# Patient Record
Sex: Male | Born: 1965 | Race: White | Hispanic: No | Marital: Married | Smoking: Former smoker
Health system: Southern US, Community
[De-identification: ages and names within clinical notes are randomized; demographics above are authoritative.]

## PROBLEM LIST (undated history)

## (undated) DIAGNOSIS — F419 Anxiety disorder, unspecified: Secondary | ICD-10-CM

## (undated) DIAGNOSIS — D649 Anemia, unspecified: Secondary | ICD-10-CM

## (undated) DIAGNOSIS — M1990 Unspecified osteoarthritis, unspecified site: Secondary | ICD-10-CM

## (undated) DIAGNOSIS — I10 Essential (primary) hypertension: Secondary | ICD-10-CM

## (undated) DIAGNOSIS — M549 Dorsalgia, unspecified: Secondary | ICD-10-CM

## (undated) DIAGNOSIS — I2510 Atherosclerotic heart disease of native coronary artery without angina pectoris: Secondary | ICD-10-CM

## (undated) DIAGNOSIS — Z9889 Other specified postprocedural states: Secondary | ICD-10-CM

## (undated) DIAGNOSIS — R112 Nausea with vomiting, unspecified: Secondary | ICD-10-CM

## (undated) DIAGNOSIS — M069 Rheumatoid arthritis, unspecified: Secondary | ICD-10-CM

## (undated) DIAGNOSIS — Z9289 Personal history of other medical treatment: Secondary | ICD-10-CM

## (undated) DIAGNOSIS — E2740 Unspecified adrenocortical insufficiency: Secondary | ICD-10-CM

## (undated) DIAGNOSIS — G6181 Chronic inflammatory demyelinating polyneuritis: Secondary | ICD-10-CM

## (undated) HISTORY — PX: COLONOSCOPY: SHX174

## (undated) HISTORY — DX: Chronic inflammatory demyelinating polyneuritis: G61.81

## (undated) HISTORY — DX: Dorsalgia, unspecified: M54.9

## (undated) HISTORY — PX: CARPAL TUNNEL RELEASE: SHX101

## (undated) HISTORY — PX: BACK SURGERY: SHX140

---

## 1998-10-05 HISTORY — PX: CARPAL TUNNEL RELEASE: SHX101

## 1999-10-06 HISTORY — PX: HERNIA REPAIR: SHX51

## 2001-08-09 ENCOUNTER — Encounter: Payer: Self-pay | Admitting: Neurological Surgery

## 2001-08-09 ENCOUNTER — Ambulatory Visit (HOSPITAL_COMMUNITY): Admission: RE | Admit: 2001-08-09 | Discharge: 2001-08-09 | Payer: Self-pay | Admitting: Neurological Surgery

## 2001-08-16 ENCOUNTER — Inpatient Hospital Stay (HOSPITAL_COMMUNITY): Admission: RE | Admit: 2001-08-16 | Discharge: 2001-08-20 | Payer: Self-pay | Admitting: Neurological Surgery

## 2001-08-16 ENCOUNTER — Encounter: Payer: Self-pay | Admitting: Neurological Surgery

## 2004-09-16 ENCOUNTER — Ambulatory Visit: Payer: Self-pay | Admitting: Family Medicine

## 2004-10-27 ENCOUNTER — Ambulatory Visit: Payer: Self-pay | Admitting: Family Medicine

## 2004-11-27 ENCOUNTER — Ambulatory Visit: Payer: Self-pay | Admitting: Family Medicine

## 2008-07-16 HISTORY — PX: COLONOSCOPY: SHX174

## 2008-07-16 HISTORY — PX: ESOPHAGOGASTRODUODENOSCOPY: SHX1529

## 2012-10-05 DIAGNOSIS — Z9289 Personal history of other medical treatment: Secondary | ICD-10-CM

## 2012-10-05 HISTORY — DX: Personal history of other medical treatment: Z92.89

## 2014-04-11 ENCOUNTER — Other Ambulatory Visit (HOSPITAL_COMMUNITY): Payer: Self-pay | Admitting: Rheumatology

## 2014-04-11 ENCOUNTER — Ambulatory Visit (HOSPITAL_COMMUNITY)
Admission: RE | Admit: 2014-04-11 | Discharge: 2014-04-11 | Disposition: A | Discharge status: Home / Self Care | Payer: BC Managed Care – PPO | Source: Ambulatory Visit | Attending: Rheumatology | Admitting: Rheumatology

## 2014-04-11 DIAGNOSIS — M7989 Other specified soft tissue disorders: Secondary | ICD-10-CM | POA: Insufficient documentation

## 2014-04-11 DIAGNOSIS — R52 Pain, unspecified: Secondary | ICD-10-CM

## 2014-04-11 IMAGING — CR DG CHEST 2V
2 series · 2 of 2 positions shown · non-contrast
Comparison: Single view of the chest [DATE] and [DATE].

CLINICAL DATA: Left hand swelling.  Screening prior drug therapy.

EXAM:
CHEST  2 VIEW

[w chest pa]
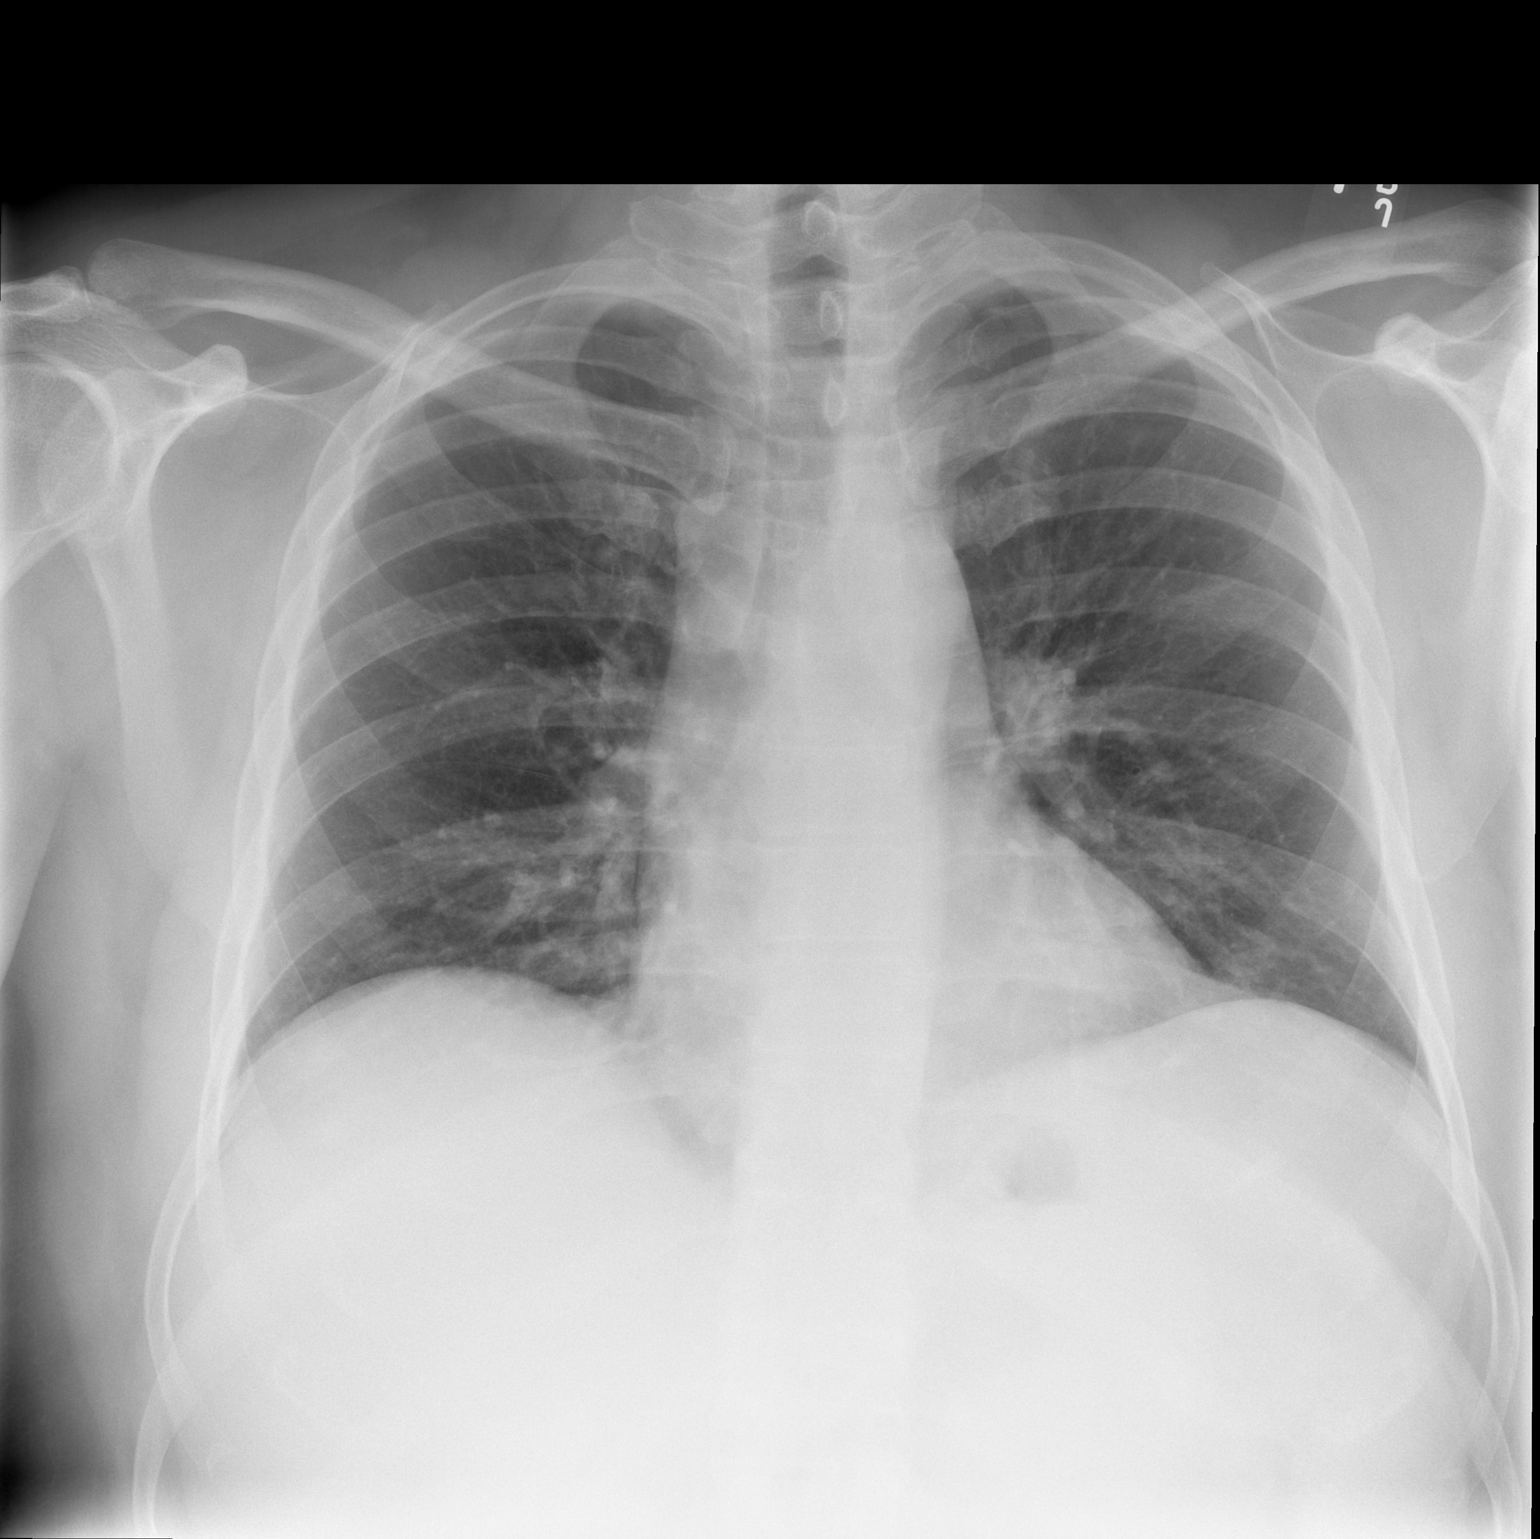

[w chest lat]
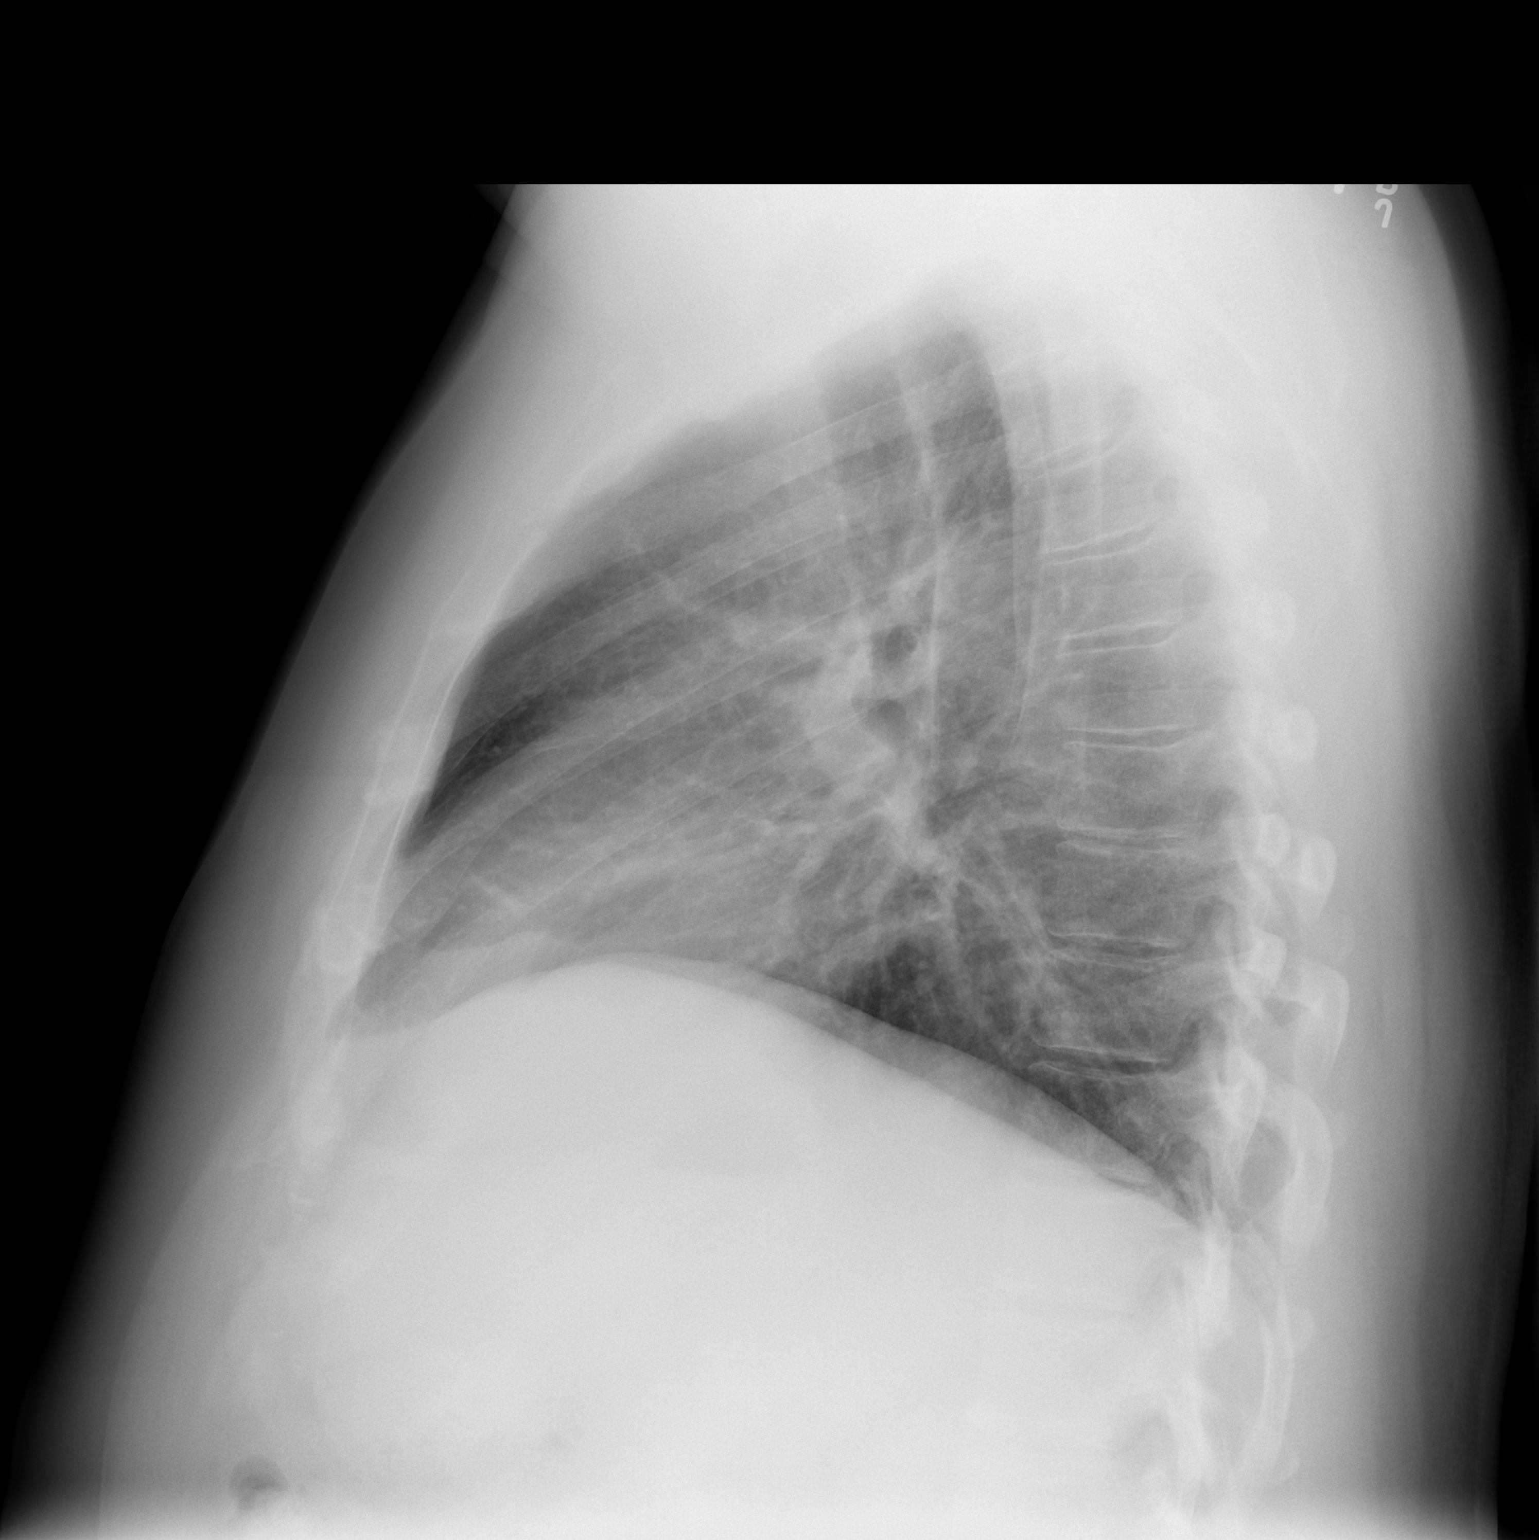

[2 of 2 positions shown; findings below may reference images not displayed]

FINDINGS: Heart size and mediastinal contours are within normal limits. Both
lungs are clear. Visualized skeletal structures are unremarkable.
IMPRESSION: Negative exam.

## 2015-10-28 ENCOUNTER — Other Ambulatory Visit: Payer: Self-pay | Admitting: Neurological Surgery

## 2015-12-09 ENCOUNTER — Other Ambulatory Visit (HOSPITAL_COMMUNITY): Payer: Self-pay | Admitting: *Deleted

## 2015-12-09 ENCOUNTER — Encounter (HOSPITAL_COMMUNITY): Payer: Self-pay

## 2015-12-09 ENCOUNTER — Encounter (HOSPITAL_COMMUNITY)
Admission: RE | Admit: 2015-12-09 | Discharge: 2015-12-09 | Disposition: A | Discharge status: Home / Self Care | Payer: BLUE CROSS/BLUE SHIELD | Source: Ambulatory Visit | Attending: Neurological Surgery | Admitting: Neurological Surgery

## 2015-12-09 DIAGNOSIS — Z0183 Encounter for blood typing: Secondary | ICD-10-CM | POA: Diagnosis not present

## 2015-12-09 DIAGNOSIS — Z01812 Encounter for preprocedural laboratory examination: Secondary | ICD-10-CM | POA: Diagnosis not present

## 2015-12-09 DIAGNOSIS — M4806 Spinal stenosis, lumbar region: Secondary | ICD-10-CM | POA: Diagnosis not present

## 2015-12-09 HISTORY — DX: Anxiety disorder, unspecified: F41.9

## 2015-12-09 HISTORY — DX: Personal history of other medical treatment: Z92.89

## 2015-12-09 HISTORY — DX: Unspecified osteoarthritis, unspecified site: M19.90

## 2015-12-09 LAB — CBC
HCT: 46.7 % (ref 39.0–52.0)
HEMOGLOBIN: 15.2 g/dL (ref 13.0–17.0)
MCH: 28 pg (ref 26.0–34.0)
MCHC: 32.5 g/dL (ref 30.0–36.0)
MCV: 86.2 fL (ref 78.0–100.0)
PLATELETS: 221 10*3/uL (ref 150–400)
RBC: 5.42 MIL/uL (ref 4.22–5.81)
RDW: 13.1 % (ref 11.5–15.5)
WBC: 7 10*3/uL (ref 4.0–10.5)

## 2015-12-09 LAB — BASIC METABOLIC PANEL
Anion gap: 11 (ref 5–15)
BUN: 16 mg/dL (ref 6–20)
CHLORIDE: 109 mmol/L (ref 101–111)
CO2: 23 mmol/L (ref 22–32)
CREATININE: 0.93 mg/dL (ref 0.61–1.24)
Calcium: 9.2 mg/dL (ref 8.9–10.3)
GFR calc Af Amer: >60 mL/min (ref >60)
GFR calc non Af Amer: >60 mL/min (ref >60)
Glucose, Bld: 99 mg/dL (ref 65–99)
Potassium: 4.1 mmol/L (ref 3.5–5.1)
Sodium: 143 mmol/L (ref 135–145)

## 2015-12-09 LAB — TYPE AND SCREEN
ABO/RH(D): A POS
Antibody Screen: NEGATIVE

## 2015-12-09 LAB — SURGICAL PCR SCREEN
MRSA, PCR: NEGATIVE
Staphylococcus aureus: NEGATIVE

## 2015-12-09 LAB — ABO/RH: ABO/RH(D): A POS

## 2015-12-09 NOTE — Progress Notes (Signed)
PCP- /w [facility], Dr. Salvadore Dom, Rheumatology -Dr. Fatima Sanger, pt. Presented to ED with chest pain in 2014, kept one night & had stress /echo- told it was normal, no further problems or f/u with cardiac.

## 2015-12-09 NOTE — Pre-Procedure Instructions (Signed)
Gregory Hinton  12/09/2015      [facility] - [place], [place] - [address]  [place] [address] Phone: [phone] Fax: [phone]    Your procedure is scheduled on : 12/17/2015.  Report to [facility] Admitting at 5:30 A.M.  Call this number if you have problems the morning of surgery:  [phone]   Remember:  Do not eat food or drink liquids after midnight.  On Monday   Take these medicines the morning of surgery with A SIP OF WATER: Wellbutrin    Do not wear jewelry   Do not wear lotions, powders, or perfumes.  You may wear deodorant.             Men may shave face and neck.   Do not bring valuables to the hospital.   [facility] is not responsible for any belongings or valuables.  Contacts, dentures or bridgework may not be worn into surgery.  Leave your suitcase in the car.  After surgery it may be brought to your room.  For patients admitted to the hospital, discharge time will be determined by your treatment team.  Patients discharged the day of surgery will not be allowed to drive home.   Name and phone number of your driver:   With wife  Special instructions:  Special Instructions: [place] - Preparing for Surgery  Before surgery, you can play an important role.  Because skin is not sterile, your skin needs to be as free of germs as possible.  You can reduce the number of germs on you skin by washing with CHG (chlorahexidine gluconate) soap before surgery.  CHG is an antiseptic cleaner which kills germs and bonds with the skin to continue killing germs even after washing.  Please DO NOT use if you have an allergy to CHG or antibacterial soaps.  If your skin becomes reddened/irritated stop using the CHG and inform your nurse when you arrive at Short Stay.  Do not shave (including legs and underarms) for at least 48 hours prior to the first CHG shower.  You may shave your face.  Please follow  these instructions carefully:   1.  Shower with CHG Soap the night before surgery and the  morning of Surgery.  2.  If you choose to wash your hair, wash your hair first as usual with your  normal shampoo.  3.  After you shampoo, rinse your hair and body thoroughly to remove the  Shampoo.  4.  Use CHG as you would any other liquid soap.  You can apply chg directly to the skin and wash gently with scrungie or a clean washcloth.  5.  Apply the CHG Soap to your body ONLY FROM THE NECK DOWN.    Do not use on open wounds or open sores.  Avoid contact with your eyes, ears, mouth and genitals (private parts).  Wash genitals (private parts)   with your normal soap.  6.  Wash thoroughly, paying special attention to the area where your surgery will be performed.  7.  Thoroughly rinse your body with warm water from the neck down.  8.  DO NOT shower/wash with your normal soap after using and rinsing off   the CHG Soap.  9.  Pat yourself dry with a clean towel.            10.  Wear clean pajamas.  11.  Place clean sheets on your bed the night of your first shower and do not sleep with pets.  Day of Surgery  Do not apply any lotions/deodorants the morning of surgery.  Please wear clean clothes to the hospital/surgery center.  Please read over the following fact sheets that you were given. Pain Booklet, Coughing and Deep Breathing, Blood Transfusion Information, MRSA Information and Surgical Site Infection Prevention

## 2015-12-16 MED ORDER — CEFAZOLIN SODIUM-DEXTROSE 2-3 GM-% IV SOLR
2.0000 g | INTRAVENOUS | Status: AC
  Administered 2015-12-17: 2 g via INTRAVENOUS
  Filled 2015-12-16: qty 50

## 2015-12-16 NOTE — Anesthesia Preprocedure Evaluation (Addendum)
Anesthesia Evaluation  Patient identified by MRN, date of birth, ID band Patient awake    Reviewed: Allergy & Precautions, NPO status , Patient's Chart, lab work & pertinent test results  Airway Mallampati: II   Neck ROM: Full    Dental  (+) Teeth Intact, Dental Advisory Given   Pulmonary former smoker (quit 2013),    breath sounds clear to auscultation       Cardiovascular  Rhythm:Regular  Stress neg 2014   Neuro/Psych Anxiety negative neurological ROS     GI/Hepatic negative GI ROS, Neg liver ROS,   Endo/Other  negative endocrine ROS  Renal/GU negative Renal ROS  negative genitourinary   Musculoskeletal  (+) Arthritis , Rheumatoid disorders,    Abdominal   Peds negative pediatric ROS (+)  Hematology negative hematology ROS (+) 15/46   Anesthesia Other Findings   Reproductive/Obstetrics negative OB ROS                           Anesthesia Physical Anesthesia Plan  ASA: II  Anesthesia Plan: General   Post-op Pain Management:    Induction: Intravenous  Airway Management Planned: Oral ETT  Additional Equipment:   Intra-op Plan:   Post-operative Plan: Extubation in OR  Informed Consent: I have reviewed the patients History and Physical, chart, labs and discussed the procedure including the risks, benefits and alternatives for the proposed anesthesia with the patient or authorized representative who has indicated his/her understanding and acceptance.     Plan Discussed with:   Anesthesia Plan Comments: (Allergy to Eggs causes GI issues, )        Anesthesia Quick Evaluation

## 2015-12-17 ENCOUNTER — Inpatient Hospital Stay (HOSPITAL_COMMUNITY): Payer: BLUE CROSS/BLUE SHIELD | Admitting: Anesthesiology

## 2015-12-17 ENCOUNTER — Encounter (HOSPITAL_COMMUNITY)
Admission: RE | Disposition: A | Discharge status: Home / Self Care | Payer: Self-pay | Source: Ambulatory Visit | Attending: Neurological Surgery

## 2015-12-17 ENCOUNTER — Inpatient Hospital Stay (HOSPITAL_COMMUNITY): Payer: BLUE CROSS/BLUE SHIELD

## 2015-12-17 ENCOUNTER — Inpatient Hospital Stay (HOSPITAL_COMMUNITY)
Admission: RE | Admit: 2015-12-17 | Discharge: 2015-12-19 | DRG: 460 | Disposition: A | Discharge status: Home / Self Care | Payer: BLUE CROSS/BLUE SHIELD | Source: Ambulatory Visit | Attending: Neurological Surgery | Admitting: Neurological Surgery

## 2015-12-17 ENCOUNTER — Encounter (HOSPITAL_COMMUNITY): Payer: Self-pay | Admitting: *Deleted

## 2015-12-17 DIAGNOSIS — Z91018 Allergy to other foods: Secondary | ICD-10-CM

## 2015-12-17 DIAGNOSIS — F419 Anxiety disorder, unspecified: Secondary | ICD-10-CM | POA: Diagnosis present

## 2015-12-17 DIAGNOSIS — Z79899 Other long term (current) drug therapy: Secondary | ICD-10-CM

## 2015-12-17 DIAGNOSIS — Z7952 Long term (current) use of systemic steroids: Secondary | ICD-10-CM

## 2015-12-17 DIAGNOSIS — M069 Rheumatoid arthritis, unspecified: Secondary | ICD-10-CM | POA: Diagnosis present

## 2015-12-17 DIAGNOSIS — Z87891 Personal history of nicotine dependence: Secondary | ICD-10-CM

## 2015-12-17 DIAGNOSIS — Z419 Encounter for procedure for purposes other than remedying health state, unspecified: Secondary | ICD-10-CM

## 2015-12-17 DIAGNOSIS — M4806 Spinal stenosis, lumbar region: Principal | ICD-10-CM | POA: Diagnosis present

## 2015-12-17 DIAGNOSIS — M48062 Spinal stenosis, lumbar region with neurogenic claudication: Secondary | ICD-10-CM | POA: Diagnosis present

## 2015-12-17 HISTORY — DX: Rheumatoid arthritis, unspecified: M06.9

## 2015-12-17 IMAGING — RF DG LUMBAR SPINE 2-3V
1 series · 2 of 2 positions shown · non-contrast
Comparison: None.

CLINICAL DATA: L3-4 posterior interbody fusion lumbar spine.

EXAM:
LUMBAR SPINE - 2-3 VIEW; DG C-ARM 61-120 MIN fluoroscopic time 27
seconds.

[Series 1: run · 2 of 2 slices shown]
[im 1/2]
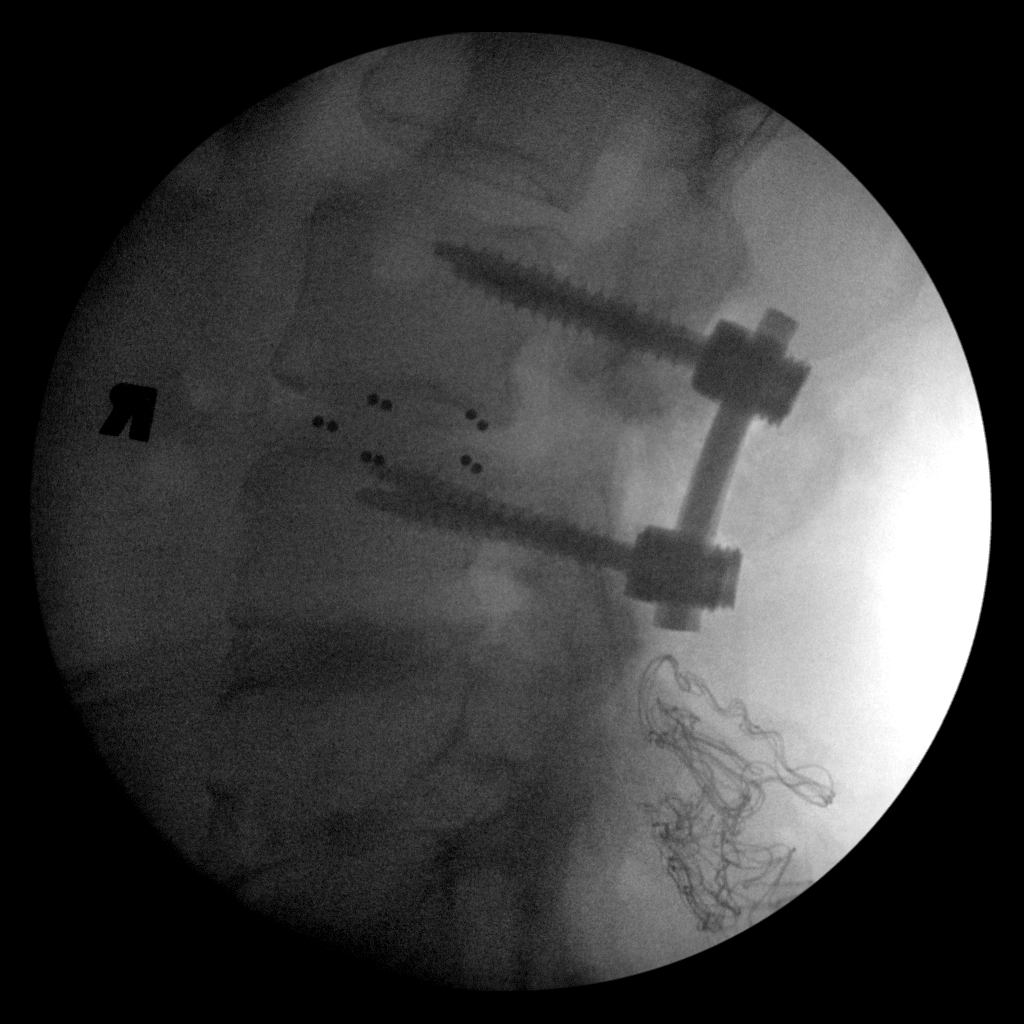
[im 2/2]
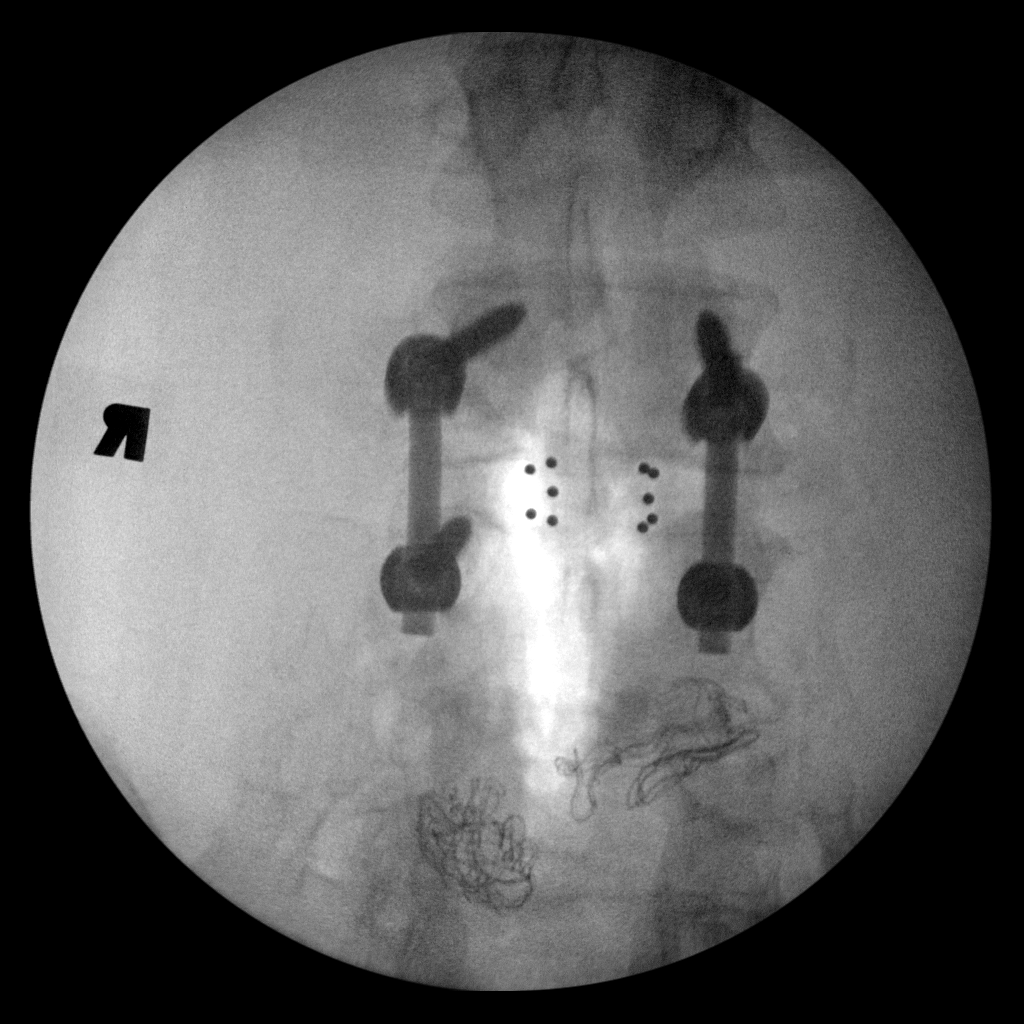

[2 of 2 positions shown; findings below may reference images not displayed]

FINDINGS: Patient status post posterior fusion of the lumbar spine presumably
at 3-4 per history without malalignment.
IMPRESSION: Fluoroscopic images demonstrating posterior fusion of lumbar spine
without malalignment.

## 2015-12-17 SURGERY — POSTERIOR LUMBAR FUSION 1 LEVEL
Anesthesia: General | Site: Spine Lumbar

## 2015-12-17 MED ORDER — DEXAMETHASONE SODIUM PHOSPHATE 10 MG/ML IJ SOLN
INTRAMUSCULAR | Status: DC | PRN
  Administered 2015-12-17: 10 mg via INTRAVENOUS

## 2015-12-17 MED ORDER — ONDANSETRON HCL 4 MG/2ML IJ SOLN: 4.0000 mg | INTRAMUSCULAR | Status: DC | PRN

## 2015-12-17 MED ORDER — PROPOFOL 10 MG/ML IV BOLUS
INTRAVENOUS | Status: DC | PRN
  Administered 2015-12-17: 200 mg via INTRAVENOUS

## 2015-12-17 MED ORDER — ALBUMIN HUMAN 5 % IV SOLN
INTRAVENOUS | Status: DC | PRN
  Administered 2015-12-17: 10:00:00 via INTRAVENOUS

## 2015-12-17 MED ORDER — SODIUM CHLORIDE 0.9 % IR SOLN
Status: DC | PRN
  Administered 2015-12-17: 500 mL

## 2015-12-17 MED ORDER — ROCURONIUM BROMIDE 50 MG/5ML IV SOLN
INTRAVENOUS | Status: AC
  Filled 2015-12-17: qty 1

## 2015-12-17 MED ORDER — PROMETHAZINE HCL 25 MG/ML IJ SOLN
6.2500 mg | INTRAMUSCULAR | Status: DC | PRN
  Administered 2015-12-17: 6.25 mg via INTRAVENOUS

## 2015-12-17 MED ORDER — FENTANYL CITRATE (PF) 100 MCG/2ML IJ SOLN
INTRAMUSCULAR | Status: AC
  Filled 2015-12-17: qty 2

## 2015-12-17 MED ORDER — LIDOCAINE HCL (CARDIAC) 20 MG/ML IV SOLN
INTRAVENOUS | Status: DC | PRN
  Administered 2015-12-17: 100 mg via INTRAVENOUS

## 2015-12-17 MED ORDER — PROPOFOL 10 MG/ML IV BOLUS
INTRAVENOUS | Status: AC
  Filled 2015-12-17: qty 20

## 2015-12-17 MED ORDER — PROMETHAZINE HCL 25 MG/ML IJ SOLN
INTRAMUSCULAR | Status: AC
  Filled 2015-12-17: qty 1

## 2015-12-17 MED ORDER — PHENOL 1.4 % MT LIQD: 1.0000 | OROMUCOSAL | Status: DC | PRN

## 2015-12-17 MED ORDER — LIDOCAINE-EPINEPHRINE 1 %-1:100000 IJ SOLN
INTRAMUSCULAR | Status: DC | PRN
  Administered 2015-12-17: 5 mL

## 2015-12-17 MED ORDER — POLYETHYLENE GLYCOL 3350 17 G PO PACK: 17.0000 g | PACK | Freq: Every day | ORAL | Status: DC | PRN

## 2015-12-17 MED ORDER — SUCCINYLCHOLINE CHLORIDE 20 MG/ML IJ SOLN
INTRAMUSCULAR | Status: DC | PRN
  Administered 2015-12-17: 140 mg via INTRAVENOUS

## 2015-12-17 MED ORDER — HYDROCODONE-ACETAMINOPHEN 5-325 MG PO TABS: 1.0000 | ORAL_TABLET | ORAL | Status: DC | PRN

## 2015-12-17 MED ORDER — BISACODYL 10 MG RE SUPP: 10.0000 mg | Freq: Every day | RECTAL | Status: DC | PRN

## 2015-12-17 MED ORDER — KETOROLAC TROMETHAMINE 30 MG/ML IJ SOLN
INTRAMUSCULAR | Status: AC
  Administered 2015-12-17: 15 mg
  Filled 2015-12-17: qty 1

## 2015-12-17 MED ORDER — METHOCARBAMOL 500 MG PO TABS
500.0000 mg | ORAL_TABLET | Freq: Four times a day (QID) | ORAL | Status: DC | PRN
  Administered 2015-12-18 – 2015-12-19 (×5): 500 mg via ORAL
  Filled 2015-12-17 (×5): qty 1

## 2015-12-17 MED ORDER — GLYCOPYRROLATE 0.2 MG/ML IJ SOLN
INTRAMUSCULAR | Status: AC
  Filled 2015-12-17: qty 2

## 2015-12-17 MED ORDER — SUCCINYLCHOLINE CHLORIDE 20 MG/ML IJ SOLN
INTRAMUSCULAR | Status: AC
  Filled 2015-12-17: qty 1

## 2015-12-17 MED ORDER — ALUM & MAG HYDROXIDE-SIMETH 200-200-20 MG/5ML PO SUSP: 30.0000 mL | Freq: Four times a day (QID) | ORAL | Status: DC | PRN

## 2015-12-17 MED ORDER — SODIUM CHLORIDE 0.9% FLUSH
3.0000 mL | Freq: Two times a day (BID) | INTRAVENOUS | Status: DC
  Administered 2015-12-17 – 2015-12-18 (×2): 3 mL via INTRAVENOUS

## 2015-12-17 MED ORDER — SENNA 8.6 MG PO TABS
1.0000 | ORAL_TABLET | Freq: Two times a day (BID) | ORAL | Status: DC
  Administered 2015-12-17 – 2015-12-19 (×4): 8.6 mg via ORAL
  Filled 2015-12-17 (×4): qty 1

## 2015-12-17 MED ORDER — BUPROPION HCL ER (XL) 300 MG PO TB24
300.0000 mg | ORAL_TABLET | Freq: Every day | ORAL | Status: DC
  Administered 2015-12-18 – 2015-12-19 (×2): 300 mg via ORAL
  Filled 2015-12-17 (×3): qty 1

## 2015-12-17 MED ORDER — FENTANYL CITRATE (PF) 250 MCG/5ML IJ SOLN
INTRAMUSCULAR | Status: AC
  Filled 2015-12-17: qty 5

## 2015-12-17 MED ORDER — VECURONIUM BROMIDE 10 MG IV SOLR
INTRAVENOUS | Status: AC
  Filled 2015-12-17: qty 10

## 2015-12-17 MED ORDER — LACTATED RINGERS IV SOLN
INTRAVENOUS | Status: DC | PRN
  Administered 2015-12-17 (×3): via INTRAVENOUS

## 2015-12-17 MED ORDER — STERILE WATER FOR INJECTION IJ SOLN
INTRAMUSCULAR | Status: AC
  Filled 2015-12-17: qty 10

## 2015-12-17 MED ORDER — METHOCARBAMOL 1000 MG/10ML IJ SOLN
500.0000 mg | Freq: Four times a day (QID) | INTRAVENOUS | Status: DC | PRN
  Filled 2015-12-17: qty 5

## 2015-12-17 MED ORDER — ONDANSETRON HCL 4 MG/2ML IJ SOLN
INTRAMUSCULAR | Status: AC
  Filled 2015-12-17: qty 2

## 2015-12-17 MED ORDER — ACETAMINOPHEN 325 MG PO TABS: 650.0000 mg | ORAL_TABLET | ORAL | Status: DC | PRN

## 2015-12-17 MED ORDER — PHENYLEPHRINE 40 MCG/ML (10ML) SYRINGE FOR IV PUSH (FOR BLOOD PRESSURE SUPPORT)
PREFILLED_SYRINGE | INTRAVENOUS | Status: AC
  Filled 2015-12-17: qty 10

## 2015-12-17 MED ORDER — NEOSTIGMINE METHYLSULFATE 10 MG/10ML IV SOLN
INTRAVENOUS | Status: AC
  Filled 2015-12-17: qty 1

## 2015-12-17 MED ORDER — SODIUM CHLORIDE 0.9% FLUSH: 3.0000 mL | INTRAVENOUS | Status: DC | PRN

## 2015-12-17 MED ORDER — MIDAZOLAM HCL 5 MG/5ML IJ SOLN
INTRAMUSCULAR | Status: DC | PRN
  Administered 2015-12-17: 2 mg via INTRAVENOUS

## 2015-12-17 MED ORDER — MEPERIDINE HCL 25 MG/ML IJ SOLN: 6.2500 mg | INTRAMUSCULAR | Status: DC | PRN

## 2015-12-17 MED ORDER — MIDAZOLAM HCL 2 MG/2ML IJ SOLN
INTRAMUSCULAR | Status: AC
  Filled 2015-12-17: qty 2

## 2015-12-17 MED ORDER — SODIUM CHLORIDE 0.9 % IJ SOLN
INTRAMUSCULAR | Status: AC
  Filled 2015-12-17: qty 10

## 2015-12-17 MED ORDER — ONDANSETRON HCL 4 MG/2ML IJ SOLN
INTRAMUSCULAR | Status: DC | PRN
  Administered 2015-12-17: 4 mg via INTRAVENOUS

## 2015-12-17 MED ORDER — HYDROXYCHLOROQUINE SULFATE 200 MG PO TABS
200.0000 mg | ORAL_TABLET | Freq: Two times a day (BID) | ORAL | Status: DC
  Administered 2015-12-17 – 2015-12-19 (×4): 200 mg via ORAL
  Filled 2015-12-17 (×7): qty 1

## 2015-12-17 MED ORDER — GLYCOPYRROLATE 0.2 MG/ML IJ SOLN
INTRAMUSCULAR | Status: DC | PRN
  Administered 2015-12-17: .3 mg via INTRAVENOUS

## 2015-12-17 MED ORDER — SODIUM CHLORIDE 0.9 % IV SOLN: INTRAVENOUS | Status: DC

## 2015-12-17 MED ORDER — ONDANSETRON HCL 4 MG/2ML IJ SOLN
INTRAMUSCULAR | Status: AC
Start: 2015-12-17 — End: 2015-12-17
  Filled 2015-12-17: qty 2

## 2015-12-17 MED ORDER — GLYCOPYRROLATE 0.2 MG/ML IJ SOLN
INTRAMUSCULAR | Status: AC
  Filled 2015-12-17: qty 1

## 2015-12-17 MED ORDER — THROMBIN 5000 UNITS EX SOLR
CUTANEOUS | Status: DC | PRN
  Administered 2015-12-17 (×2): 5 mL via TOPICAL

## 2015-12-17 MED ORDER — FENTANYL CITRATE (PF) 100 MCG/2ML IJ SOLN
25.0000 ug | INTRAMUSCULAR | Status: DC | PRN
  Administered 2015-12-17: 50 ug via INTRAVENOUS

## 2015-12-17 MED ORDER — KETOROLAC TROMETHAMINE 15 MG/ML IJ SOLN
15.0000 mg | Freq: Four times a day (QID) | INTRAMUSCULAR | Status: AC
  Administered 2015-12-17 – 2015-12-18 (×4): 15 mg via INTRAVENOUS
  Filled 2015-12-17 (×4): qty 1

## 2015-12-17 MED ORDER — ACETAMINOPHEN 10 MG/ML IV SOLN
INTRAVENOUS | Status: AC
  Administered 2015-12-17: 1000 mg via INTRAVENOUS
  Filled 2015-12-17: qty 100

## 2015-12-17 MED ORDER — MORPHINE SULFATE (PF) 2 MG/ML IV SOLN
1.0000 mg | INTRAVENOUS | Status: DC | PRN
  Administered 2015-12-17: 4 mg via INTRAVENOUS

## 2015-12-17 MED ORDER — THROMBIN 20000 UNITS EX SOLR
CUTANEOUS | Status: DC | PRN
  Administered 2015-12-17: 20 mL via TOPICAL

## 2015-12-17 MED ORDER — ACETAMINOPHEN 650 MG RE SUPP: 650.0000 mg | RECTAL | Status: DC | PRN

## 2015-12-17 MED ORDER — FENTANYL CITRATE (PF) 100 MCG/2ML IJ SOLN
INTRAMUSCULAR | Status: DC | PRN
Start: 2015-12-17 — End: 2015-12-17
  Administered 2015-12-17: 50 ug via INTRAVENOUS
  Administered 2015-12-17: 100 ug via INTRAVENOUS
  Administered 2015-12-17 (×4): 50 ug via INTRAVENOUS
  Administered 2015-12-17: 100 ug via INTRAVENOUS

## 2015-12-17 MED ORDER — DEXAMETHASONE SODIUM PHOSPHATE 10 MG/ML IJ SOLN
INTRAMUSCULAR | Status: AC
  Filled 2015-12-17: qty 1

## 2015-12-17 MED ORDER — BUPIVACAINE HCL (PF) 0.5 % IJ SOLN
INTRAMUSCULAR | Status: DC | PRN
  Administered 2015-12-17: 20 mL
  Administered 2015-12-17: 5 mL

## 2015-12-17 MED ORDER — LIDOCAINE HCL (CARDIAC) 20 MG/ML IV SOLN
INTRAVENOUS | Status: AC
  Filled 2015-12-17: qty 5

## 2015-12-17 MED ORDER — DOCUSATE SODIUM 100 MG PO CAPS
100.0000 mg | ORAL_CAPSULE | Freq: Two times a day (BID) | ORAL | Status: DC
  Administered 2015-12-17 – 2015-12-19 (×4): 100 mg via ORAL
  Filled 2015-12-17 (×4): qty 1

## 2015-12-17 MED ORDER — MENTHOL 3 MG MT LOZG: 1.0000 | LOZENGE | OROMUCOSAL | Status: DC | PRN

## 2015-12-17 MED ORDER — NEOSTIGMINE METHYLSULFATE 10 MG/10ML IV SOLN
INTRAVENOUS | Status: DC | PRN
  Administered 2015-12-17: 3 mg via INTRAVENOUS

## 2015-12-17 MED ORDER — ROCURONIUM BROMIDE 100 MG/10ML IV SOLN
INTRAVENOUS | Status: DC | PRN
  Administered 2015-12-17: 5 mg via INTRAVENOUS
  Administered 2015-12-17: 45 mg via INTRAVENOUS

## 2015-12-17 MED ORDER — 0.9 % SODIUM CHLORIDE (POUR BTL) OPTIME
TOPICAL | Status: DC | PRN
  Administered 2015-12-17: 1000 mL

## 2015-12-17 MED ORDER — EPHEDRINE SULFATE 50 MG/ML IJ SOLN
INTRAMUSCULAR | Status: AC
  Filled 2015-12-17: qty 1

## 2015-12-17 MED ORDER — SODIUM CHLORIDE 0.9 % IV SOLN: 250.0000 mL | INTRAVENOUS | Status: DC

## 2015-12-17 MED ORDER — VECURONIUM BROMIDE 10 MG IV SOLR
INTRAVENOUS | Status: DC | PRN
  Administered 2015-12-17: 3 mg via INTRAVENOUS

## 2015-12-17 MED ORDER — CEFAZOLIN SODIUM 1-5 GM-% IV SOLN
1.0000 g | Freq: Three times a day (TID) | INTRAVENOUS | Status: AC
  Administered 2015-12-17 (×2): 1 g via INTRAVENOUS
  Filled 2015-12-17 (×2): qty 50

## 2015-12-17 MED ORDER — OXYCODONE-ACETAMINOPHEN 5-325 MG PO TABS
1.0000 | ORAL_TABLET | ORAL | Status: DC | PRN
  Administered 2015-12-17: 1 via ORAL
  Administered 2015-12-18 – 2015-12-19 (×10): 2 via ORAL
  Filled 2015-12-17 (×11): qty 2

## 2015-12-17 SURGICAL SUPPLY — 70 items
ADH SKN CLS APL DERMABOND .7 (GAUZE/BANDAGES/DRESSINGS) ×1
BAG DECANTER FOR FLEXI CONT (MISCELLANEOUS) ×2 IMPLANT
BLADE CLIPPER SURG (BLADE) ×1 IMPLANT
BONE CANC CHIPS 20CC PCAN1/4 (Bone Implant) ×2 IMPLANT
BUR MATCHSTICK NEURO 3.0 LAGG (BURR) ×2 IMPLANT
CAGE COROENT LRG 9X9X28-8 (Cage) ×2 IMPLANT
CANISTER SUCT 3000ML PPV (MISCELLANEOUS) ×2 IMPLANT
CHIPS CANC BONE 20CC PCAN1/4 (Bone Implant) ×1 IMPLANT
CONT SPEC 4OZ CLIKSEAL STRL BL (MISCELLANEOUS) ×2 IMPLANT
COVER BACK TABLE 60X90IN (DRAPES) ×2 IMPLANT
DECANTER SPIKE VIAL GLASS SM (MISCELLANEOUS) ×2 IMPLANT
DERMABOND ADVANCED (GAUZE/BANDAGES/DRESSINGS) ×1
DERMABOND ADVANCED .7 DNX12 (GAUZE/BANDAGES/DRESSINGS) ×1 IMPLANT
DEVICE DISSECT PLASMABLAD 3.0S (MISCELLANEOUS) IMPLANT
DRAPE C-ARM 42X72 X-RAY (DRAPES) ×4 IMPLANT
DRAPE LAPAROTOMY 100X72X124 (DRAPES) ×2 IMPLANT
DRAPE POUCH INSTRU U-SHP 10X18 (DRAPES) ×2 IMPLANT
DRAPE PROXIMA HALF (DRAPES) ×2 IMPLANT
DRSG OPSITE POSTOP 4X8 (GAUZE/BANDAGES/DRESSINGS) ×1 IMPLANT
DURAPREP 26ML APPLICATOR (WOUND CARE) ×2 IMPLANT
ELECT REM PT RETURN 9FT ADLT (ELECTROSURGICAL) ×2
ELECTRODE REM PT RTRN 9FT ADLT (ELECTROSURGICAL) ×1 IMPLANT
GAUZE SPONGE 4X4 12PLY STRL (GAUZE/BANDAGES/DRESSINGS) ×1 IMPLANT
GAUZE SPONGE 4X4 16PLY XRAY LF (GAUZE/BANDAGES/DRESSINGS) ×1 IMPLANT
GLOVE BIOGEL PI IND STRL 7.0 (GLOVE) IMPLANT
GLOVE BIOGEL PI IND STRL 7.5 (GLOVE) IMPLANT
GLOVE BIOGEL PI IND STRL 8.5 (GLOVE) ×2 IMPLANT
GLOVE BIOGEL PI INDICATOR 7.0 (GLOVE) ×4
GLOVE BIOGEL PI INDICATOR 7.5 (GLOVE) ×2
GLOVE BIOGEL PI INDICATOR 8.5 (GLOVE) ×3
GLOVE ECLIPSE 7.0 STRL STRAW (GLOVE) ×1 IMPLANT
GLOVE ECLIPSE 8.5 STRL (GLOVE) ×5 IMPLANT
GLOVE EXAM NITRILE LRG STRL (GLOVE) IMPLANT
GLOVE EXAM NITRILE MD LF STRL (GLOVE) IMPLANT
GLOVE EXAM NITRILE XL STR (GLOVE) IMPLANT
GLOVE EXAM NITRILE XS STR PU (GLOVE) IMPLANT
GOWN STRL REUS W/ TWL LRG LVL3 (GOWN DISPOSABLE) IMPLANT
GOWN STRL REUS W/ TWL XL LVL3 (GOWN DISPOSABLE) IMPLANT
GOWN STRL REUS W/TWL 2XL LVL3 (GOWN DISPOSABLE) ×4 IMPLANT
GOWN STRL REUS W/TWL LRG LVL3 (GOWN DISPOSABLE) ×4
GOWN STRL REUS W/TWL XL LVL3 (GOWN DISPOSABLE)
GRAFT BNE CANC CHIPS 1-8 20CC (Bone Implant) IMPLANT
HEMOSTAT POWDER KIT SURGIFOAM (HEMOSTASIS) ×2 IMPLANT
KIT BASIN OR (CUSTOM PROCEDURE TRAY) ×2 IMPLANT
KIT ROOM TURNOVER OR (KITS) ×2 IMPLANT
MILL MEDIUM DISP (BLADE) ×2 IMPLANT
MODULE POWER NUVASIVE (MISCELLANEOUS) IMPLANT
NEEDLE HYPO 22GX1.5 SAFETY (NEEDLE) ×3 IMPLANT
NS IRRIG 1000ML POUR BTL (IV SOLUTION) ×2 IMPLANT
PACK LAMINECTOMY NEURO (CUSTOM PROCEDURE TRAY) ×2 IMPLANT
PAD ARMBOARD 7.5X6 YLW CONV (MISCELLANEOUS) ×8 IMPLANT
PATTIES SURGICAL .5 X1 (DISPOSABLE) ×1 IMPLANT
PLASMABLADE 3.0S (MISCELLANEOUS) ×2
POWER MODULE NUVASIVE (MISCELLANEOUS) ×2
ROD RELINE-O LORD 5.5X50MM (Rod) ×2 IMPLANT
SCREW LOCK RELINE 5.5 TULIP (Screw) ×4 IMPLANT
SCREW RELINE-O POLY 6.5X45 (Screw) ×8 IMPLANT
SPONGE LAP 4X18 X RAY DECT (DISPOSABLE) IMPLANT
SPONGE SURGIFOAM ABS GEL 100 (HEMOSTASIS) ×2 IMPLANT
SUT VIC AB 1 CT1 18XBRD ANBCTR (SUTURE) ×1 IMPLANT
SUT VIC AB 1 CT1 8-18 (SUTURE) ×4
SUT VIC AB 2-0 CP2 18 (SUTURE) ×3 IMPLANT
SUT VIC AB 3-0 SH 8-18 (SUTURE) ×2 IMPLANT
SYR 3ML LL SCALE MARK (SYRINGE) ×8 IMPLANT
TOWEL OR 17X24 6PK STRL BLUE (TOWEL DISPOSABLE) ×2 IMPLANT
TOWEL OR 17X26 10 PK STRL BLUE (TOWEL DISPOSABLE) ×2 IMPLANT
TRAP SPECIMEN MUCOUS 40CC (MISCELLANEOUS) ×2 IMPLANT
TRAY FOLEY CATH 16FRSI W/METER (SET/KITS/TRAYS/PACK) ×2 IMPLANT
TRAY FOLEY W/METER SILVER 14FR (SET/KITS/TRAYS/PACK) IMPLANT
WATER STERILE IRR 1000ML POUR (IV SOLUTION) ×2 IMPLANT

## 2015-12-17 NOTE — Transfer of Care (Signed)
Immediate Anesthesia Transfer of Care Note  Patient: Gregory Hinton  Procedure(s) Performed: Procedure(s): LUMBAR THREE-FOUR POSTERIOR LUMBAR INTERBODY FUSION (N/A)  Patient Location: PACU  Anesthesia Type:General  Level of Consciousness: awake, oriented and patient cooperative  Airway & Oxygen Therapy: Patient Spontanous Breathing and Patient connected to nasal cannula oxygen  Post-op Assessment: Report given to RN and Post -op Vital signs reviewed and stable  Post vital signs: Reviewed  Last Vitals:  Filed Vitals:   12/17/15 0608  BP: 138/88  Pulse: 93  Temp: 36.7 C  Resp: 20    Complications: No apparent anesthesia complications

## 2015-12-17 NOTE — Anesthesia Procedure Notes (Signed)
Procedure Name: Intubation Date/Time: 12/17/2015 7:33 AM Performed by: Lovie Chol Pre-anesthesia Checklist: Patient identified, Emergency Drugs available, Suction available, Patient being monitored and Timeout performed Patient Re-evaluated:Patient Re-evaluated prior to inductionOxygen Delivery Method: Circle system utilized Preoxygenation: Pre-oxygenation with 100% oxygen Intubation Type: IV induction Ventilation: Mask ventilation without difficulty Laryngoscope Size: Miller and 3 Grade View: Grade II Tube type: Oral Tube size: 7.5 mm Number of attempts: 1 Airway Equipment and Method: Stylet Placement Confirmation: ETT inserted through vocal cords under direct vision,  positive ETCO2,  CO2 detector and breath sounds checked- equal and bilateral Secured at: 22 cm Tube secured with: Tape Dental Injury: Teeth and Oropharynx as per pre-operative assessment

## 2015-12-17 NOTE — H&P (Signed)
Gregory Hinton is an 50 y.o. male.   Chief Complaint: Spondylolisthesis and stenosis L3-L4, status post decompression and arthrodesis L4-L5 HPI: Patient is a 50 year old individual who's had significant degenerative changes in the lower lumbar spine with a spondylolisthesis at L4-L5 that was decompressed and fused approximately 14 years ago. He did well after that surgery but lately has had increasing back pain. A few years back use diagnosed with rheumatoid arthritis. He felt his pain was related to his rheumatoid arthritis but when the situation continually got worse he ultimately had workup which disclosed the presence of a degenerative change at L3-L4 above his previous fusion. Further workup demonstrated that he had a severe stenosis at L3-L4. A ring failed efforts at conservative management, he's been advised regarding the need for surgery.  Past Medical History  Diagnosis Date  . H/O cardiovascular stress test 2014    admitted at hosp. overnight for chest pain, stress/echo test wnl  . Anxiety   . Arthritis   . Rheumatoid arthritis (HCC)     manages by Dr. Corliss Skains, rec'd Orencia weekly for 2 months, switched from Humira     Past Surgical History  Procedure Laterality Date  . Back surgery  5284,[id number]    lumbar fusion   . Carpal tunnel release Right 2000  . Hernia repair Right 2001    inguinal     History reviewed. No pertinent family history. Social History:  reports that he has quit smoking. He quit smokeless tobacco use about 4 years ago. He reports that he drinks alcohol. He reports that he does not use illicit drugs.  Allergies:  Allergies  Allergen Reactions  . Eggs Or Egg-Derived Products Diarrhea    Medications Prior to Admission  Medication Sig Dispense Refill  . Abatacept (ORENCIA) 125 MG/ML SOSY Inject into the skin once a week. Monday    . buPROPion (WELLBUTRIN XL) 300 MG 24 hr tablet Take 300 mg by mouth daily.    . cholecalciferol (VITAMIN D) 1000 units  tablet Take 1,000 Units by mouth 2 (two) times daily.    . folic acid (FOLVITE) 1 MG tablet Take 1 mg by mouth 2 (two) times daily.    . hydroxychloroquine (PLAQUENIL) 200 MG tablet Take 200 mg by mouth 2 (two) times daily.    Marland Kitchen ibuprofen (ADVIL,MOTRIN) 200 MG tablet Take 400 mg by mouth every 8 (eight) hours as needed for mild pain or moderate pain.    Marland Kitchen leflunomide (ARAVA) 20 MG tablet Take 20 mg by mouth every morning.    . predniSONE (DELTASONE) 5 MG tablet Take 5 mg by mouth daily with breakfast.      No results found for this or any previous visit (from the past 48 hour(s)). No results found.  Review of Systems  HENT: Negative.   Eyes: Negative.   Respiratory: Negative.   Cardiovascular: Negative.   Gastrointestinal: Negative.   Genitourinary: Negative.   Musculoskeletal: Positive for back pain.       History of rheumatoid arthritis  Skin: Negative.   Neurological: Positive for tingling and weakness.  Psychiatric/Behavioral: Negative.     Blood pressure 138/88, pulse 93, temperature 98 F (36.7 C), temperature source Oral, resp. rate 20, weight 117.935 kg (260 lb), SpO2 98 %. Physical Exam  Constitutional: He is oriented to person, place, and time. He appears well-developed and well-nourished.  HENT:  Head: Normocephalic and atraumatic.  Eyes: Conjunctivae and EOM are normal. Pupils are equal, round, and reactive to light.  Neck:  Decreased range of motion of his neck  Cardiovascular: Normal rate and regular rhythm.   Respiratory: Effort normal and breath sounds normal.  GI: Soft. Bowel sounds are normal.  Musculoskeletal:  Centralized back pain positive straight leg raising at 30 in either lower extremity Patrick's maneuver is negative  Neurological: He is alert and oriented to person, place, and time.  Absent deep tendon reflexes in upper and lower extremities  Skin: Skin is warm and dry.  Psychiatric: He has a normal mood and affect. His behavior is normal.  Judgment and thought content normal.     Assessment/Plan Spondylolisthesis with stenosis L3-L4, status post posterior decompression arthrodesis L4-L5. Neurogenic claudication, lumbar radiculopathy  Decompression and fusion L3-L4  Stefani Dama, MD 12/17/2015, 7:21 AM

## 2015-12-17 NOTE — Op Note (Signed)
Date of surgery: 12/17/2015 Preoperative diagnosis: L3-4 spondylolisthesis status post decompression arthrodesis L4 to the sacrum, lumbar radiculopathy, lumbar stenosis, neurogenic claudication Postoperative diagnosis: The same Procedure: Removal of hardware from L4 to the sacrum, decompression of L3-L4 with more work than required for simple interbody arthrodesis decompression of L3 and L4 nerve roots individually and bilaterally posterior lumbar interbody arthrodesis using peek spacers local autograft and allograft L3-4 posterior lateral arthrodesis with local autograft and allograft L3-4 pedicle screw fixation L3-4.  Surgeon: Barnett Abu M.D. First assistant: Dr. Freda Jackson M.D. Anesthesia: Gen. 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 Patient did well for while in the past few years he developed rheumatoid arthritis. He's been treated for this medically but has had increasing back pain and leg pain. A recent MRI and plain x-rays demonstrate studies developed adjacent level spondylolisthesis with severe stenosis at L3-L4. Is a advised regarding the need for surgery to decompress and stabilize L3-4.  Procedure: The patient was brought to the operating room supine on a stretcher. After the smooth induction of general endotracheal anesthesia, he was turned prone. The back was prepped without wall DuraPrep and draped in a sterile fashion. The old incision was opened towards the superior aspect of it. The dissection was carried down to the lumbar dorsal fascia which was opened on either side of midline. The old hardware was identified. There is noted to be little room between the screws at L4 and L5. At L5-S1 the distance with small also. Is decided then to remove the old  hardware. This was done with a minimum of difficulty. Then the dissection was carried cephalad to expose the L3-L4 interspace. The soft tissues and lateral gutters were were dissected and the inferior margin laminar arch of L3 was then removed 6 floor the common dural tube and the thick and redundant ligament that was in this region. This was excised and allowed for exploration of the common dural tube and then decompression of the L4 nerve root inferiorly. This was done with a series of 2 and 3 Mill meter Kerrison punches and high-speed drill. Then the path of the L3 nerve root superiorly was decompressed the same combination of tools. Once the nerve root decompression was completed, the disc space was explored. Total discectomy was then performed using accommodation of curettes and rongeurs and 789 mm shavers to remove a substantial quantity of severely degenerated and desiccated disc material from within the disc space at L3-4. Once this was accomplished interspace was sized for appropriate size spacer was felt that a 10 mm tall 28 mm long 8 lordotic spacer would fit best into this interval. This was packed with accommodation of autograft and allograft any interspace was packed with a total of 9 mL of autograft and allograft. Once the spacers were placed radiographic confirmation of their position was obtained. Adequate entry sites were then chosen at L3. 6.5 x 45 mm screws were then passed through the lateral aspects of the pedicle into the vertebral bodies under fluoroscopic guidance. Care was taken to make sure that there was no cut out of the screws. 6.5 x 45 mm screws were placed in L4. Short 40 mm precontoured rods were then used to connect the construct together meanwhile some extension was dialed into the back so as to create greater lordosis. The screws were then  tightened in a neutral construct. Lateral gutters were packed with remainder of bone in autograft allograft combination at L3-L4. Once this  was completed the wound was inspected carefully the pads of the L3 nerve root superiorly and the L4 nerve root inferiorly were checked to make sure there were adequately decompressed and no bony chips had lodged themselves around the pads of the nerves the disc spaces were checked for no bony egress from the interspace and hemostasis and the soft tissues is also checked. Once this was verified the lumbar dorsal fascia was reapproximated with #1 Vicryl in a interrupted fashion. 2-0 Vicryl was used subcutaneously. 20 mL of half percent Marcaine was injected into the paraspinous fascia. 3-0 Vicryl was used to close the subcutaneous take her skin. Dermabond was placed on the skin.

## 2015-12-17 NOTE — Anesthesia Postprocedure Evaluation (Signed)
Anesthesia Post Note  Patient: Gregory Hinton  Procedure(s) Performed: Procedure(s) (LRB): LUMBAR THREE-FOUR POSTERIOR LUMBAR INTERBODY FUSION (N/A)  Patient location during evaluation: PACU Anesthesia Type: General Level of consciousness: awake and alert and oriented Pain management: pain level controlled Vital Signs Assessment: post-procedure vital signs reviewed and stable Respiratory status: spontaneous breathing, nonlabored ventilation, respiratory function stable and patient connected to nasal cannula oxygen Cardiovascular status: blood pressure returned to baseline and stable Postop Assessment: no signs of nausea or vomiting Anesthetic complications: no Comments: Red areas on chin and brow are resolving.    Last Vitals:  Filed Vitals:   12/17/15 0608 12/17/15 1135  BP: 138/88   Pulse: 93   Temp: 36.7 C 36.5 C  Resp: 20 16    Last Pain:  Filed Vitals:   12/17/15 1153  PainSc: 4                  Sebastian Ache

## 2015-12-18 LAB — BASIC METABOLIC PANEL
Anion gap: 9 (ref 5–15)
BUN: 10 mg/dL (ref 6–20)
CALCIUM: 8.5 mg/dL — AB (ref 8.9–10.3)
CHLORIDE: 106 mmol/L (ref 101–111)
CO2: 24 mmol/L (ref 22–32)
CREATININE: 0.86 mg/dL (ref 0.61–1.24)
GFR calc Af Amer: >60 mL/min (ref >60)
GFR calc non Af Amer: >60 mL/min (ref >60)
GLUCOSE: 123 mg/dL — AB (ref 65–99)
Potassium: 3.2 mmol/L — ABNORMAL LOW (ref 3.5–5.1)
Sodium: 139 mmol/L (ref 135–145)

## 2015-12-18 LAB — CBC
HEMATOCRIT: 33.9 % — AB (ref 39.0–52.0)
HEMOGLOBIN: 11.4 g/dL — AB (ref 13.0–17.0)
MCH: 28.6 pg (ref 26.0–34.0)
MCHC: 33.6 g/dL (ref 30.0–36.0)
MCV: 85 fL (ref 78.0–100.0)
Platelets: 171 10*3/uL (ref 150–400)
RBC: 3.99 MIL/uL — ABNORMAL LOW (ref 4.22–5.81)
RDW: 13 % (ref 11.5–15.5)
WBC: 11.9 10*3/uL — ABNORMAL HIGH (ref 4.0–10.5)

## 2015-12-18 MED ORDER — DEXAMETHASONE 4 MG PO TABS
2.0000 mg | ORAL_TABLET | Freq: Two times a day (BID) | ORAL | Status: DC
  Administered 2015-12-18 – 2015-12-19 (×3): 2 mg via ORAL
  Filled 2015-12-18 (×3): qty 1

## 2015-12-18 NOTE — Evaluation (Signed)
Occupational Therapy Evaluation Patient Details Name: Gregory Hinton MRN: [id number] DOB: October 09, 1965 Today's Date: 12/18/2015    History of Present Illness Removal of hardware from L4 to the sacrum, decompression of L3-L4, fusion L3-4. 3 prior back sxs   Clinical Impression   This 50 yo male admitted with above presents to acute OT with deficits below affecting his ability to be Mod I/independent with his basic ADLs as he was pta. Currently he reports he is also off of his meds for RA which does not help his pain and makes all of his other joints more sore as well. He will benefit from acute OT without need for follow up.    Follow Up Recommendations  No OT follow up    Equipment Recommendations  3 in 1 bedside comode       Precautions / Restrictions Precautions Precautions: Back Precaution Booklet Issued: Yes (comment) Required Braces or Orthoses: Spinal Brace Spinal Brace: Lumbar corset;Applied in sitting position Restrictions Weight Bearing Restrictions: No      Mobility Bed Mobility Overal bed mobility: Modified Independent             General bed mobility comments: Increased time. Good demonstration of log roll technique.   Transfers Overall transfer level: Needs assistance Equipment used: Rolling walker (2 wheeled) Transfers: Sit to/from Stand Sit to Stand: Supervision         General transfer comment: Supervision for safety. Pt stood to RW but did not require UE support.          ADL Overall ADL's : Needs assistance/impaired Eating/Feeding: Independent;Sitting   Grooming: Supervision/safety;Set up;Standing   Upper Body Bathing: Set up;Supervision/ safety;Sitting   Lower Body Bathing: Moderate assistance (min guard A sit<>stand)   Upper Body Dressing : Set up;Supervision/safety;Sitting   Lower Body Dressing: Maximal assistance (min guard A sit<>stand)   Toilet Transfer: Min guard;Ambulation;RW;BSC (over toilet)   Toileting- Clothing  Manipulation and Hygiene: Supervision/safety (min guard A sit<>stand)         General ADL Comments: Educated pt on using 2 cups when brushing teeth to avoid bending over the sink (one to spit in and one to rinse with). Educated pt on use of wet wipes for back peri-care and how to get a little more reach around by bringing opppsite arm to side of trunk. Also educated pt on sit to stand with legs wider apart and external rotation of hips to be able to sit lower and still maintian back  precautions               Pertinent Vitals/Pain Pain Assessment: 0-10 Pain Score: 5  Pain Location: back Pain Descriptors / Indicators: Guarding;Operative site guarding Pain Intervention(s): Limited activity within patient's tolerance;Monitored during session;Repositioned     Hand Dominance Right   Extremity/Trunk Assessment Upper Extremity Assessment Upper Extremity Assessment: Overall WFL for tasks assessed   Lower Extremity Assessment Lower Extremity Assessment: Overall WFL for tasks assessed   Cervical / Trunk Assessment Cervical / Trunk Assessment: Other exceptions Cervical / Trunk Exceptions: Surgical incision lumbar spine   Communication Communication Communication: No difficulties   Cognition Arousal/Alertness: Awake/alert Behavior During Therapy: WFL for tasks assessed/performed Overall Cognitive Status: Within Functional Limits for tasks assessed                                Home Living Family/patient expects to be discharged to:: Private residence Living Arrangements: Spouse/significant other Available Help at Discharge:  Family;Available 24 hours/day Type of Home: House Home Access: Stairs to enter;Ramped entrance     Home Layout: Multi-level;Able to live on main level with bedroom/bathroom     Bathroom Shower/Tub: Producer, television/film/video: Standard Bathroom Accessibility: Yes   Home Equipment: Environmental consultant - 2 wheels;Shower seat - built in           Prior Functioning/Environment Level of Independence: Independent             OT Diagnosis: Generalized weakness;Acute pain   OT Problem List: Decreased strength;Impaired balance (sitting and/or standing);Decreased knowledge of use of DME or AE;Pain   OT Treatment/Interventions: Self-care/ADL training;Patient/family education;DME and/or AE instruction    OT Goals(Current goals can be found in the care plan section) Acute Rehab OT Goals Patient Stated Goal: home tomorrow OT Goal Formulation: With patient Time For Goal Achievement: 12/25/15 Potential to Achieve Goals: Good  OT Frequency: Min 2X/week           Co-evaluation PT/OT/SLP Co-Evaluation/Treatment: Yes     OT goals addressed during session: ADL's and self-care;Strengthening/ROM      End of Session Equipment Utilized During Treatment: Back brace Nurse Communication:  (nurse already aware of how pt moving)  Activity Tolerance: Patient tolerated treatment well Patient left: in chair;with call bell/phone within reach   Time: 0750-0814 OT Time Calculation (min): 24 min Charges:  OT General Charges $OT Visit: 1 Procedure OT Evaluation $OT Eval Moderate Complexity: 1 Procedure  Evette Georges [phone] 12/18/2015, 9:09 AM

## 2015-12-18 NOTE — Evaluation (Signed)
Physical Therapy Evaluation and Discharge Patient Details Name: Gregory Hinton MRN: [id number] DOB: [birth year] Today's Date: 12/18/2015   History of Present Illness  Removal of hardware from L4 to the sacrum, decompression of L3-L4, fusion L3-4. 3 prior back sxs  Clinical Impression  Patient evaluated by Physical Therapy with no further acute PT needs identified. All education has been completed and the patient has no further questions. At the time of PT eval pt was able to perform transfers and ambulation with supervision to modified independence. See below for any follow-up Physical Therapy or equipment needs. PT is signing off. Thank you for this referral.     Follow Up Recommendations Outpatient PT    Equipment Recommendations  3in1 (PT)    Recommendations for Other Services       Precautions / Restrictions Precautions Precautions: Back Precaution Booklet Issued: Yes (comment) Precaution Comments: Reviewed handout and pt was cued for precautions during functional mobility.  Required Braces or Orthoses: Spinal Brace Spinal Brace: Lumbar corset;Applied in sitting position Restrictions Weight Bearing Restrictions: No      Mobility  Bed Mobility Overal bed mobility: Modified Independent             General bed mobility comments: Increased time. Good demonstration of log roll technique.   Transfers Overall transfer level: Needs assistance Equipment used: Rolling walker (2 wheeled) Transfers: Sit to/from Stand Sit to Stand: Supervision         General transfer comment: Supervision for safety. Pt stood to RW but did not require UE support.   Ambulation/Gait Ambulation/Gait assistance: Modified independent (Device/Increase time) Ambulation Distance (Feet): 400 Feet Assistive device: None Gait Pattern/deviations: Step-through pattern;Decreased stride length Gait velocity: Decreased Gait velocity interpretation: Below normal speed for age/gender General Gait  Details: Guarded with slightly slower gait speed. Pt was able to ambulate well without the RW for UE support.   Stairs            Wheelchair Mobility    Modified Rankin (Stroke Patients Only)       Balance Overall balance assessment: No apparent balance deficits (not formally assessed)                                           Pertinent Vitals/Pain Pain Assessment: 0-10 Pain Score: 5  Pain Location: Back Pain Descriptors / Indicators: Operative site guarding Pain Intervention(s): Limited activity within patient's tolerance;Monitored during session;Repositioned    Home Living Family/patient expects to be discharged to:: Private residence Living Arrangements: Spouse/significant other Available Help at Discharge: Family;Available 24 hours/day Type of Home: House Home Access: Stairs to enter;Ramped entrance     Home Layout: Multi-level;Able to live on main level with bedroom/bathroom Home Equipment: Dan Humphreys - 2 wheels;Shower seat - built in      Prior Function Level of Independence: Independent               Hand Dominance   Dominant Hand: Right    Extremity/Trunk Assessment   Upper Extremity Assessment: Overall WFL for tasks assessed           Lower Extremity Assessment: Overall WFL for tasks assessed      Cervical / Trunk Assessment: Other exceptions  Communication   Communication: No difficulties  Cognition Arousal/Alertness: Awake/alert Behavior During Therapy: WFL for tasks assessed/performed Overall Cognitive Status: Within Functional Limits for tasks assessed  General Comments      Exercises        Assessment/Plan    PT Assessment Patent does not need any further PT services  PT Diagnosis Difficulty walking;Acute pain   PT Problem List    PT Treatment Interventions     PT Goals (Current goals can be found in the Care Plan section) Acute Rehab PT Goals Patient Stated Goal: home  tomorrow PT Goal Formulation: All assessment and education complete, DC therapy    Frequency     Barriers to discharge        Co-evaluation PT/OT/SLP Co-Evaluation/Treatment: Yes Reason for Co-Treatment: For patient/therapist safety PT goals addressed during session: Mobility/safety with mobility;Balance;Proper use of DME OT goals addressed during session: ADL's and self-care;Strengthening/ROM       End of Session Equipment Utilized During Treatment: Back brace Activity Tolerance: Patient tolerated treatment well Patient left: in chair;with call bell/phone within reach Nurse Communication: Mobility status         Time: 5498-2641 PT Time Calculation (min) (ACUTE ONLY): 24 min   Charges:   PT Evaluation $PT Eval Moderate Complexity: 1 Procedure     PT G Codes:        Conni Slipper 08-Jan-2016, 11:22 AM   Conni Slipper, PT, DPT Acute Rehabilitation Services Pager: [phone]

## 2015-12-18 NOTE — Progress Notes (Signed)
Patient ID: Gregory Hinton, male   DOB: Sep 25, 1966, 50 y.o.   MRN: [id number] Vital signs are stable Pain is under good control Motor function is stable Dressing is dry Patient lives over an hour away Would like to be discharged tomorrow morning Continue to monitor progress

## 2015-12-19 MED ORDER — DEXAMETHASONE 1 MG PO TABS: ORAL_TABLET | ORAL | Status: DC

## 2015-12-19 MED ORDER — DIAZEPAM 5 MG PO TABS: 5.0000 mg | ORAL_TABLET | Freq: Four times a day (QID) | ORAL | Status: DC | PRN

## 2015-12-19 MED ORDER — OXYCODONE-ACETAMINOPHEN 5-325 MG PO TABS: 1.0000 | ORAL_TABLET | ORAL | Status: DC | PRN

## 2015-12-19 NOTE — Discharge Summary (Signed)
Physician Discharge Summary  Patient ID: Gregory Hinton MRN: [id number] DOB/AGE: 1966/07/13 50 y.o.  Admit date: 12/17/2015 Discharge date: 12/19/2015  Admission Diagnoses:Listhesis and stenosis L3-4 status post decompression arthrodesis L4 to sacrum in 2003. 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 Rheumatoid arthritis Active Problems:   Lumbar stenosis with neurogenic claudication   Discharged Condition: good  Hospital Course: Patient was admitted to undergo surgical decompression and stabilization at L3-4 having had a previous fusion at L4-5 and 50. He tolerated surgery well. He has rheumatoid arthritis. His pain from the rheumatoid arthritis has been substantial.  Consults: None  Significant Diagnostic Studies: None  Treatments: surgery: Decompression and fusion L3-4 removal of hardware L4 to sacrum  Discharge Exam: Blood pressure 121/67, pulse 91, temperature 98.2 F (36.8 C), temperature source Oral, resp. rate 20, weight 117.935 kg (260 lb), SpO2 96 %. Incision is clean and dry, motor function is intact in lower extremities.  Disposition:  discharge home  Discharge Instructions    Call MD for:  redness, tenderness, or signs of infection (pain, swelling, redness, odor or green/yellow discharge around incision site)    Complete by:  As directed      Call MD for:  severe uncontrolled pain    Complete by:  As directed      Call MD for:  temperature >100.4    Complete by:  As directed      Diet - low sodium heart healthy    Complete by:  As directed      Discharge instructions    Complete by:  As directed   Okay to shower. Do not apply salves or appointments to incision. No heavy lifting with the upper extremities greater than 15 pounds. May resume driving when not requiring pain medication and patient feels comfortable with doing so.     Increase activity slowly     Complete by:  As directed             Medication List    TAKE these medications        buPROPion 300 MG 24 hr tablet  Commonly known as:  WELLBUTRIN XL  Take 300 mg by mouth daily.     cholecalciferol 1000 units tablet  Commonly known as:  VITAMIN D  Take 1,000 Units by mouth 2 (two) times daily.     dexamethasone 1 MG tablet  Commonly known as:  DECADRON  2 tablets twice daily for 2 days, one tablet twice daily for 2 days, one tablet daily for 2 days.     diazepam 5 MG tablet  Commonly known as:  VALIUM  Take 1 tablet (5 mg total) by mouth every 6 (six) hours as needed for muscle spasms.     folic acid 1 MG tablet  Commonly known as:  FOLVITE  Take 1 mg by mouth 2 (two) times daily.     hydroxychloroquine 200 MG tablet  Commonly known as:  PLAQUENIL  Take 200 mg by mouth 2 (two) times daily.     ibuprofen 200 MG tablet  Commonly known as:  ADVIL,MOTRIN  Take 400 mg by mouth every 8 (eight) hours as needed for mild pain or moderate pain.     leflunomide 20 MG tablet  Commonly known as:  ARAVA  Take 20 mg by mouth every morning.     ORENCIA 125 MG/ML Sosy  Generic drug:  Abatacept  Inject into the skin once  a week. Monday     oxyCODONE-acetaminophen 5-325 MG tablet  Commonly known as:  PERCOCET/ROXICET  Take 1-2 tablets by mouth every 4 (four) hours as needed for moderate pain.     predniSONE 5 MG tablet  Commonly known as:  DELTASONE  Take 5 mg by mouth daily with breakfast.         Signed: Stefani Dama 12/19/2015, 8:46 AM

## 2015-12-19 NOTE — Progress Notes (Signed)
Occupational Therapy Treatment Patient Details Name: Gregory Hinton MRN: [id number] DOB: Jun 30, 1966 Today's Date: 12/19/2015    History of present illness Removal of hardware from L4 to the sacrum, decompression of L3-L4, fusion L3-4. 3 prior back sxs   OT comments  Pt able to meet all goals this session. Educate pt on use of AE for increased independence with LB ADLs pt able to return demo use with min guard for safety. Reviewed all back, safety, and ADL education with pt. He was able to verbally recall 3/3 back precautions at the start of the session. Pt ready to d/c from OT standpoint with no further acute OT needs identified; signing off at this time. Please re-consult if needs change.    Follow Up Recommendations  No OT follow up    Equipment Recommendations  3 in 1 bedside comode    Recommendations for Other Services      Precautions / Restrictions Precautions Precautions: Back Precaution Comments: Pt able to recall 3/3 back precautions at start of session Required Braces or Orthoses: Spinal Brace Spinal Brace: Lumbar corset;Applied in sitting position Restrictions Weight Bearing Restrictions: No       Mobility Bed Mobility               General bed mobility comments: Pt OOB in chair upon arrival.  Transfers                 General transfer comment: Not assessed at this time.    Balance                                   ADL Overall ADL's : Needs assistance/impaired       Grooming Details (indicate cue type and reason): Reviewed use of 2 cups for oral care.       Lower Body Bathing Details (indicate cue type and reason): Educated on use of long handled sponge maintaining precautions with LB bathing.     Lower Body Dressing: Min guard;Sit to/from stand Lower Body Dressing Details (indicate cue type and reason): Educated pt on use of reacher, sock aide, and long handled shoe horn; pt able to return demo use of sock aide and  reacher. Discussed purchasing AE kit prior to return home and will switch out reacher for longer reacher secondary to height.       Toileting - Clothing Manipulation Details (indicate cue type and reason): Educated on use of toilet tongs; pt verbalized understanding but thinks he will be able to manage without them. Understands how toilet tongs work if needed upon return home.       General ADL Comments: No family present for OT session. Reviewed home safety, maintaining back precautions during functional activities. Pt with no concerns regarding donning/doffing back brace.      Vision                     Perception     Praxis      Cognition   Behavior During Therapy: [facility] for tasks assessed/performed Overall Cognitive Status: Within Functional Limits for tasks assessed                       Extremity/Trunk Assessment               Exercises     Shoulder Instructions       General Comments  Pertinent Vitals/ Pain       Pain Assessment: 0-10 Pain Score: 5  Pain Location: back Pain Descriptors / Indicators: Aching Pain Intervention(s): Limited activity within patient's tolerance;Monitored during session  Home Living                                          Prior Functioning/Environment              Frequency Min 2X/week     Progress Toward Goals  OT Goals(current goals can now be found in the care plan section)  Progress towards OT goals: Goals met/education completed, patient discharged from OT  Acute Rehab OT Goals Patient Stated Goal: home today OT Goal Formulation: All assessment and education complete, DC therapy  Plan Discharge plan remains appropriate;All goals met and education completed, patient discharged from OT services    Co-evaluation                 End of Session Equipment Utilized During Treatment: Back brace   Activity Tolerance Patient tolerated treatment well   Patient Left in  chair;with call bell/phone within reach   Nurse Communication          Time: 8110-3159 OT Time Calculation (min): 15 min  Charges: OT General Charges $OT Visit: 1 Procedure OT Treatments $Self Care/Home Management : 8-22 mins  Binnie Kand M.S., OTR/L Pager: [phone]  12/19/2015, 9:08 AM

## 2015-12-19 NOTE — Progress Notes (Signed)
Patient alert and oriented, mae's well, voiding adequate amount of urine, swallowing without difficulty, c/o moderate pain and meds given prior to discharged. Patient discharged home with family. Script and discharged instructions given to patient. Patient and family stated understanding of instructions given.  

## 2016-09-17 ENCOUNTER — Other Ambulatory Visit: Payer: Self-pay | Admitting: Rheumatology

## 2016-09-17 ENCOUNTER — Other Ambulatory Visit: Payer: Self-pay | Admitting: *Deleted

## 2016-09-17 MED ORDER — ABATACEPT 125 MG/ML ~~LOC~~ SOAJ: 125.0000 mg | SUBCUTANEOUS | 0 refills | Status: DC

## 2016-09-17 NOTE — Telephone Encounter (Signed)
Refill request received via fax for Orencia  Last Visit: 05/12/16 Next visit due in January 2018 Labs: 05/14/16 WNL TB Gold 05/14/16 Neg Left message for patient to advise he is due for labs.  Okay to refill Orencia?

## 2016-09-17 NOTE — Telephone Encounter (Signed)
Last Visit: 05/12/16 Next visit due in January 2018 Labs: 05/14/16 WNL TB Gold 05/14/16 Neg Left message for patient to advise he is due for labs  Okay to refill Arava?

## 2016-09-17 NOTE — Telephone Encounter (Signed)
30 day only

## 2016-09-17 NOTE — Telephone Encounter (Signed)
Gregory Hinton w/[facility] is requesting a refill of Arava generic 20mg  be faxed to [phone]

## 2016-09-17 NOTE — Telephone Encounter (Signed)
30th supply only

## 2016-09-18 MED ORDER — LEFLUNOMIDE 20 MG PO TABS: 20.0000 mg | ORAL_TABLET | ORAL | 0 refills | Status: DC

## 2016-10-14 ENCOUNTER — Encounter: Payer: Self-pay | Admitting: Rheumatology

## 2016-10-14 ENCOUNTER — Ambulatory Visit (INDEPENDENT_AMBULATORY_CARE_PROVIDER_SITE_OTHER): Payer: BLUE CROSS/BLUE SHIELD | Admitting: Rheumatology

## 2016-10-14 VITALS — BP 136/86 | Resp 14 | Ht 76.0 in | Wt 264.0 lb

## 2016-10-14 DIAGNOSIS — M47816 Spondylosis without myelopathy or radiculopathy, lumbar region: Secondary | ICD-10-CM | POA: Diagnosis not present

## 2016-10-14 DIAGNOSIS — Z79899 Other long term (current) drug therapy: Secondary | ICD-10-CM

## 2016-10-14 DIAGNOSIS — E669 Obesity, unspecified: Secondary | ICD-10-CM | POA: Diagnosis not present

## 2016-10-14 DIAGNOSIS — M5030 Other cervical disc degeneration, unspecified cervical region: Secondary | ICD-10-CM | POA: Diagnosis not present

## 2016-10-14 DIAGNOSIS — M47812 Spondylosis without myelopathy or radiculopathy, cervical region: Secondary | ICD-10-CM | POA: Insufficient documentation

## 2016-10-14 DIAGNOSIS — M0609 Rheumatoid arthritis without rheumatoid factor, multiple sites: Secondary | ICD-10-CM | POA: Diagnosis not present

## 2016-10-14 DIAGNOSIS — Z6832 Body mass index (BMI) 32.0-32.9, adult: Secondary | ICD-10-CM

## 2016-10-14 LAB — CBC WITH DIFFERENTIAL/PLATELET
BASOS ABS: 0 {cells}/uL (ref 0–200)
BASOS PCT: 0 %
EOS ABS: 258 {cells}/uL (ref 15–500)
Eosinophils Relative: 3 %
HEMATOCRIT: 44.5 % (ref 38.5–50.0)
Hemoglobin: 14.6 g/dL (ref 13.2–17.1)
Lymphocytes Relative: 20 %
Lymphs Abs: 1720 cells/uL (ref 850–3900)
MCH: 27.7 pg (ref 27.0–33.0)
MCHC: 32.8 g/dL (ref 32.0–36.0)
MCV: 84.4 fL (ref 80.0–100.0)
MONO ABS: 688 {cells}/uL (ref 200–950)
MPV: 10.9 fL (ref 7.5–12.5)
Monocytes Relative: 8 %
NEUTROS ABS: 5934 {cells}/uL (ref 1500–7800)
Neutrophils Relative %: 69 %
Platelets: 245 10*3/uL (ref 140–400)
RBC: 5.27 MIL/uL (ref 4.20–5.80)
RDW: 14.5 % (ref 11.0–15.0)
WBC: 8.6 10*3/uL (ref 3.8–10.8)

## 2016-10-14 MED ORDER — ABATACEPT 125 MG/ML ~~LOC~~ SOAJ: 125.0000 mg | SUBCUTANEOUS | 2 refills | Status: DC

## 2016-10-14 MED ORDER — PREDNISONE 5 MG PO TABS: ORAL_TABLET | ORAL | 0 refills | Status: DC

## 2016-10-14 MED ORDER — LEFLUNOMIDE 20 MG PO TABS: 20.0000 mg | ORAL_TABLET | Freq: Every day | ORAL | 2 refills | Status: DC

## 2016-10-14 NOTE — Progress Notes (Signed)
Office Visit Note  Patient: Gregory Hinton             Date of Birth: January 07, 1966           MRN: [id number]             PCP: Irena Reichmann, DO Referring: Pollyann Savoy, MD Visit Date: 10/14/2016 Occupation: @GUAROCC @    Subjective:  Pain of the Right Knee; Pain of the Left Knee; and Follow-up (recent u nerve release and CTR )   History of Present Illness: Gregory Hinton is a 51 y.o. male  Last seen 05/12/2016. Not Doing well with rheumatoid arthritis. Some stiffness ongoing but  No synovitis.  Patient is taking Orencia subcutaneous every week, Arava 20 mg every day. Note that he has stopped his Plaquenil. He continues to use of prednisone taper on a chronic basis. He has finished his last taper and is patient is requesting a standing taper. I advised the patient that this may not be possible. But since Dr. 07/12/2016 and IR on call, if he does have a flare, he is welcome to give Gregory Hinton a call and we will be happy to appropriately prescribing prednisone when needed     Activities of Daily Living:  Patient reports morning stiffness for 15 minutes.   Patient Denies nocturnal pain.  Difficulty dressing/grooming: Denies Difficulty climbing stairs: Denies Difficulty getting out of chair: Denies Difficulty using hands for taps, buttons, cutlery, and/or writing: Denies   Review of Systems  Constitutional: Negative for fatigue.  HENT: Negative for mouth sores and mouth dryness.   Eyes: Negative for dryness.  Respiratory: Negative for shortness of breath.   Gastrointestinal: Negative for constipation and diarrhea.  Musculoskeletal: Negative for myalgias and myalgias.  Skin: Negative for sensitivity to sunlight.  Neurological: Negative for memory loss.  Psychiatric/Behavioral: Negative for sleep disturbance.    PMFS History:  Patient Active Problem List   Diagnosis Date Noted  . Class 1 obesity without serious comorbidity with body mass index (BMI) of 32.0 to 32.9 in  adult 10/14/2016  . Spondylosis of lumbar region without myelopathy or radiculopathy 10/14/2016  . DJD (degenerative joint disease), cervical 10/14/2016  . High risk medications (not anticoagulants) long-term use 10/14/2016  . Rheumatoid arthritis, seronegative, multiple sites (HCC) 10/14/2016  . Lumbar stenosis with neurogenic claudication 12/17/2015    Past Medical History:  Diagnosis Date  . Anxiety   . Arthritis   . H/O cardiovascular stress test 2014   admitted at hosp. overnight for chest pain, stress/echo test wnl  . Rheumatoid arthritis (HCC)    manages by Dr. 2015, rec'd Orencia weekly for 2 months, switched from Humira     No family history on file. Past Surgical History:  Procedure Laterality Date  . BACK SURGERY  [phone]   lumbar fusion   . CARPAL TUNNEL RELEASE Right 2000  . HERNIA REPAIR Right 2001   inguinal    Social History   Social History Narrative  . No narrative on file     Objective: Vital Signs: BP 136/86   Resp 14   Ht 6\' 4"  (1.93 m)   Wt 264 lb (119.7 kg)   BMI 32.14 kg/m    Physical Exam  Constitutional: He is oriented to person, place, and time. He appears well-developed and well-nourished.  HENT:  Head: Normocephalic and atraumatic.  Eyes: Conjunctivae and EOM are normal. Pupils are equal, round, and reactive to light.  Neck: Normal range of motion. Neck supple.  Cardiovascular: Normal  rate, regular rhythm and normal heart sounds.  Exam reveals no gallop and no friction rub.   No murmur heard. Pulmonary/Chest: Effort normal and breath sounds normal. No respiratory distress. He has no wheezes. He has no rales. He exhibits no tenderness.  Abdominal: Soft. He exhibits no distension and no mass. There is no tenderness. There is no guarding.  Musculoskeletal: Normal range of motion.  Lymphadenopathy:    He has no cervical adenopathy.  Neurological: He is alert and oriented to person, place, and time. He exhibits normal muscle tone.  Coordination normal.  Skin: Skin is warm and dry. Capillary refill takes less than 2 seconds. No rash noted.  Psychiatric: He has a normal mood and affect. His behavior is normal. Judgment and thought content normal.  Nursing note and vitals reviewed.    Musculoskeletal Exam:  Full range of motion of all joints Grip strength is equal and strong bilaterally Fibromyalgia points are all absent  CDAI Exam: CDAI Homunculus Exam:   Joint Counts:  CDAI Tender Joint count: 0 CDAI Swollen Joint count: 0  Global Assessments:  Patient Global Assessment: 2 Provider Global Assessment: 2  CDAI Calculated Score: 4  No synovitis on examination  No synovitis on examination  History of DDD of the L-spine and C-spine  Patient complaining of stiffness to hands and wrists, knees, feet, ankles. We gave him a prednisone taper few months back and did well. He is requesting a taper once again  Investigation: No additional findings.   Imaging: No results found.  Speciality Comments: No specialty comments available.    Procedures:  No procedures performed Allergies: Eggs or egg-derived products   Assessment / Plan:     Visit Diagnoses: Rheumatoid arthritis, seronegative, multiple sites (HCC)  High risk medications (not anticoagulants) long-term use - Orencia subcutaneous weekly, leflunomide 20 mg daily; stopped Plaquenil - Plan: CBC with Differential/Platelet, Comprehensive metabolic panel, CBC with Differential/Platelet, Comprehensive metabolic panel  DJD (degenerative joint disease), cervical  Spondylosis of lumbar region without myelopathy or radiculopathy  Class 1 obesity without serious comorbidity with body mass index (BMI) of 32.0 to 32.9 in adult, unspecified obesity type   Plan: #1: Rheumatoid arthritis, seronegative. No synovitis on examination.  #2: Orencia subcutaneous weekly, Arava 20 mg daily. Stopped Plaquenil  #3: Patient lost job. He is stressed out. He might be  able to join his wife's insurance.  #4: OA of bilateral knee joint. Pain off and on. Currently he's having left knee pain. I suspect that his knee pain is more from ostial then from rheumatoid and patient would benefit from Visco supplementation. 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 Take until all gone. I discussed this with the patient at length and patient understands and is agreeable. Note that he is requesting a standing prescription for prednisone taper to use when he feels like he has a flare. We had a long discussion regarding this on advised the patient against having a standing prescription. I welcomed him to call me when I'm on call or Dr. Corliss Hinton are so that we can evaluate his symptoms and only treat when appropriate. We would be happy  to call in prednisone under the circumstances at that time. Patient understands and is agreeable note that he has a history of long-term prednisone use and we want to minimize exposure to prednisone.  #8: Saw Dr. Danielle Dess and had a left ulnar release as well as median nerve release. Doing well. (Having similar problems on the right arm and has plans to discuss this with Dr. Danielle Dess soon).  #9: Since patient is stopped taking Plaquenil, he no longer needs to do Plaquenil eye exam.  Orders: Orders Placed This Encounter  Procedures  . CBC with Differential/Platelet  . Comprehensive metabolic panel   Meds ordered this encounter  Medications  . predniSONE (DELTASONE) 5 MG tablet    Sig: Prednisone 5mg : 4po qAM x 2 days, 3po qAM x 2 days, 2po qAM x 2 days, 1po qAM x 2 days, then stop. disp 20 pills w/ no refills.    Dispense:  20 tablet    Refill:  0    Order  Specific Question:   Supervising Provider    Answer:   Pollyann Savoy [[id number]]  . leflunomide (ARAVA) 20 MG tablet    Sig: Take 1 tablet (20 mg total) by mouth daily.    Dispense:  30 tablet    Refill:  2    Order Specific Question:   Supervising Provider    Answer:   Pollyann Savoy [[id number]]  . Abatacept (ORENCIA CLICKJECT) 125 MG/ML SOAJ    Sig: Inject 125 mg into the skin once a week.    Dispense:  4 mL    Refill:  2    Order Specific Question:   Supervising Provider    Answer:   Pollyann Savoy [phone]    Face-to-face time spent with patient was 30 minutes. 50% of time was spent in counseling and coordination of care.  Follow-Up Instructions: Return in about 5 months (around 03/14/2017) for RA,Orencia sub Q wkly, Arava 20mg  qd; oa kj; bil knee pain.   Tawni Pummel, PA-C  Note - This record has been created using [employer].  Chart creation errors have been sought, but may not always  have been located. Such creation errors do not reflect on  the standard of medical care.

## 2016-10-15 ENCOUNTER — Other Ambulatory Visit: Payer: Self-pay | Admitting: *Deleted

## 2016-10-15 LAB — COMPREHENSIVE METABOLIC PANEL
ALBUMIN: 4 g/dL (ref 3.6–5.1)
ALK PHOS: 60 U/L (ref 40–115)
ALT: 34 U/L (ref 9–46)
AST: 26 U/L (ref 10–35)
BUN: 12 mg/dL (ref 7–25)
CO2: 26 mmol/L (ref 20–31)
Calcium: 9.3 mg/dL (ref 8.6–10.3)
Chloride: 107 mmol/L (ref 98–110)
Creat: 0.92 mg/dL (ref 0.70–1.33)
Glucose, Bld: 90 mg/dL (ref 65–99)
POTASSIUM: 4.4 mmol/L (ref 3.5–5.3)
Sodium: 142 mmol/L (ref 135–146)
TOTAL PROTEIN: 6.3 g/dL (ref 6.1–8.1)
Total Bilirubin: 0.4 mg/dL (ref 0.2–1.2)

## 2016-10-15 MED ORDER — LEFLUNOMIDE 20 MG PO TABS: 20.0000 mg | ORAL_TABLET | Freq: Every day | ORAL | 2 refills | Status: DC

## 2016-10-15 NOTE — Progress Notes (Signed)
Prescription sent to the wrong pharmacy. Reorder to correct pharmacy.

## 2016-11-23 ENCOUNTER — Telehealth: Payer: Self-pay | Admitting: Rheumatology

## 2016-11-23 NOTE — Telephone Encounter (Signed)
Patient called and wanted to know if Mr. Leane Call will send him in a prescription for Prednisone for his pain.  QV#[phone].  Thank you.

## 2016-11-23 NOTE — Telephone Encounter (Signed)
Patient states he is having swelling in left knee. Patient states the swelling start on Friday and has gotten worse.  Patient states he leaving for [facility] for Thursday and is requesting a prescription for prednisone before leaving town.

## 2016-11-23 NOTE — Telephone Encounter (Signed)
Okay to give him the following prescription for his left knee pain/swelling. 4po qAM x 5 days,  3po qAM x 5 days, 2po qAM x 5 days, 1po qAM x 5 days, 1/2po qAM x 5 days, then stop.  disp: 54 tablets w/ no refills

## 2016-11-24 MED ORDER — PREDNISONE 5 MG PO TABS: ORAL_TABLET | ORAL | 0 refills | Status: DC

## 2016-11-24 NOTE — Telephone Encounter (Signed)
Prescription sent to the pharmacy and patient advised.  

## 2016-12-25 ENCOUNTER — Other Ambulatory Visit: Payer: Self-pay | Admitting: *Deleted

## 2016-12-25 MED ORDER — ABATACEPT 125 MG/ML ~~LOC~~ SOAJ: 125.0000 mg | SUBCUTANEOUS | 2 refills | Status: DC

## 2016-12-25 NOTE — Telephone Encounter (Signed)
Refill request received via fax  Last Visit: 10/14/16 Next Visit: 03/02/17 Labs: 10/14/16 WNL Tb Gold: 05/14/16 Neg  Okay to refill Orencia?

## 2016-12-29 ENCOUNTER — Telehealth: Payer: Self-pay | Admitting: Rheumatology

## 2016-12-29 NOTE — Telephone Encounter (Signed)
Patient calling regarding injection for something other than cortisone.  He states you had mentioned gel injection and is asking if you can proceed with ordering.  Patient is also requesting Prednisone for the L knee.  Please call in @ [facility] in [place]

## 2016-12-29 NOTE — Telephone Encounter (Signed)
Patient is also requesting Prednisone for the L knee.  Please call in @ [facility] in [place]:   Per last office visit on 10/14/16. #4: OA of bilateral knee joint. Pain off and on. Currently he's having left knee pain. I suspect that his knee pain is more from ostial then from rheumatoid and patient would benefit from Visco supplementation. We will apply for Euflexxa bilateral knees 3 on a urgent basis since patient is losing his insurance in a few months.  Please apply for Euflexxa.

## 2016-12-30 MED ORDER — PREDNISONE 5 MG PO TABS: ORAL_TABLET | ORAL | 0 refills | Status: DC

## 2016-12-30 NOTE — Telephone Encounter (Signed)
Ok to give prednisone taper:  ======== 4po qAM x 4 days;3po qAM x 4 days;2po qAM x 4 days;1po qAM x 4 days;1/2po qAM x 4 days; then stop.;disp 42 pills w/ no refills.;

## 2016-12-30 NOTE — Telephone Encounter (Signed)
Patient advised prescription sent to the pharmacy. Patient advised message sent to apply for Euflexxa and the process may take a while.

## 2017-01-29 ENCOUNTER — Telehealth: Payer: Self-pay | Admitting: Rheumatology

## 2017-01-29 MED ORDER — PREDNISONE 5 MG PO TABS: ORAL_TABLET | ORAL | 0 refills | Status: DC

## 2017-01-29 NOTE — Telephone Encounter (Signed)
Patient having a flare up, and requesting a RX for a small dose of Prednisone. Please call. Patient uses [facility] in [place].

## 2017-01-29 NOTE — Telephone Encounter (Signed)
He has had taper Feb  Another Taper March 28th He has had hard time getting off the prednisone Please advise.

## 2017-01-29 NOTE — Telephone Encounter (Signed)
Prednisone 5mg : 4po qAM x 3 days, 3po qAM x 3 days,2po qAM x 3 days,1po qAM x 3 days,1/2po qAM x 3 days,then stop.disp 32 pills w/ no refills.  Hpi ==> Patient is complaining of flare to his right hand, left elbow.Unable to form a fist.Patient feels that his Orencia subcutaneous is not working well for her anymore.He is also on .  Okay to call in a small prednisone taper as follows (I reminded the patient at his recent office visit and on the phone today that the frequent prednisone tapers are worrisome and we would like to find a better treatment option for him if he continues to flare like this.)   NOTE : ===> Patient has an appointment 03/02/2017 with Dr. 03/04/2017.We will need to discuss his frequent flares, inadequate response from Orencia subcutaneous every week, perhaps inadequate response from Arava, frequent use of prednisone tapersPatient has been laid off from his original job and is getting insurance through [facility] for now until June 2018

## 2017-02-10 NOTE — Telephone Encounter (Signed)
Patient losing insurance. Please apply ASAP. Thank you.

## 2017-02-15 NOTE — Progress Notes (Signed)
Office Visit Note  Patient: Gregory Hinton             Date of Birth: January 11, 1966           MRN: [id number]             PCP: Irena Reichmann, DO Referring: Irena Reichmann, DO Visit Date: 02/16/2017 Occupation: @GUAROCC @    Subjective:  Bilateral hand pain, knee pain, and left elbow   History of Present Illness: Gregory Hinton is a 51 y.o. male  with history of rheumatoid arthritis. He states his symptoms are gradually getting worse. In the last 3-4 weeks he's been having increased symptoms with bilateral hand pain and swelling and decreased grip strength. He is also having discomfort and swelling in his left knee joint. He states due to favoring his left knee is right knee joint has been hurting. He has some discomfort in his bilateral feet as well. He has discomfort in his bilateral shoulders. He reports that his recent PSA test was elevated and his repeat test scheduled for today. He had to her skin lesions removed one from the right side of his face and one from his right forearm the results are pending at this time. He started a prednisone taper a week ago and is on tapering dose currently.  Activities of Daily Living:  Patient reports morning stiffness for 2 hours.   Patient Reports nocturnal pain.  Difficulty dressing/grooming: Denies Difficulty climbing stairs: Reports Difficulty getting out of chair: Reports Difficulty using hands for taps, buttons, cutlery, and/or writing: Denies   Review of Systems  Constitutional: Positive for fatigue, weight loss and weakness. Negative for night sweats and weight gain.  HENT: Negative for mouth sores, mouth dryness and nose dryness.   Eyes: Positive for dryness. Negative for pain, redness and visual disturbance.  Respiratory: Negative for cough, shortness of breath and difficulty breathing.   Cardiovascular: Positive for swelling in legs/feet. Negative for palpitations, hypertension and irregular heartbeat.  Gastrointestinal:  Positive for diarrhea. Negative for blood in stool and constipation.  Endocrine: Negative for increased urination.  Genitourinary: Negative for painful urination.  Musculoskeletal: Positive for arthralgias, joint pain, joint swelling, myalgias, morning stiffness, muscle tenderness and myalgias.  Skin: Negative for color change, rash, nodules/bumps, redness, skin tightness, ulcers and sensitivity to sunlight.  Allergic/Immunologic: Negative for susceptible to infections.  Neurological: Negative for dizziness, headaches, memory loss and night sweats.  Hematological: Negative for swollen glands.  Psychiatric/Behavioral: Positive for sleep disturbance. Negative for depressed mood. The patient is not nervous/anxious.     PMFS History:  Patient Active Problem List   Diagnosis Date Noted  . Class 1 obesity without serious comorbidity with body mass index (BMI) of 32.0 to 32.9 in adult 10/14/2016  . Spondylosis of lumbar region without myelopathy or radiculopathy 10/14/2016  . DJD (degenerative joint disease), cervical 10/14/2016  . High risk medications (not anticoagulants) long-term use 10/14/2016  . Rheumatoid arthritis, seronegative, multiple sites (HCC) 10/14/2016  . Lumbar stenosis with neurogenic claudication 12/17/2015    Past Medical History:  Diagnosis Date  . Anxiety   . Arthritis   . H/O cardiovascular stress test 2014   admitted at hosp. overnight for chest pain, stress/echo test wnl  . Rheumatoid arthritis (HCC)    manages by Dr. 2015, rec'd Orencia weekly for 2 months, switched from Humira     History reviewed. No pertinent family history. Past Surgical History:  Procedure Laterality Date  . BACK SURGERY  [phone]   lumbar  fusion   . CARPAL TUNNEL RELEASE Right 2000  . HERNIA REPAIR Right 2001   inguinal    Social History   Social History Narrative  . No narrative on file     Objective: Vital Signs: BP 130/76   Pulse 78   Resp 16   Ht 6\' 4"  (1.93 m)    Wt 266 lb (120.7 kg)   BMI 32.38 kg/m    Physical Exam  Constitutional: He is oriented to person, place, and time. He appears well-developed and well-nourished.  HENT:  Head: Normocephalic and atraumatic.  Eyes: Conjunctivae and EOM are normal. Pupils are equal, round, and reactive to light.  Neck: Normal range of motion. Neck supple.  Cardiovascular: Normal rate, regular rhythm and normal heart sounds.   Pulmonary/Chest: Effort normal and breath sounds normal.  Abdominal: Soft. Bowel sounds are normal.  Neurological: He is alert and oriented to person, place, and time.  Skin: Skin is warm and dry. Capillary refill takes less than 2 seconds.  Psychiatric: He has a normal mood and affect. His behavior is normal.  Nursing note and vitals reviewed.    Musculoskeletal Exam: C-spine and thoracic spine good range of motion. He shoulder joint abduction is limited to 90 bilaterally. Elbow joints are good range of motion with some discomfort. He has tenderness over bilateral wrist joints with limited range of motion. He had synovitis in his MCPs and PIPs as described below. Hip joints and knee joints are good range of motion. He discomfort range of motion of bilateral knee joints. He is tenderness across his MTPs with no synovitis.  CDAI Exam: CDAI Homunculus Exam:   Tenderness:  RUE: glenohumeral and ulnohumeral and radiohumeral LUE: glenohumeral and ulnohumeral and radiohumeral Right hand: 2nd MCP, 3rd MCP, 2nd PIP, 3rd PIP, 4th PIP and 5th PIP Left hand: 1st MCP, 2nd MCP, 3rd MCP, 2nd PIP, 3rd PIP, 4th PIP and 5th PIP RLE: tibiofemoral LLE: tibiofemoral  Swelling:  Right hand: 2nd MCP, 3rd MCP, 2nd PIP, 3rd PIP, 4th PIP and 5th PIP Left hand: 1st MCP, 2nd PIP, 3rd PIP, 4th PIP and 5th PIP  Joint Counts:  CDAI Tender Joint count: 19 CDAI Swollen Joint count: 11  Global Assessments:  Patient Global Assessment: 6 Provider Global Assessment: 6  CDAI Calculated Score:  42    Investigation: No additional findings. CBC    Component Value Date/Time   WBC 8.6 10/14/2016 0834   RBC 5.27 10/14/2016 0834   HGB 14.6 10/14/2016 0834   HCT 44.5 10/14/2016 0834   PLT 245 10/14/2016 0834   MCV 84.4 10/14/2016 0834   MCH 27.7 10/14/2016 0834   MCHC 32.8 10/14/2016 0834   RDW 14.5 10/14/2016 0834   LYMPHSABS 1,720 10/14/2016 0834   MONOABS 688 10/14/2016 0834   EOSABS 258 10/14/2016 0834   BASOSABS 0 10/14/2016 0834   CMP     Component Value Date/Time   NA 142 10/14/2016 0834   K 4.4 10/14/2016 0834   CL 107 10/14/2016 0834   CO2 26 10/14/2016 0834   GLUCOSE 90 10/14/2016 0834   BUN 12 10/14/2016 0834   CREATININE 0.92 10/14/2016 0834   CALCIUM 9.3 10/14/2016 0834   PROT 6.3 10/14/2016 0834   ALBUMIN 4.0 10/14/2016 0834   AST 26 10/14/2016 0834   ALT 34 10/14/2016 0834   ALKPHOS 60 10/14/2016 0834   BILITOT 0.4 10/14/2016 0834   GFRNONAA >60 12/18/2015 0715   GFRAA >60 12/18/2015 0715    Imaging: No results  found.  Speciality Comments: No specialty comments available.    Procedures:  No procedures performed Allergies: Eggs or egg-derived products   Assessment / Plan:     Visit Diagnoses: Rheumatoid arthritis, seronegative, multiple sites [facility]): Patient's having a flare and was on prednisone taper currently. He has some multiple arthralgias and synovitis as described above.  High risk medications (not anticoagulants) long-term use - Orencia subcutaneous, leflunomide 20 mg by mouth daily( Failed Humira, Enbrel,MTX sq- nausea and fatigue) he tried Plaquenil in the past and discontinued the medication as the combination of Arava and Plaquenil causes some GI discomfort. We discussed the option of adding sulfasalazine or switching him from Orencia  to Simponi subcutaneous. His skin biopsy results and PSA results are pending at this time. I will hold of the switch until the results are back.At this time patient decided to go forward with  sulfasalazine. We will add sulfasalazine to his current regimen after we get G6PD results. He is going to have G6PD with his PCP. Informed consent was obtained today.  DJD (degenerative joint disease), cervical: Minimal discomfort  Spondylosis of lumbar region without myelopathy or radiculopathy: He has chronic pain  Class 1 obesity without serious comorbidity with body mass index (BMI) of 32.0 to 32.9 in adult, unspecified obesity type    Orders: Orders Placed This Encounter  Procedures  . Glucose 6 phosphate dehydrogenase   No orders of the defined types were placed in this encounter.   Face-to-face time spent with patient was 30 minutes. 50% of time was spent in counseling and coordination of care.  Follow-Up Instructions: Return in about 2 months (around 04/18/2017) for Rheumatoid arthritis.   Pollyann Savoy, MD  Note - This record has been created using Animal nutritionist.  Chart creation errors have been sought, but may not always  have been located. Such creation errors do not reflect on  the standard of medical care.

## 2017-02-16 ENCOUNTER — Ambulatory Visit (INDEPENDENT_AMBULATORY_CARE_PROVIDER_SITE_OTHER): Payer: BLUE CROSS/BLUE SHIELD | Admitting: Rheumatology

## 2017-02-16 ENCOUNTER — Encounter: Payer: Self-pay | Admitting: Rheumatology

## 2017-02-16 VITALS — BP 130/76 | HR 78 | Resp 16 | Ht 76.0 in | Wt 266.0 lb

## 2017-02-16 DIAGNOSIS — R079 Chest pain, unspecified: Secondary | ICD-10-CM | POA: Diagnosis not present

## 2017-02-16 DIAGNOSIS — Z79899 Other long term (current) drug therapy: Secondary | ICD-10-CM

## 2017-02-16 DIAGNOSIS — M0609 Rheumatoid arthritis without rheumatoid factor, multiple sites: Secondary | ICD-10-CM | POA: Diagnosis not present

## 2017-02-16 DIAGNOSIS — E669 Obesity, unspecified: Secondary | ICD-10-CM

## 2017-02-16 DIAGNOSIS — M5030 Other cervical disc degeneration, unspecified cervical region: Secondary | ICD-10-CM

## 2017-02-16 DIAGNOSIS — Z6832 Body mass index (BMI) 32.0-32.9, adult: Secondary | ICD-10-CM | POA: Diagnosis not present

## 2017-02-16 DIAGNOSIS — M47816 Spondylosis without myelopathy or radiculopathy, lumbar region: Secondary | ICD-10-CM

## 2017-02-16 DIAGNOSIS — M069 Rheumatoid arthritis, unspecified: Secondary | ICD-10-CM | POA: Diagnosis not present

## 2017-02-16 DIAGNOSIS — M47812 Spondylosis without myelopathy or radiculopathy, cervical region: Secondary | ICD-10-CM

## 2017-02-16 NOTE — Progress Notes (Signed)
Pharmacy Note  Subjective:  Patient presents today to the [facility] to see Dr. Corliss Skains.  Patient is currently taking Orencia subcutaneous 125 mg weekly and leflunomide 20 mg daily.  He was previously on hydroxychloroquine (Plaquenil) with Orencia and lefunomide, but as of January 2018 visit, hydroxychloroquine had been stopped.  Patient reports he was having increased nausea with leflunomide and hydroxychloroquine.    Today, patient reports he is having increased joint pain.  He has tried and failed Humira and Enbrel in the past.  He is also intolerant to methotrexate (nausea and fatigue).  Had a discussion with patient and Dr. Corliss Skains regarding several treatment options.  Discussed Actemra or Harriette Ohara; however, patient has history of diverticulitis and would be at high risk of GI perforation.  Discussed trial of another TNF inhibitor such as Simponi or addition of sulfasalazine to patient's current regimen.  Patient recently had skin biopsy and is scheduled to get results tomorrow.  He also had an elevated PSA and is scheduled for follow up with his primary care provider today.  Advised patient that we will need results of skin biopsy and PSA recheck before we would want to start Simponi.  Patient decided to try sulfasalazine.  I was asked to counsel the patient on sulfasalazine.    Objective: CMP     Component Value Date/Time   NA 142 10/14/2016 0834   K 4.4 10/14/2016 0834   CL 107 10/14/2016 0834   CO2 26 10/14/2016 0834   GLUCOSE 90 10/14/2016 0834   BUN 12 10/14/2016 0834   CREATININE 0.92 10/14/2016 0834   CALCIUM 9.3 10/14/2016 0834   PROT 6.3 10/14/2016 0834   ALBUMIN 4.0 10/14/2016 0834   AST 26 10/14/2016 0834   ALT 34 10/14/2016 0834   ALKPHOS 60 10/14/2016 0834   BILITOT 0.4 10/14/2016 0834   GFRNONAA >60 12/18/2015 0715   GFRAA >60 12/18/2015 0715   CBC    Component Value Date/Time   WBC 8.6 10/14/2016 0834   RBC 5.27 10/14/2016 0834   HGB 14.6  10/14/2016 0834   HCT 44.5 10/14/2016 0834   PLT 245 10/14/2016 0834   MCV 84.4 10/14/2016 0834   MCH 27.7 10/14/2016 0834   MCHC 32.8 10/14/2016 0834   RDW 14.5 10/14/2016 0834   LYMPHSABS 1,720 10/14/2016 0834   MONOABS 688 10/14/2016 0834   EOSABS 258 10/14/2016 0834   BASOSABS 0 10/14/2016 0834   01/05/17 - patient had labs at PCP office - CMP normal, CBC normal except for Hct 41.9  G6PD: ordered today  Assessment/Plan:   Patient was counseled on the purpose, proper use, and adverse effects of sulfasalazine including risk of infection and chance of nausea, headache, and sun sensitivity.  Also discussed risk of skin rash and advised patient to stop the medication and let us know if he develops a rash. Also discussed for the potential of discoloration of the urine, sweat, or tears.  Reviewed the importance of frequent labs to monitor liver, kidneys, and blood counts; and patient voiced understanding.  Provided patient with educational materials on sulfasalazine and answered all questions.  Patient consented to sulfasalazine.  Will upload consent into patient's chart.    Advised patient we will need G6PD before initiation of sulfasalazine.  Patient was unable to have G6PD drawn while in clinic today as he had to leave for an appointment with his primary care provider.  Patient took G6PD lab order with him and advised he will have level drawn at his  primary care office.  I asked for him to have results faxed to our office and provided him with the fax number [phone]).     Lilla Shook, Pharm.D., BCPS Clinical Pharmacist Pager: [phone] Phone: [phone] 02/16/2017 10:32 AM

## 2017-02-16 NOTE — Patient Instructions (Addendum)
Sulfasalazine delayed release tablets What is this medicine? SULFASALAZINE (sul fa SAL a zeen) is for ulcerative colitis and certain types of rheumatoid arthritis. This medicine may be used for other purposes; ask your health care provider or pharmacist if you have questions. COMMON BRAND NAME(S): Azulfidine En-Tabs, Sulfazine EC What should I tell my health care provider before I take this medicine? They need to know if you have any of these conditions: -asthma -blood disorders or anemia -glucose-6-phosphate dehydrogenase (G6PD) deficiency -intestinal obstruction -kidney disease -liver disease -porphyria -urinary tract obstruction -an unusual reaction to sulfasalazine, sulfa drugs, salicylates, other medicines, foods, dyes, or preservatives -pregnant or trying to get pregnant -breast-feeding How should I use this medicine? Take this medicine by mouth with a full glass of water. Follow the directions on the prescription label. If the medicine upsets your stomach, take it with food or milk. Do not crush or chew the tablets. Swallow the tablets whole. Take your medicine at regular intervals. Do not take your medicine more often than directed. Do not stop taking except on your doctor's advice. Talk to your pediatrician regarding the use of this medicine in children. Special care may be needed. While this drug may be prescribed for children as young as 6 years for selected conditions, precautions do apply. Patients over 65 years old may have a stronger reaction and need a smaller dose. Overdosage: If you think you have taken too much of this medicine contact a poison control center or emergency room at once. NOTE: This medicine is only for you. Do not share this medicine with others. What if I miss a dose? If you miss a dose, take it as soon as you can. If it is almost time for your next dose, take only that dose. Do not take double or extra doses. What may interact with this  medicine? -digoxin -folic acid This list may not describe all possible interactions. Give your health care provider a list of all the medicines, herbs, non-prescription drugs, or dietary supplements you use. Also tell them if you smoke, drink alcohol, or use illegal drugs. Some items may interact with your medicine. What should I watch for while using this medicine? Visit your doctor or health care professional for regular checks on your progress. You will need frequent blood and urine checks. This medicine can make you more sensitive to the sun. Keep out of the sun. If you cannot avoid being in the sun, wear protective clothing and use sunscreen. Do not use sun lamps or tanning beds/booths. Drink plenty of water while taking this medicine. Tell your doctor if you see the tablet in your stools. Your body may not be absorbing the medicine. What side effects may I notice from receiving this medicine? Side effects that you should report to your doctor or health care professional as soon as possible: -allergic reactions like skin rash, itching or hives, swelling of the face, lips, or tongue -fever, chills, or any other sign of infection -painful, difficult, or reduced urination -redness, blistering, peeling or loosening of the skin, including inside the mouth -severe stomach pain -unusual bleeding or bruising -unusually weak or tired -yellowing of the skin or eyes Side effects that usually do not require medical attention (report to your doctor or health care professional if they continue or are bothersome): -headache -loss of appetite -nausea, vomiting -orange color to the urine -reduced sperm count This list may not describe all possible side effects. Call your doctor for medical advice about side effects. You   may report side effects to FDA at 1-800-FDA-1088. Where should I keep my medicine? Keep out of the reach of children. Store at room temperature between 15 and 30 degrees C (59 and 86  degrees F). Keep container tightly closed. Throw away any unused medicine after the expiration date. NOTE: This sheet is a summary. It may not cover all possible information. If you have questions about this medicine, talk to your doctor, pharmacist, or health care provider.  2018 Elsevier/Gold Standard (2008-05-25 13:02:26)  

## 2017-02-17 DIAGNOSIS — M069 Rheumatoid arthritis, unspecified: Secondary | ICD-10-CM | POA: Diagnosis not present

## 2017-02-17 DIAGNOSIS — R079 Chest pain, unspecified: Secondary | ICD-10-CM | POA: Diagnosis not present

## 2017-02-22 ENCOUNTER — Telehealth: Payer: Self-pay | Admitting: Pharmacist

## 2017-02-22 NOTE — Telephone Encounter (Signed)
I have now faxed in Euflexxa benefits investigation.  I have called patient to advise, he will be covered through 05/04/17 with insurance. Will followup.

## 2017-02-22 NOTE — Telephone Encounter (Signed)
I called Mr. Wenke is follow up on G6PD lab test.  He reports he was unable to have the lab drawn on 02/16/17 as he had to go to the hospital for cardiac work-up.  He reports he has a follow up visit on Thursday, 02/25/17, and plans to get labs drawn then.  Will follow up on labs next week.   Lilla Shook, Pharm.D., BCPS, CPP Clinical Pharmacist Pager: [phone] Phone: [phone] 02/22/2017 8:40 AM

## 2017-02-23 NOTE — Telephone Encounter (Signed)
Please call patient and schedule Eulfexxa x 3 left knee ASAP, his insurance ends soon.  Tell him his insurance will cover the injections at 100%.  Buy and bill, thanks.

## 2017-03-02 ENCOUNTER — Ambulatory Visit: Payer: BLUE CROSS/BLUE SHIELD | Admitting: Rheumatology

## 2017-03-02 ENCOUNTER — Telehealth: Payer: Self-pay | Admitting: Pharmacist

## 2017-03-02 MED ORDER — SULFASALAZINE 500 MG PO TBEC: 1000.0000 mg | DELAYED_RELEASE_TABLET | Freq: Two times a day (BID) | ORAL | 2 refills | Status: DC

## 2017-03-02 NOTE — Telephone Encounter (Signed)
Plan at last visit was to add sulfasalazine to patient's regimen of Orencia and leflunomide.  Patient had G6PD done through his primary care on 02/25/17.    G6PD: 10.9 U/g Hb (reference range 8.8 to 13.4 U/g Hb)  Okay to proceed with sulfasazine?

## 2017-03-02 NOTE — Telephone Encounter (Signed)
I spoke with patient.  Reviewed plan to start sulfasalazine 2 tablet twice daily.  Advised him to start with 1 tablet twice daily for several days then increase dose if he is tolerating without adverse effects.  Reminded patient he will need labs 1 month after starting the medication.  Patient voiced understanding and denies any questions or concerns.   Lilla Shook, Pharm.D., BCPS, CPP Clinical Pharmacist Pager: [phone] Phone: [phone] 03/02/2017 2:36 PM

## 2017-03-02 NOTE — Telephone Encounter (Signed)
Okay to start on Azulfidine EN 500 mg 2 tablets by mouth twice a day total 30 day supply with 2 refills

## 2017-03-15 ENCOUNTER — Other Ambulatory Visit: Payer: Self-pay | Admitting: Rheumatology

## 2017-03-15 MED ORDER — ABATACEPT 125 MG/ML ~~LOC~~ SOAJ: 125.0000 mg | SUBCUTANEOUS | 2 refills | Status: DC

## 2017-03-15 NOTE — Telephone Encounter (Signed)
[facility] left a message this morning in regards to a refill for patient's Orencia.  CB#[phone] and fax#[phone].  Thank you.

## 2017-03-15 NOTE — Telephone Encounter (Signed)
Last Visit: 03/09/17 Next Visit: 04/23/17 Labs: 10/14/16 WNL TB Gold : 05/14/16 Neg  Okay to refill Orencia?

## 2017-03-29 ENCOUNTER — Ambulatory Visit: Payer: BLUE CROSS/BLUE SHIELD | Admitting: Rheumatology

## 2017-04-02 DIAGNOSIS — M1712 Unilateral primary osteoarthritis, left knee: Secondary | ICD-10-CM | POA: Diagnosis not present

## 2017-04-02 DIAGNOSIS — M170 Bilateral primary osteoarthritis of knee: Secondary | ICD-10-CM | POA: Insufficient documentation

## 2017-04-02 NOTE — Progress Notes (Signed)
   Procedure Note  Patient: Gregory Hinton             Date of Birth: [birth year]           MRN: [id number]             Visit Date: 04/05/2017  Procedures: Visit Diagnoses: Unilateral primary osteoarthritis, left knee - Plan: Large Joint Injection/Arthrocentesis Euflexxa #1 to left knee joint Large Joint Inj Date/Time: 04/02/2017 1:37 PM Performed by: Pollyann Savoy Authorized by: Pollyann Savoy   Consent Given by:  Patient Site marked: the procedure site was marked   Timeout: prior to procedure the correct patient, procedure, and site was verified   Indications:  Pain and joint swelling Location:  Knee Site:  L knee Prep: patient was prepped and draped in usual sterile fashion   Needle Size:  27 G Needle Length:  1.5 inches Approach:  Medial Ultrasound Guidance: No   Fluoroscopic Guidance: No   Arthrogram: No   Medications:  2 mL lidocaine 2 %; 20 mg Sodium Hyaluronate 20 MG/2ML Aspiration Attempted: Yes   Aspirate amount (mL):  0 Patient tolerance:  Patient tolerated the procedure well with no immediate complications   Patient stated today that he's been having flare of his rheumatoid arthritis and would like to change his medication. He believes that Dub Amis is not working anymore. In his opinion he did best on Enbrel. We will schedule a follow-up appointment. Per his request refill of prednisone be given. Side effects of prednisone were discussed again.   Pollyann Savoy, MD, FACR  Patient states that she's been having a lot of pain and discomfort in his knee joint.

## 2017-04-05 ENCOUNTER — Telehealth (INDEPENDENT_AMBULATORY_CARE_PROVIDER_SITE_OTHER): Payer: Self-pay

## 2017-04-05 ENCOUNTER — Ambulatory Visit (INDEPENDENT_AMBULATORY_CARE_PROVIDER_SITE_OTHER): Payer: BLUE CROSS/BLUE SHIELD | Admitting: Rheumatology

## 2017-04-05 DIAGNOSIS — M1712 Unilateral primary osteoarthritis, left knee: Secondary | ICD-10-CM | POA: Diagnosis not present

## 2017-04-05 MED ORDER — LIDOCAINE HCL 2 % IJ SOLN
2.0000 mL | INTRAMUSCULAR | Status: AC | PRN
  Administered 2017-04-02: 2 mL

## 2017-04-05 MED ORDER — PREDNISONE 5 MG PO TABS: ORAL_TABLET | ORAL | 0 refills | Status: DC

## 2017-04-05 MED ORDER — SODIUM HYALURONATE (VISCOSUP) 20 MG/2ML IX SOSY
20.0000 mg | PREFILLED_SYRINGE | INTRA_ARTICULAR | Status: AC | PRN
  Administered 2017-04-02: 20 mg via INTRA_ARTICULAR

## 2017-04-05 NOTE — Telephone Encounter (Signed)
Patient left Vm concerning a Rx for Prednisone called into his pharmacy.  Stated that he saw Dr. Corliss Skains this morning.  Please advise. Thank You.

## 2017-04-05 NOTE — Telephone Encounter (Signed)
Patient in office this morning to see Dr. Corliss Skains for Eufleexa injection. Per Dr. Corliss Skains okay to send in prednisone taper for patient. Left message to advise patient.

## 2017-04-05 NOTE — Addendum Note (Signed)
Addended by: Henriette Combs on: 04/05/2017 02:27 PM   Modules accepted: Orders

## 2017-04-06 DIAGNOSIS — M1712 Unilateral primary osteoarthritis, left knee: Secondary | ICD-10-CM

## 2017-04-06 NOTE — Progress Notes (Signed)
   Procedure Note  Patient: Gregory Hinton             Date of Birth: 1966/07/13           MRN: [id number]             Visit Date: 04/12/2017  Procedures: Visit Diagnoses: Primary osteoarthritis of left knee  Left knee Euflexxa #2 (buy/ bill)  Large Joint Inj Date/Time: 04/06/2017 10:10 AM Performed by: Tawni Pummel Authorized by: Pollyann Savoy   Consent Given by:  Patient Site marked: the procedure site was marked   Timeout: prior to procedure the correct patient, procedure, and site was verified   Indications:  Pain and joint swelling Location:  Knee Site:  L knee Prep: patient was prepped and draped in usual sterile fashion   Needle Size:  27 G Needle Length:  1.5 inches Approach:  Medial Ultrasound Guidance: No   Fluoroscopic Guidance: No   Arthrogram: No   Medications:  20 mg Sodium Hyaluronate 20 MG/2ML; 1.5 mL lidocaine 2 % Aspiration Attempted: Yes   Aspirate amount (mL):  0 Patient tolerance:  Patient tolerated the procedure well with no immediate complications          Patient reports that he received 50% improvement after the first Euflex injection to the left knee joint.

## 2017-04-12 ENCOUNTER — Ambulatory Visit (INDEPENDENT_AMBULATORY_CARE_PROVIDER_SITE_OTHER): Payer: BLUE CROSS/BLUE SHIELD | Admitting: Rheumatology

## 2017-04-12 ENCOUNTER — Encounter: Payer: Self-pay | Admitting: Rheumatology

## 2017-04-12 DIAGNOSIS — M1712 Unilateral primary osteoarthritis, left knee: Secondary | ICD-10-CM | POA: Diagnosis not present

## 2017-04-12 MED ORDER — SODIUM HYALURONATE (VISCOSUP) 20 MG/2ML IX SOSY
20.0000 mg | PREFILLED_SYRINGE | INTRA_ARTICULAR | Status: AC | PRN
  Administered 2017-04-06: 20 mg via INTRA_ARTICULAR

## 2017-04-12 MED ORDER — LIDOCAINE HCL 2 % IJ SOLN
1.5000 mL | INTRAMUSCULAR | Status: AC | PRN
  Administered 2017-04-06: 1.5 mL

## 2017-04-20 NOTE — Progress Notes (Deleted)
Office Visit Note  Patient: Gregory Hinton             Date of Birth: [birth year]           MRN: [id number]             PCP: Irena Reichmann, DO Referring: Irena Reichmann, DO Visit Date: 04/23/2017 Occupation: @GUAROCC @    Subjective:  No chief complaint on file.   History of Present Illness: Gregory Hinton is a 51 y.o. male ***   Activities of Daily Living:  Patient reports morning stiffness for *** {minute/hour:19697}.   Patient {ACTIONS;DENIES/REPORTS:21021675::"Denies"} nocturnal pain.  Difficulty dressing/grooming: {ACTIONS;DENIES/REPORTS:21021675::"Denies"} Difficulty climbing stairs: {ACTIONS;DENIES/REPORTS:21021675::"Denies"} Difficulty getting out of chair: {ACTIONS;DENIES/REPORTS:21021675::"Denies"} Difficulty using hands for taps, buttons, cutlery, and/or writing: {ACTIONS;DENIES/REPORTS:21021675::"Denies"}   No Rheumatology ROS completed.   PMFS History:  Patient Active Problem List   Diagnosis Date Noted  . Primary osteoarthritis of both knees 04/02/2017  . Class 1 obesity without serious comorbidity with body mass index (BMI) of 32.0 to 32.9 in adult 10/14/2016  . Spondylosis of lumbar region without myelopathy or radiculopathy 10/14/2016  . DJD (degenerative joint disease), cervical 10/14/2016  . High risk medications (not anticoagulants) long-term use 10/14/2016  . Rheumatoid arthritis, seronegative, multiple sites (HCC) 10/14/2016  . Lumbar stenosis with neurogenic claudication 12/17/2015    Past Medical History:  Diagnosis Date  . Anxiety   . Arthritis   . H/O cardiovascular stress test 2014   admitted at hosp. overnight for chest pain, stress/echo test wnl  . Rheumatoid arthritis (HCC)    manages by Dr. 2015, rec'd Orencia weekly for 2 months, switched from Humira     No family history on file. Past Surgical History:  Procedure Laterality Date  . BACK SURGERY  [phone]   lumbar fusion   . CARPAL TUNNEL RELEASE Right 2000  .  HERNIA REPAIR Right 2001   inguinal    Social History   Social History Narrative  . No narrative on file     Objective: Vital Signs: There were no vitals taken for this visit.   Physical Exam   Musculoskeletal Exam: ***  CDAI Exam: No CDAI exam completed.    Investigation: No additional findings. CBC Latest Ref Rng & Units 10/14/2016 12/18/2015 12/09/2015  WBC 3.8 - 10.8 K/uL 8.6 11.9(H) 7.0  Hemoglobin 13.2 - 17.1 g/dL 02/08/2016 11.4(L) 15.2  Hematocrit 38.5 - 50.0 % 44.5 33.9(L) 46.7  Platelets 140 - 400 K/uL 245 171 221   CMP Latest Ref Rng & Units 10/14/2016 12/18/2015 12/09/2015  Glucose 65 - 99 mg/dL 90 02/08/2016) 99  BUN 7 - 25 mg/dL 12 10 16   Creatinine 0.70 - 1.33 mg/dL 295(A 2.13  Sodium 135 - 146 mmol/L 142 139 143  Potassium 3.5 - 5.3 mmol/L 4.4 3.2(L) 4.1  Chloride 98 - 110 mmol/L 107 106 109  CO2 20 - 31 mmol/L 26 24 23   Calcium 8.6 - 10.3 mg/dL 9.3 0.86) 9.2  Total Protein 6.1 - 8.1 g/dL 6.3 - -  Total Bilirubin 0.2 - 1.2 mg/dL 0.4 - -  Alkaline Phos 40 - 115 U/L 60 - -  AST 10 - 35 U/L 26 - -  ALT 9 - 46 U/L 34 - -    Imaging: No results found.  Speciality Comments: No specialty comments available.    Procedures:  No procedures performed Allergies: Eggs or egg-derived products   Assessment / Plan:     Visit Diagnoses: Rheumatoid arthritis, seronegative, multiple sites (HCC)  High risk medications (not anticoagulants) long-term use  Primary osteoarthritis of both knees - SSZ Arava and Orencia sub q  DDD (degenerative disc disease), cervical  DDD (degenerative disc disease), lumbar    Orders: No orders of the defined types were placed in this encounter.  No orders of the defined types were placed in this encounter.   Face-to-face time spent with patient was *** minutes. 50% of time was spent in counseling and coordination of care.  Follow-Up Instructions: No Follow-up on file.   Dayshia Ballinas, RT  Note - This record has been created  using [employer].  Chart creation errors have been sought, but may not always  have been located. Such creation errors do not reflect on  the standard of medical care.

## 2017-04-20 NOTE — Progress Notes (Deleted)
   Procedure Note  Patient: Gregory Hinton             Date of Birth: 07-18-1966           MRN: [id number]             Visit Date: 04/23/2017  Procedures: Visit Diagnoses: Primary osteoarthritis of both knees - Plan: Large Joint Injection/Arthrocentesis, Large Joint Injection/Arthrocentesis Euflexxa #3 bilateral knees   Large Joint Inj Date/Time: 04/20/2017 4:12 PM Performed by: Pollyann Savoy Authorized by: Pollyann Savoy   Consent Given by:  Patient Site marked: the procedure site was marked   Timeout: prior to procedure the correct patient, procedure, and site was verified   Indications:  Pain Location:  Knee Site:  R knee Prep: patient was prepped and draped in usual sterile fashion   Needle Size:  27 G Needle Length:  1.5 inches Ultrasound Guidance: No   Fluoroscopic Guidance: No   Arthrogram: No   Medications:  1.5 mL lidocaine 2 %; 20 mg Sodium Hyaluronate 20 MG/2ML Aspiration Attempted: Yes   Patient tolerance:  Patient tolerated the procedure well with no immediate complications Large Joint Inj Date/Time: 04/20/2017 4:12 PM Performed by: Pollyann Savoy Authorized by: Pollyann Savoy   Consent Given by:  Patient Site marked: the procedure site was marked   Timeout: prior to procedure the correct patient, procedure, and site was verified   Indications:  Pain Location:  Knee Site:  L knee Prep: patient was prepped and draped in usual sterile fashion   Needle Size:  27 G Needle Length:  1.5 inches Ultrasound Guidance: No   Fluoroscopic Guidance: No   Arthrogram: No   Medications:  1.5 mL lidocaine 2 %; 20 mg Sodium Hyaluronate 20 MG/2ML Aspiration Attempted: Yes   Patient tolerance:  Patient tolerated the procedure well with no immediate complications

## 2017-04-23 ENCOUNTER — Ambulatory Visit (INDEPENDENT_AMBULATORY_CARE_PROVIDER_SITE_OTHER): Payer: BLUE CROSS/BLUE SHIELD | Admitting: Rheumatology

## 2017-04-23 DIAGNOSIS — M1712 Unilateral primary osteoarthritis, left knee: Secondary | ICD-10-CM

## 2017-04-23 DIAGNOSIS — M1711 Unilateral primary osteoarthritis, right knee: Secondary | ICD-10-CM

## 2017-05-03 NOTE — Progress Notes (Signed)
A user error has taken place: erroneous encounter.

## 2017-05-04 DIAGNOSIS — Z860100 Personal history of colon polyps, unspecified: Secondary | ICD-10-CM | POA: Insufficient documentation

## 2017-05-04 DIAGNOSIS — M19042 Primary osteoarthritis, left hand: Secondary | ICD-10-CM

## 2017-05-04 DIAGNOSIS — M19072 Primary osteoarthritis, left ankle and foot: Secondary | ICD-10-CM

## 2017-05-04 DIAGNOSIS — Z8719 Personal history of other diseases of the digestive system: Secondary | ICD-10-CM | POA: Insufficient documentation

## 2017-05-04 DIAGNOSIS — M19041 Primary osteoarthritis, right hand: Secondary | ICD-10-CM | POA: Insufficient documentation

## 2017-05-04 DIAGNOSIS — M19071 Primary osteoarthritis, right ankle and foot: Secondary | ICD-10-CM | POA: Insufficient documentation

## 2017-05-04 NOTE — Progress Notes (Deleted)
Office Visit Note  Patient: Gregory Hinton             Date of Birth: 12/07/1965           MRN: [id number]             PCP: Irena Reichmann, DO Referring: Irena Reichmann, DO Visit Date: 05/10/2017 Occupation: @GUAROCC @    Subjective:  No chief complaint on file.   History of Present Illness: ARCADIO COPE is a 51 y.o. male ***   Activities of Daily Living:  Patient reports morning stiffness for *** {minute/hour:19697}.   Patient {ACTIONS;DENIES/REPORTS:21021675::"Denies"} nocturnal pain.  Difficulty dressing/grooming: {ACTIONS;DENIES/REPORTS:21021675::"Denies"} Difficulty climbing stairs: {ACTIONS;DENIES/REPORTS:21021675::"Denies"} Difficulty getting out of chair: {ACTIONS;DENIES/REPORTS:21021675::"Denies"} Difficulty using hands for taps, buttons, cutlery, and/or writing: {ACTIONS;DENIES/REPORTS:21021675::"Denies"}   No Rheumatology ROS completed.   PMFS History:  Patient Active Problem List   Diagnosis Date Noted  . History of diverticulitis 05/04/2017  . History of colon polyps 05/04/2017  . Primary osteoarthritis of both hands 05/04/2017  . Primary osteoarthritis of both feet 05/04/2017  . Primary osteoarthritis of both knees 04/02/2017  . Class 1 obesity without serious comorbidity with body mass index (BMI) of 32.0 to 32.9 in adult 10/14/2016  . Spondylosis of lumbar region without myelopathy or radiculopathy 10/14/2016  . DJD (degenerative joint disease), cervical 10/14/2016  . High risk medications (not anticoagulants) long-term use 10/14/2016  . Rheumatoid arthritis, seronegative, multiple sites (HCC) 10/14/2016  . Lumbar stenosis with neurogenic claudication 12/17/2015    Past Medical History:  Diagnosis Date  . Anxiety   . Arthritis   . H/O cardiovascular stress test 2014   admitted at hosp. overnight for chest pain, stress/echo test wnl  . Rheumatoid arthritis (HCC)    manages by Dr. 2015, rec'd Orencia weekly for 2 months, switched from  Humira     No family history on file. Past Surgical History:  Procedure Laterality Date  . BACK SURGERY  [phone]   lumbar fusion   . CARPAL TUNNEL RELEASE Right 2000  . HERNIA REPAIR Right 2001   inguinal    Social History   Social History Narrative  . No narrative on file     Objective: Vital Signs: There were no vitals taken for this visit.   Physical Exam   Musculoskeletal Exam: ***  CDAI Exam: No CDAI exam completed.    Investigation: Findings:  05/12/2016 negative TB gold   CBC Latest Ref Rng & Units 10/14/2016 12/18/2015 12/09/2015  WBC 3.8 - 10.8 K/uL 8.6 11.9(H) 7.0  Hemoglobin 13.2 - 17.1 g/dL 02/08/2016 11.4(L) 15.2  Hematocrit 38.5 - 50.0 % 44.5 33.9(L) 46.7  Platelets 140 - 400 K/uL 245 171 221   CMP Latest Ref Rng & Units 10/14/2016 12/18/2015 12/09/2015  Glucose 65 - 99 mg/dL 90 02/08/2016) 99  BUN 7 - 25 mg/dL 12 10 16   Creatinine 0.70 - 1.33 mg/dL 818(H 6.31  Sodium 135 - 146 mmol/L 142 139 143  Potassium 3.5 - 5.3 mmol/L 4.4 3.2(L) 4.1  Chloride 98 - 110 mmol/L 107 106 109  CO2 20 - 31 mmol/L 26 24 23   Calcium 8.6 - 10.3 mg/dL 9.3 4.97) 9.2  Total Protein 6.1 - 8.1 g/dL 6.3 - -  Total Bilirubin 0.2 - 1.2 mg/dL 0.4 - -  Alkaline Phos 40 - 115 U/L 60 - -  AST 10 - 35 U/L 26 - -  ALT 9 - 46 U/L 34 - -    Imaging: No results found.  Speciality Comments:  No specialty comments available.    Procedures:  No procedures performed Allergies: Eggs or egg-derived products   Assessment / Plan:     Visit Diagnoses: Rheumatoid arthritis, seronegative, multiple sites (HCC)  High risk medications (not anticoagulants) long-term use  Primary osteoarthritis of both hands  DDD (degenerative disc disease), cervical  DDD (degenerative disc disease), lumbar s/p Lumbar fusion   Primary osteoarthritis of both knees  Primary osteoarthritis of both feet  History of diverticulitis  History of colon polyps    Orders: No orders of the defined types were  placed in this encounter.  No orders of the defined types were placed in this encounter.   Face-to-face time spent with patient was *** minutes. 50% of time was spent in counseling and coordination of care.  Follow-Up Instructions: No Follow-up on file.   Azra Abrell, RT  Note - This record has been created using [employer].  Chart creation errors have been sought, but may not always  have been located. Such creation errors do not reflect on  the standard of medical care.

## 2017-05-10 ENCOUNTER — Ambulatory Visit: Payer: BLUE CROSS/BLUE SHIELD | Admitting: Rheumatology

## 2017-05-14 ENCOUNTER — Ambulatory Visit (INDEPENDENT_AMBULATORY_CARE_PROVIDER_SITE_OTHER): Payer: BLUE CROSS/BLUE SHIELD | Admitting: Rheumatology

## 2017-05-14 ENCOUNTER — Encounter: Payer: Self-pay | Admitting: Rheumatology

## 2017-05-14 VITALS — BP 149/90 | HR 107 | Resp 14 | Ht 76.0 in | Wt 262.0 lb

## 2017-05-14 DIAGNOSIS — M0609 Rheumatoid arthritis without rheumatoid factor, multiple sites: Secondary | ICD-10-CM | POA: Diagnosis not present

## 2017-05-14 DIAGNOSIS — E669 Obesity, unspecified: Secondary | ICD-10-CM

## 2017-05-14 DIAGNOSIS — Z6832 Body mass index (BMI) 32.0-32.9, adult: Secondary | ICD-10-CM

## 2017-05-14 DIAGNOSIS — M170 Bilateral primary osteoarthritis of knee: Secondary | ICD-10-CM

## 2017-05-14 DIAGNOSIS — M79641 Pain in right hand: Secondary | ICD-10-CM | POA: Diagnosis not present

## 2017-05-14 DIAGNOSIS — M79642 Pain in left hand: Secondary | ICD-10-CM | POA: Diagnosis not present

## 2017-05-14 DIAGNOSIS — Z79899 Other long term (current) drug therapy: Secondary | ICD-10-CM

## 2017-05-14 LAB — CBC WITH DIFFERENTIAL/PLATELET
BASOS PCT: 0 %
Basophils Absolute: 0 cells/uL (ref 0–200)
EOS PCT: 0 %
Eosinophils Absolute: 0 cells/uL — ABNORMAL LOW (ref 15–500)
HEMATOCRIT: 46.6 % (ref 38.5–50.0)
HEMOGLOBIN: 15.5 g/dL (ref 13.2–17.1)
LYMPHS ABS: 1661 {cells}/uL (ref 850–3900)
Lymphocytes Relative: 11 %
MCH: 29.1 pg (ref 27.0–33.0)
MCHC: 33.3 g/dL (ref 32.0–36.0)
MCV: 87.4 fL (ref 80.0–100.0)
MONO ABS: 906 {cells}/uL (ref 200–950)
MPV: 11.3 fL (ref 7.5–12.5)
Monocytes Relative: 6 %
NEUTROS ABS: 12533 {cells}/uL — AB (ref 1500–7800)
Neutrophils Relative %: 83 %
Platelets: 255 10*3/uL (ref 140–400)
RBC: 5.33 MIL/uL (ref 4.20–5.80)
RDW: 14.3 % (ref 11.0–15.0)
WBC: 15.1 10*3/uL — AB (ref 3.8–10.8)

## 2017-05-14 NOTE — Progress Notes (Signed)
Rheumatology Medication Review by a Pharmacist Does the patient feel that his/her medications are working for him/her?  Yes Has the patient been experiencing any side effects to the medications prescribed?  Yes, patient had headaches with sulfasalazine and stopped the medication Does the patient have any problems obtaining medications?  No  Issues to address at subsequent visits: None   Pharmacist comments:  Gregory Hinton is a pleasant 51 yo M who presents for follow up of rheumatoid arthritis.  He is currently taking Orencia 125 mg weekly and leflunomide 20 mg daily.  He stopped sulfasalazine due to headaches.  Most recent standing labs were on 03/24/17.  TB Gold negative on 05/12/16.  Patient is due for TB Gold again today.  Patient reports he has new insurance.  Will send a message to [facility] to submit prior authorization under patient's new insurance.  I was asked to provide patient with two Orencia samples by Melina Copa, PA-C.  Medication Samples have been provided to the patient.  Drug name: Orencia       Strength: 125 mg        Qty: 2  LOT: [id number]  Exp.Date: 04/2018  Dosing instructions: Inject under the skin once weekly.    The patient has been instructed regarding the correct time, dose, and frequency of taking this medication, including desired effects and most common side effects.  Patient denies any further questions or concerns regarding his medications at this time.    Lilla Shook, Pharm.D., BCPS, CPP Clinical Pharmacist Pager: [phone] Phone: [phone] 05/14/2017 2:35 PM

## 2017-05-14 NOTE — Progress Notes (Signed)
Office Visit Note  Patient: Gregory Hinton             Date of Birth: 08/21/1966           MRN: [id number]             PCP: Janie Morning, DO Referring: Janie Morning, DO Visit Date: 05/14/2017 Occupation: _0 @    Subjective:  No chief complaint on file.   History of Present Illness: Gregory Hinton is a 51 y.o. male  Who was last seen in our office on 02/16/2017 for rheumatoid arthritis. At that visit, Patient states that his symptoms were gradually worsening over 3-4 weeks. And there was swelling to bilateral hands and decreased grip strength. He also had swelling and discomfort to the left knee and he was favoring his opposite knee as a result. Then his opposite knee (right knee began to hurt). He also has lateral shoulder joint discomfort. He also has been taking prednisone tapers on a frequent basis. As a result of these issues, it was decided to add sulfasalazine to his treatment regimen.  Today, patient states that he is currently only on On orencia q week 182m And arava 217md  Although we wrote sulfasalazine for the patient he has had to discontinue the medication for the following reason  (stopped sulfasalazine ==> caused whole head to hurt even when starting on ssz 1 bid; never was able to increase the dose from 1 pill twice a day to 2 pills twice a day; once patient stopped the sulfasalazine, the patient states that the headache went away.)  Patient states that the Orencia every week and Arava 20 mg every day is adequately controlling his arthritis.  He's had failure with multiple medications as follows:===> He has tried and failed Humira and Enbrel in the past.  He is also intolerant to methotrexate (nausea and fatigue).   patient cannot take Actemra was XeMorrie Sheldonecause of the history of diverticulitis and high-risk for GI perforation.   He may be eligible for Simponi subcutaneous in the future if the current dual therapy of Orencia and Arava are  ineffective.  Also, patient is struggling with insurance. 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 And, he lost his job. He [place] able [place] get [place] he is buying his own due to insurance which is not very good despite being on the Silver plan. He is currently discussing patient assistance program with the makers of Orencia otherwise OrMaureen Chattersould not be affordable. He's asking for samples of Orencia today so he has enough medication to keep him going.  Activities of Daily Living:  Patient reports morning stiffness for 2 hours.   Patient Reports nocturnal pain.  Difficulty dressing/grooming: Denies Difficulty climbing stairs: Reports Difficulty getting out of chair: Reports Difficulty using hands for taps, buttons, cutlery, and/or writing: Reports   Review of Systems  Constitutional: Negative for fatigue.  HENT: Negative for mouth sores and mouth dryness.   Eyes: Negative for dryness.  Respiratory: Negative for shortness of breath.   Gastrointestinal: Negative for constipation and diarrhea.  Musculoskeletal: Negative for myalgias and myalgias.  Skin: Negative for sensitivity to sunlight.  Neurological: Negative for memory loss.  Psychiatric/Behavioral: Negative for sleep disturbance.    PMFS History:  Patient Active Problem List   Diagnosis Date Noted  . History of diverticulitis 05/04/2017  . History of colon polyps  05/04/2017  . Primary osteoarthritis of both hands 05/04/2017  . Primary osteoarthritis of both feet 05/04/2017  . Primary osteoarthritis of both knees 04/02/2017  . Class 1 obesity without serious comorbidity with body mass index (BMI) of 32.0 to 32.9 in adult 10/14/2016  . Spondylosis of lumbar region without myelopathy or radiculopathy 10/14/2016  . DJD (degenerative joint disease), cervical 10/14/2016  . High risk medications (not anticoagulants)  long-term use 10/14/2016  . Rheumatoid arthritis, seronegative, multiple [name] 10/14/2016  . Lumbar stenosis with neurogenic claudication 12/17/2015    Past Medical History:  Diagnosis Date  . Anxiety   . Arthritis   . H/O cardiovascular stress test 2014   admitted at [facility]. overnight for chest pain, stress/echo test wnl  . Rheumatoid arthritis ([place])    manages by Dr. Estanislado Pandy, rec'd Orencia weekly for 2 months, switched from Humira     History reviewed. No pertinent family history. Past Surgical History:  Procedure Laterality Date  . BACK SURGERY  [phone]   lumbar fusion   . CARPAL TUNNEL RELEASE Right 2000  . HERNIA REPAIR Right 2001   inguinal    Social History   Social History Narrative  . No narrative on file     Objective: Vital Signs: BP (!) 149/90   Pulse (!) 107   Resp 14   Ht _0  (1.93 m)   Wt 262 lb (118.8 kg)   BMI 31.89 kg/m    Physical Exam  Constitutional: He is oriented to person, place, and time. He appears well-developed and well-nourished.  HENT:  Head: Normocephalic and atraumatic.  Eyes: Pupils are equal, round, and reactive to light. Conjunctivae and EOM are normal.  Neck: Normal range of motion. Neck supple.  Cardiovascular: Normal rate, regular rhythm and normal heart sounds.  Exam reveals no gallop and no friction rub.   No murmur heard. Pulmonary/Chest: Effort normal and breath sounds normal. No respiratory distress. He has no wheezes. He has no rales. He exhibits no tenderness.  Abdominal: Soft. He exhibits no distension and no mass. There is no tenderness. There is no guarding.  Musculoskeletal: Normal range of motion.  Lymphadenopathy:    He has no cervical adenopathy.  Neurological: He is alert and oriented to person, place, and time. He exhibits normal muscle tone. Coordination normal.  Skin: Skin is warm and dry. Capillary refill takes less than 2 seconds. No rash noted.  Psychiatric: He has a normal mood and affect. His  behavior is normal. Judgment and thought content normal.  Vitals reviewed.    Musculoskeletal Exam:  Full range of motion of all joints Grip strength is equal and strong bilaterally Fibromyalgia tender points are absent  CDAI Exam: CDAI Homunculus Exam:   Joint Counts:  CDAI Tender Joint count: 0 CDAI Swollen Joint count: 0  Global Assessments:  Patient Global Assessment: 2 Provider Global Assessment: 2  CDAI Calculated Score: 4  No synovitis on examination today.  Investigation: No additional findings.  Patient has had G6PD done on May 2018. The document his scanned. I don't see any other labs other than G6PD. We will update his labs today.  No visits with results within 6 Month(s) from this visit.  Latest known visit with results is:  Office Visit on 10/14/2016  Component Date Value Ref Range Status  . WBC 10/14/2016 8.6  3.8 - 10.8 K/uL Final  . RBC 10/14/2016 5.27  4.20 - 5.80 MIL/uL Final  . Hemoglobin 10/14/2016 14.6  13.2 - 17.1 g/dL Final  .  HCT 10/14/2016 44.5  38.5 - 50.0 % Final  . MCV 10/14/2016 84.4  80.0 - 100.0 fL Final  . MCH 10/14/2016 27.7  27.0 - 33.0 pg Final  . MCHC 10/14/2016 32.8  32.0 - 36.0 g/dL Final  . RDW 10/14/2016 14.5  11.0 - 15.0 % Final  . Platelets 10/14/2016 245  140 - 400 K/uL Final  . MPV 10/14/2016 10.9  7.5 - 12.5 fL Final  . Neutro Abs 10/14/2016 5934  1,500 - 7,800 cells/uL Final  . Lymphs Abs 10/14/2016 1720  850 - 3,900 cells/uL Final  . Monocytes Absolute 10/14/2016 688  200 - 950 cells/uL Final  . Eosinophils Absolute 10/14/2016 258  15 - 500 cells/uL Final  . Basophils Absolute 10/14/2016 0  0 - 200 cells/uL Final  . Neutrophils Relative % 10/14/2016 69  % Final  . Lymphocytes Relative 10/14/2016 20  % Final  . Monocytes Relative 10/14/2016 8  % Final  . Eosinophils Relative 10/14/2016 3  % Final  . Basophils Relative 10/14/2016 0  % Final  . Smear Review 10/14/2016 Criteria for review not met   Final  . Sodium  10/14/2016 142  135 - 146 mmol/L Final  . Potassium 10/14/2016 4.4  3.5 - 5.3 mmol/L Final  . Chloride 10/14/2016 107  98 - 110 mmol/L Final  . CO2 10/14/2016 26  20 - 31 mmol/L Final  . Glucose, Bld 10/14/2016 90  65 - 99 mg/dL Final  . BUN 10/14/2016 12  7 - 25 mg/dL Final  . Creat 10/14/2016 0.92  0.70 - 1.33 mg/dL Final   Comment:   For patients > or = 52 years of age: The upper reference limit for Creatinine is approximately 13% higher for people identified as African-American.     . Total Bilirubin 10/14/2016 0.4  0.2 - 1.2 mg/dL Final  . Alkaline Phosphatase 10/14/2016 60  40 - 115 U/L Final  . AST 10/14/2016 26  10 - 35 U/L Final  . ALT 10/14/2016 34  9 - 46 U/L Final  . Total Protein 10/14/2016 6.3  6.1 - 8.1 g/dL Final  . Albumin 10/14/2016 4.0  3.6 - 5.1 g/dL Final  . Calcium 10/14/2016 9.3  8.6 - 10.3 mg/dL Final     Imaging: No results found.  Speciality Comments: No specialty comments available.  Procedures:  No procedures performed Allergies: Eggs or egg-derived products   Assessment / Plan:     Visit Diagnoses: Rheumatoid arthritis, seronegative, multiple Hinton ([place]) - Plan: CBC with Differential/Platelet, COMPLETE METABOLIC PANEL WITH GFR, Quantiferon tb gold assay (blood)  High risk medications (not anticoagulants) long-term use - Plan: CBC with Differential/Platelet, COMPLETE METABOLIC PANEL WITH GFR, Quantiferon tb gold assay (blood)  Class 1 obesity without serious comorbidity with body mass index (BMI) of 32.0 to 32.9 in adult, unspecified obesity type  Primary osteoarthritis of both knees  Bilateral hand pain   Plan: #1: Rheumatoid arthritis. Patient is doing relatively well. Taking Orencia once a week Arava 20 mg daily Adequate response No synovitis on exam. On occasion, patient does report flares. He states that his joints in his hands at first and second MCP joint as well as a few others do get warm and red and swollen. At that time he does  require prednisone taper. 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 Currently he struggling getting Orencia. He is going to try patient assistance program.  #2 high risk prescription. See above. Patient's labs are due today.  He states that he had labs done in May 2018 at his PCPs office at [place]. We only have G6PD available (which was negative). Patient states that other labs were done and he'll send [place] a copy. In the meanwhile, he is due for TB goal today, CBC with differential, CMP with GFR  #3: OA bilateral hands. Some ongoing hand pain.  #4: Insurance issues  #5: History of OA of bilateral knees  #6: Patient may benefit from changing his biologic if she continues to have flares. I've asked him to keep a close record of how often he flares and when he needs prednisone. When he comes in the four-month follow-up, he will present this data to our office. He's failed many medications before. See history of present illness for full details. Patient may benefit from symphony instead of [place]. Note that patient had to stop sulfasalazine because he had headaches on a low dose of sulfasalazine.  #7: Return to clinic in 33month  Orders: Orders Placed This Encounter  Procedures  . CBC with Differential/Platelet  . COMPLETE METABOLIC PANEL WITH GFR  . Quantiferon tb gold assay (blood)   No orders of the defined types were placed in this encounter.   Face-to-face time spent with patient was 30 minutes. 50% of time was spent in counseling and coordination of care.  Follow-Up Instructions: No Follow-up on file.   NEliezer Lofts PA-C  Note - This record has been created using [employer]  Chart creation errors have been sought, but may not always  have been located. Such creation errors do not reflect on  the standard of medical care.

## 2017-05-15 LAB — COMPLETE METABOLIC PANEL WITH GFR
ALBUMIN: 4.1 g/dL (ref 3.6–5.1)
ALT: 23 U/L (ref 9–46)
AST: 14 U/L (ref 10–35)
Alkaline Phosphatase: 53 U/L (ref 40–115)
BUN: 20 mg/dL (ref 7–25)
CALCIUM: 9.3 mg/dL (ref 8.6–10.3)
CHLORIDE: 101 mmol/L (ref 98–110)
CO2: 25 mmol/L (ref 20–32)
CREATININE: 1.08 mg/dL (ref 0.70–1.33)
GFR, Est African American: >89 mL/min (ref >=60)
GFR, Est Non African American: 78 mL/min (ref >=60)
GLUCOSE: 88 mg/dL (ref 65–99)
Potassium: 4.2 mmol/L (ref 3.5–5.3)
SODIUM: 137 mmol/L (ref 135–146)
Total Bilirubin: 0.3 mg/dL (ref 0.2–1.2)
Total Protein: 6.1 g/dL (ref 6.1–8.1)

## 2017-05-17 ENCOUNTER — Telehealth: Payer: Self-pay

## 2017-05-17 NOTE — Telephone Encounter (Signed)
A prior authorization for Orencia was submitted to patient's new [place] plan via cover my meds.   Received a confirmation from cover my meds that patient has been approved from 05/17/17-10/04/2038.   Called patient to update him. Told him about the Orencia copay card. He states that he has one already. He will call the pharmacy to get a price for his copay and make sure that process it with the copay assistance card.   Natania Finigan, [place], CPhT 9:59 AM

## 2017-05-18 LAB — QUANTIFERON TB GOLD ASSAY (BLOOD)
INTERFERON GAMMA RELEASE ASSAY: NEGATIVE
Mitogen-Nil: 9.66 IU/mL
QUANTIFERON NIL VALUE: 0.02 [IU]/mL
QUANTIFERON TB AG MINUS NIL: 0 [IU]/mL

## 2017-06-08 ENCOUNTER — Telehealth: Payer: Self-pay | Admitting: Radiology

## 2017-06-08 MED ORDER — LEFLUNOMIDE 20 MG PO TABS: 20.0000 mg | ORAL_TABLET | Freq: Every day | ORAL | 2 refills | Status: DC

## 2017-06-08 NOTE — Telephone Encounter (Signed)
Last Visit: 05/14/17 Next visit: 09/14/17 Labs: 05/14/17 Elevated WBC 15.1 (on prednisone taper) Previously normal  Okay to refill per Dr. Corliss Skains

## 2017-06-08 NOTE — Telephone Encounter (Signed)
Refill request received via fax for Arava received from [facility]

## 2017-06-11 ENCOUNTER — Other Ambulatory Visit: Payer: Self-pay | Admitting: *Deleted

## 2017-06-11 MED ORDER — ABATACEPT 125 MG/ML ~~LOC~~ SOAJ: 125.0000 mg | SUBCUTANEOUS | 2 refills | Status: DC

## 2017-06-11 NOTE — Telephone Encounter (Signed)
Refill request received via fax  Last Visit: 05/14/17 Next Visit: 09/14/17 Labs: 05/14/17 Elevated WBC due to prednisone use TB Gold: 05/14/17 Neg  Okay to refill per Dr. Corliss Skains

## 2017-09-01 NOTE — Progress Notes (Deleted)
Office Visit Note  Patient: Gregory Hinton             Date of Birth: 1965/11/12           MRN: [id number]             PCP: Irena Reichmann, DO Referring: Irena Reichmann, DO Visit Date: 09/14/2017 Occupation: @GUAROCC @    Subjective:  No chief complaint on file.   History of Present Illness: Gregory Hinton is a 51 y.o. male ***   Activities of Daily Living:  Patient reports morning stiffness for *** {minute/hour:19697}.   Patient {ACTIONS;DENIES/REPORTS:21021675::"Denies"} nocturnal pain.  Difficulty dressing/grooming: {ACTIONS;DENIES/REPORTS:21021675::"Denies"} Difficulty climbing stairs: {ACTIONS;DENIES/REPORTS:21021675::"Denies"} Difficulty getting out of chair: {ACTIONS;DENIES/REPORTS:21021675::"Denies"} Difficulty using hands for taps, buttons, cutlery, and/or writing: {ACTIONS;DENIES/REPORTS:21021675::"Denies"}   No Rheumatology ROS completed.   PMFS History:  Patient Active Problem List   Diagnosis Date Noted  . History of diverticulitis 05/04/2017  . History of colon polyps 05/04/2017  . Primary osteoarthritis of both hands 05/04/2017  . Primary osteoarthritis of both feet 05/04/2017  . Primary osteoarthritis of both knees 04/02/2017  . Class 1 obesity without serious comorbidity with body mass index (BMI) of 32.0 to 32.9 in adult 10/14/2016  . Spondylosis of lumbar region without myelopathy or radiculopathy 10/14/2016  . DJD (degenerative joint disease), cervical 10/14/2016  . High risk medications (not anticoagulants) long-term use 10/14/2016  . Rheumatoid arthritis, seronegative, multiple sites (H[facility]) 10/14/2016  . Lumbar stenosis with neurogenic claudication 12/17/2015    Past Medical History:  Diagnosis Date  . Anxiety   . Arthritis   . H/O cardiovascular stress test 2014   ad[facility] at hosp. overnight for chest pain, stress/echo test wnl  . Rheumatoid arthritis ([facility])    manages by Dr. 2015, rec'd Orencia weekly for 2 months, switched from  Humira     No family history on file. Past Surgical History:  Procedure Laterality Date  . BACK SURGERY  [phone]   lumbar fusion   . CARPAL TUNNEL RELEASE Right 2000  . HERNIA REPAIR Right 2001   inguinal    Social History   Social History Narrative  . Not on file     Objective: Vital Signs: There were no vitals taken for this visit.   Physical Exam   Musculoskeletal Exam: ***  CDAI Exam: No CDAI exam completed.    Investigation: No additional findings.TB Gold: 05/14/2017 Negative  CBC Latest Ref Rng & Units 05/14/2017 10/14/2016 12/18/2015  WBC 3.8 - 10.8 K/uL 15.1(H) 8.6 11.9(H)  Hemoglobin 13.2 - 17.1 g/dL 12/20/2015 47.8 11.4(L)  Hematocrit 38.5 - 50.0 % 46.6 44.5 33.9(L)  Platelets 140 - 400 K/uL 255 245 171   CMP Latest Ref Rng & Units 05/14/2017 10/14/2016 12/18/2015  Glucose 65 - 99 mg/dL 88 90 12/20/2015)  BUN 7 - 25 mg/dL 20 12 10   Creatinine 0.70 - 1.33 mg/dL 621(H 0.86  Sodium 135 - 146 mmol/L 137 142 139  Potassium 3.5 - 5.3 mmol/L 4.2 4.4 3.2(L)  Chloride 98 - 110 mmol/L 101 107 106  CO2 20 - 32 mmol/L 25 26 24   Calcium 8.6 - 10.3 mg/dL 9.3 9.3 5.78)  Total Protein 6.1 - 8.1 g/dL 6.1 6.3 -  Total Bilirubin 0.2 - 1.2 mg/dL 0.3 0.4 -  Alkaline Phos 40 - 115 U/L 53 60 -  AST 10 - 35 U/L 14 26 -  ALT 9 - 46 U/L 23 34 -    Imaging: No results found.  Speciality Comments: No specialty  comments available.    Procedures:  No procedures performed Allergies: Eggs or egg-derived products   Assessment / Plan:     Visit Diagnoses: No diagnosis found.    Orders: No orders of the defined types were placed in this encounter.  No orders of the defined types were placed in this encounter.   Face-to-face time spent with patient was *** minutes. 50% of time was spent in counseling and coordination of care.  Follow-Up Instructions: No Follow-up on file.   Ellen Henri, CMA  Note - This record has been created using Animal nutritionist.  Chart creation  errors have been sought, but may not always  have been located. Such creation errors do not reflect on  the standard of medical care.

## 2017-09-04 ENCOUNTER — Other Ambulatory Visit: Payer: Self-pay | Admitting: Rheumatology

## 2017-09-07 ENCOUNTER — Telehealth: Payer: Self-pay | Admitting: Rheumatology

## 2017-09-07 NOTE — Telephone Encounter (Signed)
Last Visit: 05/14/17 Next Visit: 09/14/17 Labs: 05/14/17 Elevated WBC due to prednisone use TB Gold: 05/14/17 Neg  Okay to refill 30 day supply per Dr. Corliss Skains

## 2017-09-07 NOTE — Telephone Encounter (Signed)
Prescription was sent to pharmacy. 

## 2017-09-07 NOTE — Telephone Encounter (Signed)
[facility] request Orencia rx for patient. # [phone]

## 2017-09-14 ENCOUNTER — Ambulatory Visit: Payer: BLUE CROSS/BLUE SHIELD | Admitting: Rheumatology

## 2017-09-16 NOTE — Progress Notes (Signed)
Office Visit Note  Patient: Gregory Hinton             Date of Birth: Dec 07, 1965           MRN: [id number]             PCP: Irena Reichmann, DO Referring: Irena Reichmann, DO Visit Date: 09/20/2017 Occupation: @GUAROCC @    Subjective:  Right shoulder pain    History of Present Illness: Gregory Hinton is a 51 y.o. male with rheumatoid arthritis and osteoarthritis.  Patient states the Arava 20 mg and [place] combination have been working well for him.  He is currently not taking Prednisone, but did awhile due to back pain.  He is also not on Sulfasalazine.  He continues to have chronic right shoulder pain and left knee pain.  He does not think the pain is bad enough for an injection at [facility] time.  He states his hands hurt him intermittently and he occasionally has swelling.     Activities of Daily Living:  Patient reports morning stiffness for 30 minutes.   Patient Denies nocturnal pain.  Difficulty dressing/grooming: Denies Difficulty climbing stairs: Reports Difficulty getting out of chair: Denies Difficulty using hands for taps, buttons, cutlery, and/or writing: Denies   Review of Systems  Constitutional: Positive for fatigue. Negative for night sweats and weakness.  HENT: Negative for mouth sores, mouth dryness and nose dryness.   Eyes: Positive for dryness. Negative for redness.  Respiratory: Negative for cough, hemoptysis, shortness of breath and difficulty breathing.   Cardiovascular: Negative for chest pain, palpitations, hypertension, irregular heartbeat and swelling in legs/feet.  Gastrointestinal: Positive for diarrhea. Negative for blood in stool and constipation.  Endocrine: Negative for increased urination.  Genitourinary: Negative for painful urination.  Musculoskeletal: Positive for arthralgias, joint pain, joint swelling, muscle weakness and morning stiffness. Negative for myalgias, muscle tenderness and myalgias.  Skin: Negative for color change, rash,  hair loss, nodules/bumps, skin tightness, ulcers and sensitivity to sunlight.  Allergic/Immunologic: Negative for susceptible to infections.  Neurological: Negative for dizziness, fainting, memory loss and night sweats.  Hematological: Negative for swollen glands.  Psychiatric/Behavioral: Negative for depressed mood and sleep disturbance. The patient is not nervous/anxious.     PMFS History:  Patient Active Problem List   Diagnosis Date Noted  . History of diverticulitis 05/04/2017  . History of colon polyps 05/04/2017  . Primary osteoarthritis of both hands 05/04/2017  . Primary osteoarthritis of both feet 05/04/2017  . Primary osteoarthritis of both knees 04/02/2017  . Class 1 obesity without serious comorbidity with body mass index (BMI) of 32.0 to 32.9 in adult 10/14/2016  . Spondylosis of lumbar region without myelopathy or radiculopathy 10/14/2016  . DJD (degenerative joint disease), cervical 10/14/2016  . High risk medications (not anticoagulants) long-term use 10/14/2016  . Rheumatoid arthritis, seronegative, multiple sites (HCC) 10/14/2016  . Lumbar stenosis with neurogenic claudication 12/17/2015    Past Medical History:  Diagnosis Date  . Anxiety   . Arthritis   . H/O cardiovascular stress test 2014   admitted at hosp. overnight for chest pain, stress/echo test wnl  . Rheumatoid arthritis (HCC)    manages by Dr. 2015, rec'd Orencia weekly for 2 months, switched from Humira     History reviewed. No pertinent family history. Past Surgical History:  Procedure Laterality Date  . BACK SURGERY  [phone]   lumbar fusion   . CARPAL TUNNEL RELEASE Right 2000  . HERNIA REPAIR Right 2001   inguinal  Social History   Social History Narrative  . Not on file     Objective: Vital Signs: BP 139/86 (BP Location: Left Arm, Patient Position: Sitting, Cuff Size: Large)   Pulse (!) 101   Resp 13   Ht 6\' 4"  (1.93 m)   Wt 270 lb (122.5 kg)   BMI 32.87 kg/m     Physical Exam  Constitutional: He is oriented to person, place, and time. He appears well-developed and well-nourished.  HENT:  Head: Normocephalic and atraumatic.  Eyes: Conjunctivae and EOM are normal. Pupils are equal, round, and reactive to light.  Neck: Normal range of motion. Neck supple.  Cardiovascular: Normal rate, regular rhythm and normal heart sounds.  Pulmonary/Chest: Effort normal and breath sounds normal.  Abdominal: Soft. Bowel sounds are normal.  Neurological: He is alert and oriented to person, place, and time.  Skin: Skin is warm and dry. Capillary refill takes less than 2 seconds.  Psychiatric: He has a normal mood and affect. His behavior is normal.  Nursing note and vitals reviewed.    Musculoskeletal Exam: C-spine good ROM with discomfort.  Limited ROM of thoracic and lumbar due to previous spinal surgeries.  Right shoulder abduction limited to 90, left shoulder abduction to 120 degrees.  Elbows good ROM.  Limited wrist flexion bilaterally. MCPs, PIPs, and DIPs good ROM with no synovitis.  Knee joints full ROM with discomfort in left knee.  Poor balance.  Hip joints and ankle joints good ROM.  MTPs, PIPs ,and DIPs good ROM.     CDAI Exam: CDAI Homunculus Exam:   Joint Counts:  CDAI Tender Joint count: 0 CDAI Swollen Joint count: 0  Global Assessments:   Provider Global Assessment: 2    Investigation: No additional findings. TB Gold: 05/14/2017 Negative  CBC Latest Ref Rng & Units 05/14/2017 10/14/2016 12/18/2015  WBC 3.8 - 10.8 K/uL 15.1(H) 8.6 11.9(H)  Hemoglobin 13.2 - 17.1 g/dL 12/20/2015 69.6 11.4(L)  Hematocrit 38.5 - 50.0 % 46.6 44.5 33.9(L)  Platelets 140 - 400 K/uL 255 245 171   CMP Latest Ref Rng & Units 05/14/2017 10/14/2016 12/18/2015  Glucose 65 - 99 mg/dL 88 90 12/20/2015)  BUN 7 - 25 mg/dL 20 12 10   Creatinine 0.70 - 1.33 mg/dL 284(X 3.24  Sodium 135 - 146 mmol/L 137 142 139  Potassium 3.5 - 5.3 mmol/L 4.2 4.4 3.2(L)  Chloride 98 - 110 mmol/L  101 107 106  CO2 20 - 32 mmol/L 25 26 24   Calcium 8.6 - 10.3 mg/dL 9.3 9.3 4.01)  Total Protein 6.1 - 8.1 g/dL 6.1 6.3 -  Total Bilirubin 0.2 - 1.2 mg/dL 0.3 0.4 -  Alkaline Phos 40 - 115 U/L 53 60 -  AST 10 - 35 U/L 14 26 -  ALT 9 - 46 U/L 23 34 -    Imaging: No results found.  Speciality Comments: No specialty comments available.    Procedures:  No procedures performed Allergies: Eggs or egg-derived products   Assessment / Plan:     Visit Diagnoses: Rheumatoid arthritis, seronegative, multiple sites [facility]): No synovitis on exam.  Occasional hand pain and swelling reported.  Patient doing well on Arava 20 mg and Orencia combination.  Patient given a refill for Arava 20 mg. Patient is no longer on Prednisone.    High risk medication use - Orencia, Arava-Routine labs CBC and CMP were drawn today.  Repeat CBC and CMP every 3 months to monitor for drug toxicity. - Plan: CBC with Differential/Platelet, COMPLETE  METABOLIC PANEL WITH GFR, CBC with Differential/Platelet, COMPLETE METABOLIC PANEL WITH GFR  Primary osteoarthritis of both hands: Synovial thickening of PIP and DIP joints consistent with osteoarthritis.  No synovitis on exam.  Discussed joint protection and muscle strengthening.    Primary osteoarthritis of both knees: Continues to have pain in left knee.  No effusion or warmth on exam.  Patient states his balance has been worsening but due to his work schedule he declined physical therapy at this time. Declined cortisone injection at this time.    Primary osteoarthritis of both feet: Doing well.  No synovitis.    DDD (degenerative disc disease), cervical: continues to have slightly limited ROM with discomfort.    Spondylosis of lumbar region without myelopathy or radiculopathy: Chronic pain.  He recently was on Prednisone but is not on it currently.      Lumbar stenosis with neurogenic claudication: chronic   Other medical conditions are listed as follows:   History of  diverticulitis  History of colon polyps  History of obesity    Orders: Orders Placed This Encounter  Procedures  . CBC with Differential/Platelet  . COMPLETE METABOLIC PANEL WITH GFR  . CBC with Differential/Platelet  . COMPLETE METABOLIC PANEL WITH GFR   Meds ordered this encounter  Medications  . leflunomide (ARAVA) 20 MG tablet    Sig: Take 1 tablet (20 mg total) by mouth daily.    Dispense:  30 tablet    Refill:  2     Follow-Up Instructions: Return in about 4 months (around 01/19/2018) for Rheumatoid arthritis. Pollyann Savoy, MD  Note - This record has been created using Animal nutritionist.  Chart creation errors have been sought, but may not always  have been located. Such creation errors do not reflect on  the standard of medical care.

## 2017-09-20 ENCOUNTER — Ambulatory Visit: Payer: BLUE CROSS/BLUE SHIELD | Admitting: Rheumatology

## 2017-09-20 ENCOUNTER — Encounter: Payer: Self-pay | Admitting: Rheumatology

## 2017-09-20 VITALS — BP 139/86 | HR 101 | Resp 13 | Ht 76.0 in | Wt 270.0 lb

## 2017-09-20 DIAGNOSIS — M5030 Other cervical disc degeneration, unspecified cervical region: Secondary | ICD-10-CM

## 2017-09-20 DIAGNOSIS — M19071 Primary osteoarthritis, right ankle and foot: Secondary | ICD-10-CM

## 2017-09-20 DIAGNOSIS — M170 Bilateral primary osteoarthritis of knee: Secondary | ICD-10-CM | POA: Diagnosis not present

## 2017-09-20 DIAGNOSIS — Z8719 Personal history of other diseases of the digestive system: Secondary | ICD-10-CM | POA: Diagnosis not present

## 2017-09-20 DIAGNOSIS — Z8639 Personal history of other endocrine, nutritional and metabolic disease: Secondary | ICD-10-CM

## 2017-09-20 DIAGNOSIS — M47816 Spondylosis without myelopathy or radiculopathy, lumbar region: Secondary | ICD-10-CM

## 2017-09-20 DIAGNOSIS — M0609 Rheumatoid arthritis without rheumatoid factor, multiple sites: Secondary | ICD-10-CM | POA: Diagnosis not present

## 2017-09-20 DIAGNOSIS — M19042 Primary osteoarthritis, left hand: Secondary | ICD-10-CM | POA: Diagnosis not present

## 2017-09-20 DIAGNOSIS — Z86010 Personal history of colonic polyps: Secondary | ICD-10-CM | POA: Diagnosis not present

## 2017-09-20 DIAGNOSIS — M48062 Spinal stenosis, lumbar region with neurogenic claudication: Secondary | ICD-10-CM

## 2017-09-20 DIAGNOSIS — M19041 Primary osteoarthritis, right hand: Secondary | ICD-10-CM | POA: Diagnosis not present

## 2017-09-20 DIAGNOSIS — M19072 Primary osteoarthritis, left ankle and foot: Secondary | ICD-10-CM

## 2017-09-20 DIAGNOSIS — Z79899 Other long term (current) drug therapy: Secondary | ICD-10-CM

## 2017-09-20 MED ORDER — LEFLUNOMIDE 20 MG PO TABS: 20.0000 mg | ORAL_TABLET | Freq: Every day | ORAL | 2 refills | Status: DC

## 2017-09-20 NOTE — Patient Instructions (Signed)
Standing Labs We placed an order today for your standing lab work.    Please come back and get your standing labs in March and every 3 months following  We have open lab Monday through Friday from 8:30-11:30 AM and 1:30-4 PM at the office of Dr. Shaili Deveshwar.   The office is located at [address], [address], [place], [place] [address] No appointment is necessary.   Labs are drawn by [facility].  You may receive a bill from [facility] for your lab work. If you have any questions regarding directions or hours of operation,  please call [phone].     

## 2017-09-21 LAB — COMPLETE METABOLIC PANEL WITH GFR
AG Ratio: 2 (calc) (ref 1.0–2.5)
ALBUMIN MSPROF: 4 g/dL (ref 3.6–5.1)
ALKALINE PHOSPHATASE (APISO): 41 U/L (ref 40–115)
ALT: 23 U/L (ref 9–46)
AST: 13 U/L (ref 10–35)
BUN: 24 mg/dL (ref 7–25)
CALCIUM: 9.7 mg/dL (ref 8.6–10.3)
CO2: 29 mmol/L (ref 20–32)
CREATININE: 1.04 mg/dL (ref 0.70–1.33)
Chloride: 103 mmol/L (ref 98–110)
GFR, Est African American: 95 mL/min/{1.73_m2} (ref >=60)
GFR, Est Non African American: 82 mL/min/{1.73_m2} (ref >=60)
GLUCOSE: 77 mg/dL (ref 65–99)
Globulin: 2 g/dL (calc) (ref 1.9–3.7)
Potassium: 3.8 mmol/L (ref 3.5–5.3)
Sodium: 140 mmol/L (ref 135–146)
Total Bilirubin: 0.3 mg/dL (ref 0.2–1.2)
Total Protein: 6 g/dL — ABNORMAL LOW (ref 6.1–8.1)

## 2017-09-21 LAB — CBC WITH DIFFERENTIAL/PLATELET
BASOS ABS: 65 {cells}/uL (ref 0–200)
Basophils Relative: 0.5 %
EOS ABS: 335 {cells}/uL (ref 15–500)
EOS PCT: 2.6 %
HEMATOCRIT: 44.5 % (ref 38.5–50.0)
HEMOGLOBIN: 15.2 g/dL (ref 13.2–17.1)
LYMPHS ABS: 2774 {cells}/uL (ref 850–3900)
MCH: 29.6 pg (ref 27.0–33.0)
MCHC: 34.2 g/dL (ref 32.0–36.0)
MCV: 86.6 fL (ref 80.0–100.0)
MPV: 11.1 fL (ref 7.5–12.5)
Monocytes Relative: 7.8 %
NEUTROS ABS: 8720 {cells}/uL — AB (ref 1500–7800)
Neutrophils Relative %: 67.6 %
Platelets: 235 10*3/uL (ref 140–400)
RBC: 5.14 10*6/uL (ref 4.20–5.80)
RDW: 13 % (ref 11.0–15.0)
Total Lymphocyte: 21.5 %
WBC: 12.9 10*3/uL — ABNORMAL HIGH (ref 3.8–10.8)
WBCMIX: 1006 {cells}/uL — AB (ref 200–950)

## 2017-09-21 NOTE — Progress Notes (Signed)
WBC and absolute neutrophils remain stable.  We will continue to monitor.  All other labs WNL

## 2017-10-11 ENCOUNTER — Other Ambulatory Visit: Payer: Self-pay | Admitting: *Deleted

## 2017-10-11 MED ORDER — ABATACEPT 125 MG/ML ~~LOC~~ SOAJ: 125.0000 mg | SUBCUTANEOUS | 0 refills | Status: DC

## 2017-10-11 NOTE — Telephone Encounter (Signed)
Refill request received via fax  Last Visit: 09/20/17 Next Visit: 02/02/18 Labs: 09/20/17 WBC and absolute neutrophils remain stable. We will continue to monitor. All other labs WNL TB Gold: 05/14/17 Neg   Okay to refill per Dr. Corliss Skains

## 2017-11-01 ENCOUNTER — Telehealth: Payer: Self-pay | Admitting: Rheumatology

## 2017-11-01 NOTE — Telephone Encounter (Signed)
[place] from [employer] called regarding prior authorization for [employer].  Key Code is [id number].

## 2017-11-02 NOTE — Telephone Encounter (Signed)
Received a confirmation from cover my meds regarding a prior authorization approval for Orencia [employer] from 11/02/2017 thorough 10/04/2038.   Reference number: none  Phone number: none  Will send document to scan center once received  Called pt to update. Pt voices understanding and denies any questions at this time.    Oneal Schoenberger, [place], CPhT 11:28 AM

## 2017-11-24 ENCOUNTER — Emergency Department (HOSPITAL_COMMUNITY): Payer: BLUE CROSS/BLUE SHIELD

## 2017-11-24 ENCOUNTER — Other Ambulatory Visit: Payer: Self-pay

## 2017-11-24 ENCOUNTER — Emergency Department (HOSPITAL_COMMUNITY)
Admission: EM | Admit: 2017-11-24 | Discharge: 2017-11-24 | Disposition: A | Discharge status: Home / Self Care | Payer: BLUE CROSS/BLUE SHIELD | Attending: Emergency Medicine | Admitting: Emergency Medicine

## 2017-11-24 DIAGNOSIS — M25562 Pain in left knee: Secondary | ICD-10-CM | POA: Diagnosis present

## 2017-11-24 DIAGNOSIS — Z87891 Personal history of nicotine dependence: Secondary | ICD-10-CM | POA: Insufficient documentation

## 2017-11-24 DIAGNOSIS — Z79899 Other long term (current) drug therapy: Secondary | ICD-10-CM | POA: Insufficient documentation

## 2017-11-24 DIAGNOSIS — L03116 Cellulitis of left lower limb: Secondary | ICD-10-CM | POA: Diagnosis not present

## 2017-11-24 LAB — COMPREHENSIVE METABOLIC PANEL
ALK PHOS: 28 U/L — AB (ref 38–126)
ALT: 47 U/L (ref 17–63)
AST: 24 U/L (ref 15–41)
Albumin: 3 g/dL — ABNORMAL LOW (ref 3.5–5.0)
Anion gap: 12 (ref 5–15)
BILIRUBIN TOTAL: 0.4 mg/dL (ref 0.3–1.2)
BUN: 17 mg/dL (ref 6–20)
CALCIUM: 8.4 mg/dL — AB (ref 8.9–10.3)
CO2: 24 mmol/L (ref 22–32)
CREATININE: 1.15 mg/dL (ref 0.61–1.24)
Chloride: 103 mmol/L (ref 101–111)
GFR calc Af Amer: >60 mL/min (ref >60)
Glucose, Bld: 95 mg/dL (ref 65–99)
Potassium: 3.3 mmol/L — ABNORMAL LOW (ref 3.5–5.1)
Sodium: 139 mmol/L (ref 135–145)
TOTAL PROTEIN: 5.5 g/dL — AB (ref 6.5–8.1)

## 2017-11-24 LAB — CBC WITH DIFFERENTIAL/PLATELET
BASOS ABS: 0 10*3/uL (ref 0.0–0.1)
BASOS PCT: 0 %
EOS ABS: 0.3 10*3/uL (ref 0.0–0.7)
EOS PCT: 3 %
HCT: 44 % (ref 39.0–52.0)
Hemoglobin: 13.8 g/dL (ref 13.0–17.0)
Lymphocytes Relative: 13 %
Lymphs Abs: 1.1 10*3/uL (ref 0.7–4.0)
MCH: 28.9 pg (ref 26.0–34.0)
MCHC: 31.4 g/dL (ref 30.0–36.0)
MCV: 92.1 fL (ref 78.0–100.0)
MONO ABS: 0.7 10*3/uL (ref 0.1–1.0)
Monocytes Relative: 9 %
Neutro Abs: 6.3 10*3/uL (ref 1.7–7.7)
Neutrophils Relative %: 75 %
PLATELETS: 179 10*3/uL (ref 150–400)
RBC: 4.78 MIL/uL (ref 4.22–5.81)
RDW: 14 % (ref 11.5–15.5)
WBC: 8.4 10*3/uL (ref 4.0–10.5)

## 2017-11-24 LAB — I-STAT CG4 LACTIC ACID, ED
LACTIC ACID, VENOUS: 1.87 mmol/L (ref 0.5–1.9)
Lactic Acid, Venous: 1.47 mmol/L (ref 0.5–1.9)

## 2017-11-24 IMAGING — DX DG KNEE COMPLETE 4+V*L*
4 series · 4 of 4 positions shown · non-contrast
Comparison: None.

CLINICAL DATA: Left knee pain from rheumatoid arthritis. Fever
today.

EXAM:
LEFT KNEE - COMPLETE 4+ VIEW

[knee ap]
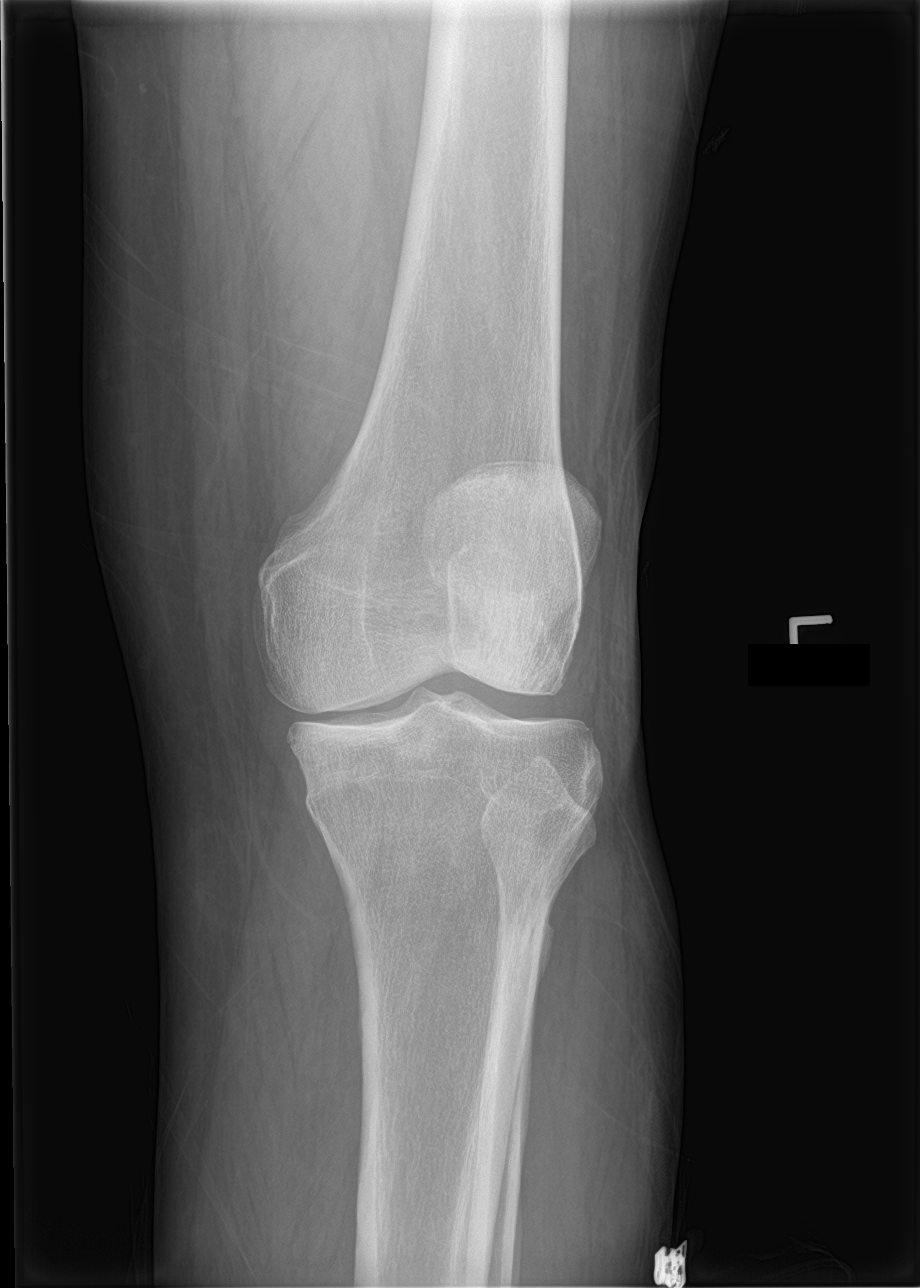

[knee lat]
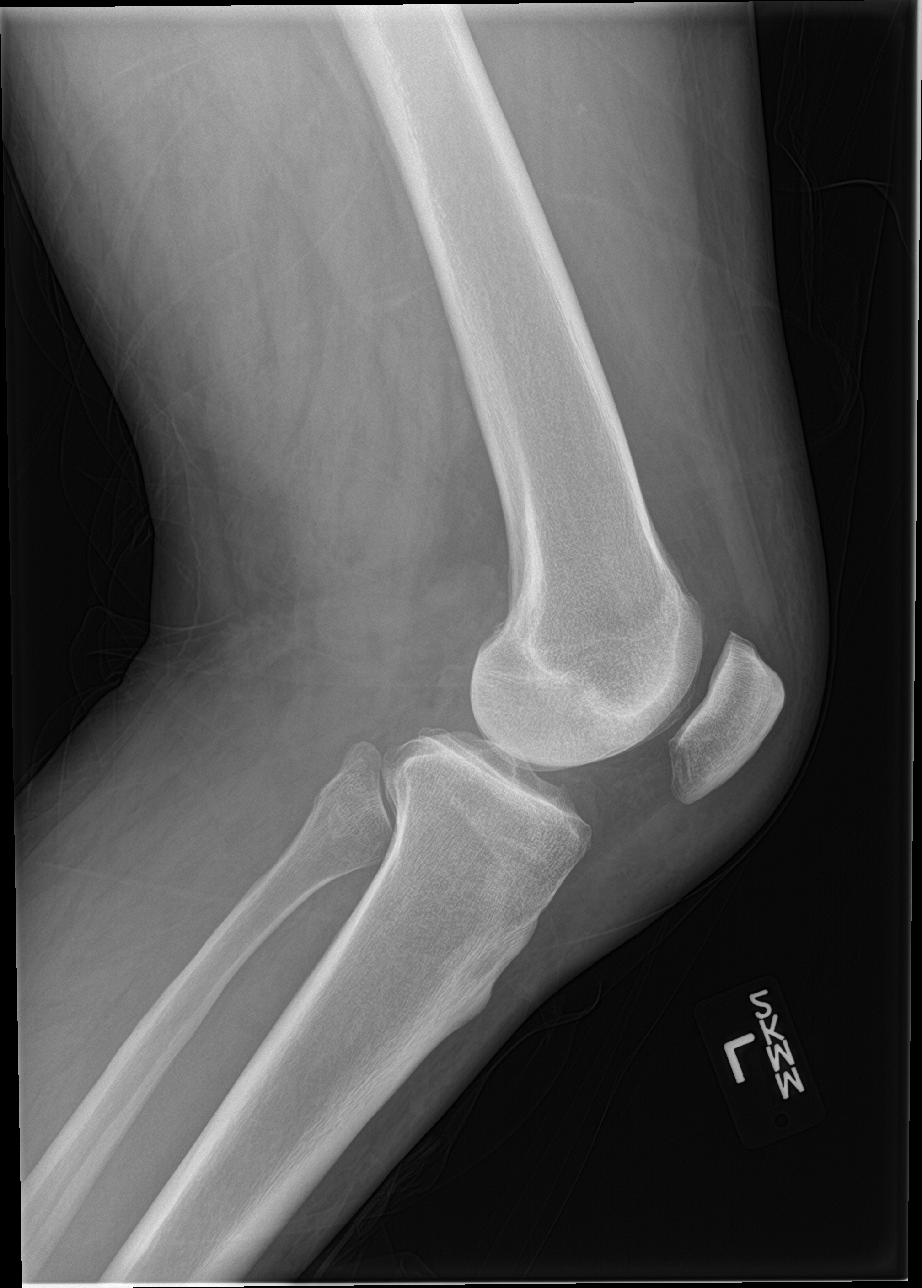

[knee obl (1 of 2)]
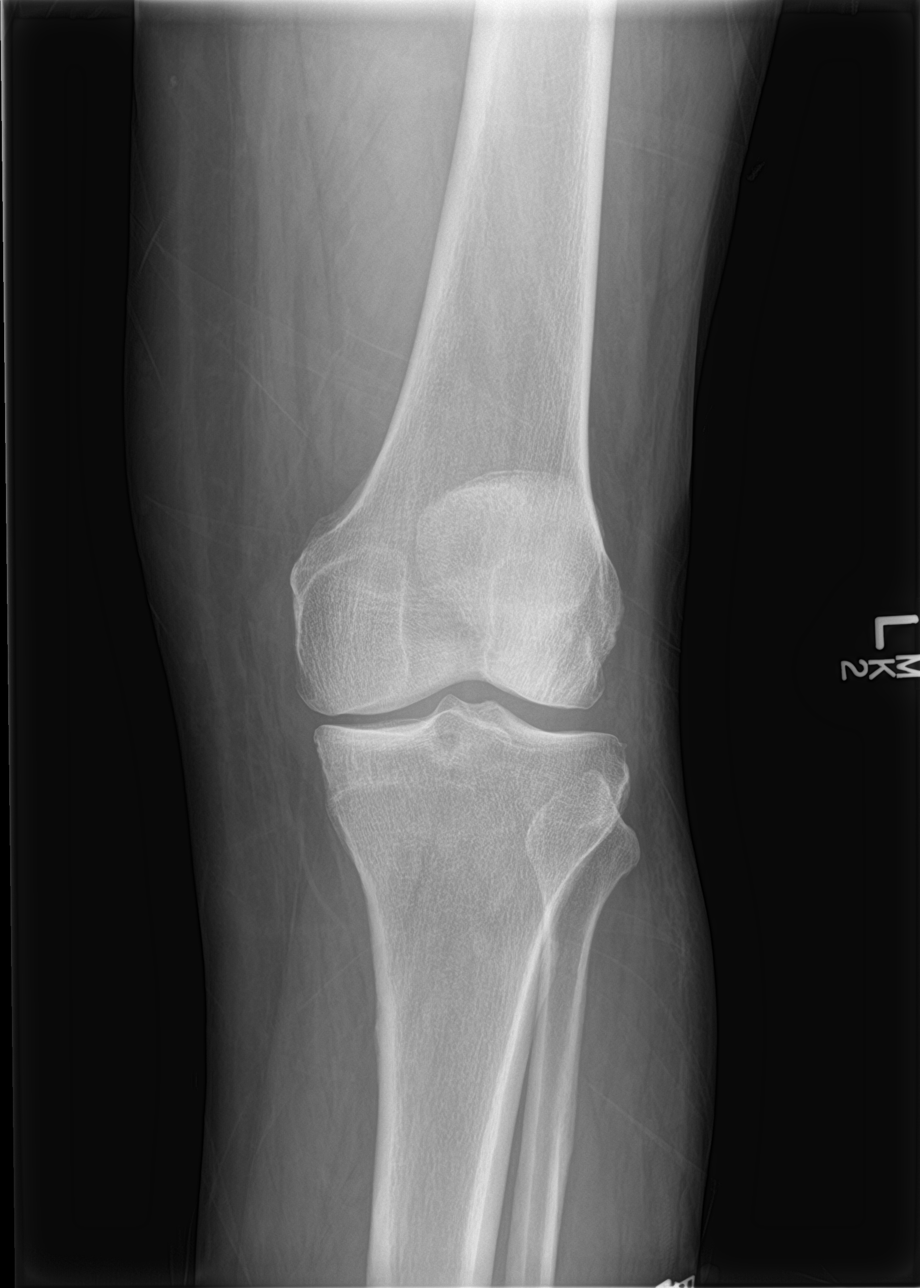

[knee obl (2 of 2)]
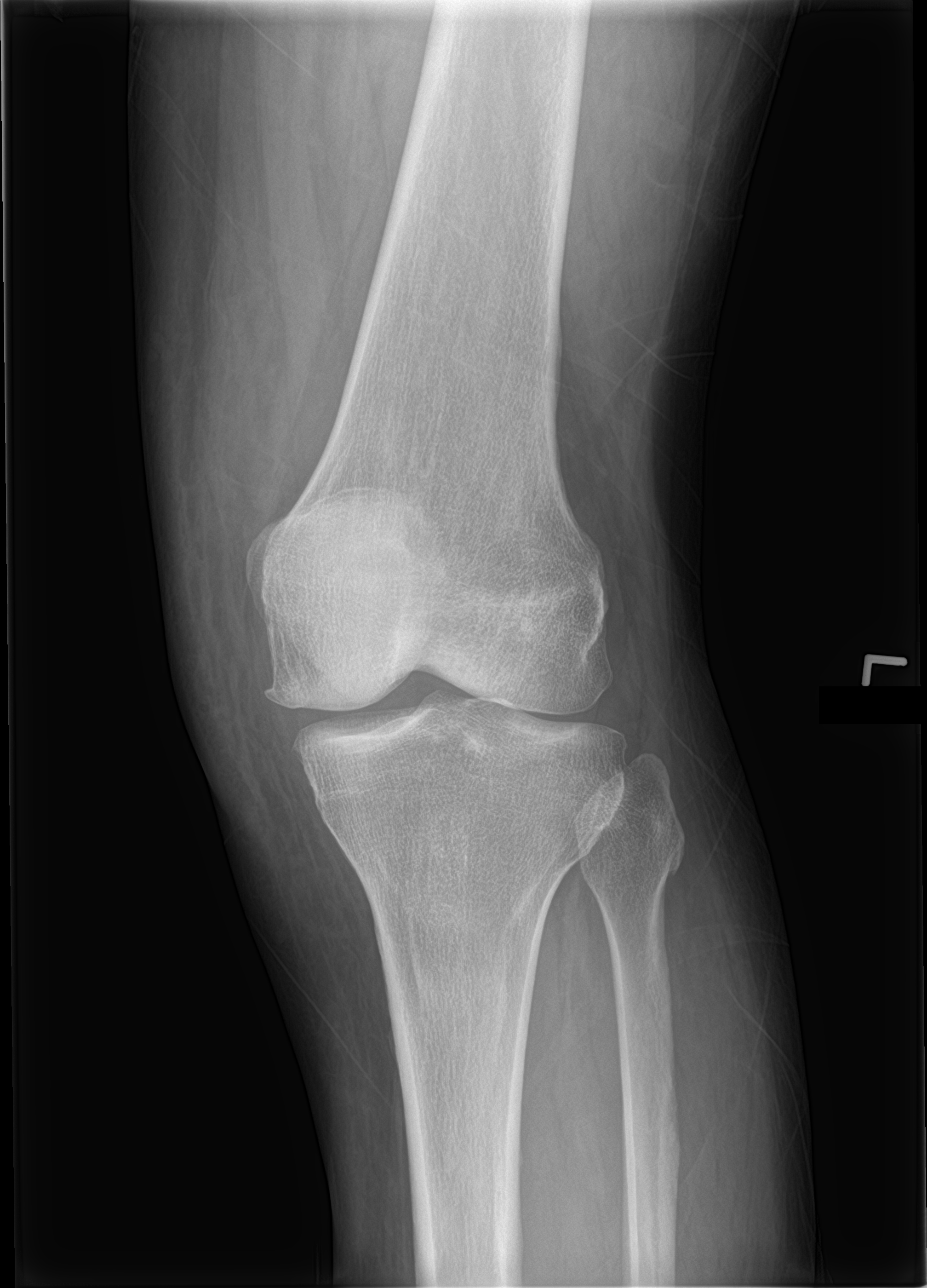

[4 of 4 positions shown; findings below may reference images not displayed]

FINDINGS: No evidence of fracture, dislocation, or joint effusion. Medial
femorotibial joint space narrowing is identified with degenerative
spurring off the medial femoral condyle. Soft tissues are
unremarkable.
IMPRESSION: Osteoarthritis of the medial femorotibial compartment. No acute
osseous abnormality.

## 2017-11-24 MED ORDER — CEFAZOLIN SODIUM-DEXTROSE 2-4 GM/100ML-% IV SOLN
2.0000 g | Freq: Once | INTRAVENOUS | Status: AC
  Administered 2017-11-24: 2 g via INTRAVENOUS
  Filled 2017-11-24: qty 100

## 2017-11-24 MED ORDER — SODIUM CHLORIDE 0.9 % IV BOLUS (SEPSIS)
1000.0000 mL | Freq: Once | INTRAVENOUS | Status: AC
  Administered 2017-11-24: 1000 mL via INTRAVENOUS

## 2017-11-24 MED ORDER — SULFAMETHOXAZOLE-TRIMETHOPRIM 800-160 MG PO TABS: 1.0000 | ORAL_TABLET | Freq: Two times a day (BID) | ORAL | 0 refills | Status: AC

## 2017-11-24 MED ORDER — CEPHALEXIN 500 MG PO CAPS: 500.0000 mg | ORAL_CAPSULE | Freq: Four times a day (QID) | ORAL | 0 refills | Status: DC

## 2017-11-24 NOTE — ED Notes (Signed)
Patient presents to ED for assessment of left knee pain, seen by his orthopedist today.  Referred here due to intermittent fevers.  Knee is red, swollen, and warm to the touch.

## 2017-11-24 NOTE — ED Provider Notes (Signed)
[facility] EMERGENCY DEPARTMENT Provider Note   CSN: [id number] Arrival date & time: 11/24/17  1930     History   Chief Complaint Chief Complaint  Patient presents with  . Knee Pain    HPI Gregory Hinton is a 52 y.o. male.  Patient with PMH of RA on prednisone and Orencia presents to the ED with a chief complaint of left knee pain.  He states that he noticed a small bump on the lateral aspect of his knee a few days ago.  He states that over the past day or so he has had worsening pain and spreading of the redness to the inside of his leg.  He reports fever to to 101 at home.  He states that he called his PCP and was sent to an orthopedic, who got an x-ray and said that it wasn't the joint and sent him to the ED.  He denies pain with movement.  Denies any insect bites, scratches, or any other superficial injury.   The history is provided by the patient. No language interpreter was used.    Past Medical History:  Diagnosis Date  . Anxiety   . Arthritis   . H/O cardiovascular stress test 2014   admitted at hosp. overnight for chest pain, stress/echo test wnl  . Rheumatoid arthritis (HCC)    manages by Dr. Corliss Skains, rec'd Orencia weekly for 2 months, switched from Humira     Patient Active Problem List   Diagnosis Date Noted  . History of diverticulitis 05/04/2017  . History of colon polyps 05/04/2017  . Primary osteoarthritis of both hands 05/04/2017  . Primary osteoarthritis of both feet 05/04/2017  . Primary osteoarthritis of both knees 04/02/2017  . Class 1 obesity without serious comorbidity with body mass index (BMI) of 32.0 to 32.9 in adult 10/14/2016  . Spondylosis of lumbar region without myelopathy or radiculopathy 10/14/2016  . DJD (degenerative joint disease), cervical 10/14/2016  . High risk medications (not anticoagulants) long-term use 10/14/2016  . Rheumatoid arthritis, seronegative, multiple sites (HCC) 10/14/2016  . Lumbar  stenosis with neurogenic claudication 12/17/2015    Past Surgical History:  Procedure Laterality Date  . BACK SURGERY  [phone]   lumbar fusion   . CARPAL TUNNEL RELEASE Right 2000  . HERNIA REPAIR Right 2001   inguinal        Home Medications    Prior to Admission medications   Medication Sig Start Date End Date Taking? Authorizing Provider  Abatacept (ORENCIA CLICKJECT) 125 MG/ML SOAJ Inject 125 mg into the skin once a week. 10/11/17   Pollyann Savoy, MD  buPROPion (WELLBUTRIN XL) 300 MG 24 hr tablet Take 300 mg by mouth daily.    [provider]  cholecalciferol (VITAMIN D) 1000 units tablet Take 1,000 Units by mouth 2 (two) times daily.    [provider]  folic acid (FOLVITE) 1 MG tablet Take 1 mg by mouth 2 (two) times daily.    [provider]  ibuprofen (ADVIL,MOTRIN) 200 MG tablet Take 400 mg by mouth every 8 (eight) hours as needed for mild pain or moderate pain.    [provider]  leflunomide (ARAVA) 20 MG tablet Take 1 tablet (20 mg total) by mouth daily. 09/20/17   Gearldine Bienenstock, PA-C  predniSONE (DELTASONE) 5 MG tablet 4po qAM x3 days,  3po qAM x 3 days, 2po qAM x 3 days, 1po qAM x 3 days, 1/2po qAM x3 days, then stop. Patient not taking:  Reported on 09/20/2017 04/05/17   Pollyann Savoy, MD    Family History No family history on file.  Social History Social History   Tobacco Use  . Smoking status: Former Games developer  . Smokeless tobacco: Former Neurosurgeon    Quit date: 12/09/2011  Substance Use Topics  . Alcohol use: Yes    Alcohol/week: 2.4 oz    Types: 4 Cans of beer per week    Comment: 4  beers per week  . Drug use: No     Allergies   Eggs or egg-derived products   Review of Systems Review of Systems  All other systems reviewed and are negative.    Physical Exam Updated Vital Signs BP 134/76 (BP Location: Right Arm)   Pulse (!) 117   Temp 98.7 F (37.1 C) (Oral)   Resp 18   Ht 6\' 4"  (1.93 m)   Wt 117.9 kg  (260 lb)   SpO2 100%   BMI 31.65 kg/m   Physical Exam Nursing note and vitals reviewed.  Constitutional: Pt appears well-developed and well-nourished. No distress.  HENT:  Head: Normocephalic and atraumatic.  Eyes: Conjunctivae are normal.  Neck: Normal range of motion.  Cardiovascular: Normal rate, regular rhythm. Intact distal pulses.   Capillary refill < 3 sec.  Pulmonary/Chest: Effort normal and breath sounds normal.  Musculoskeletal:  Erythema about the medial aspect of the left knee, no abscess or visible/palpable effusion Pt exhibits no bony abnormality or deformity.   ROM: 4/5  Strength: 5/5  Neurological: Pt  is alert. Coordination normal.  Sensation: 5/5 Skin: Skin is warm and dry. Pt is not diaphoretic.  No evidence of open wound or skin tenting, see [facility] Psychiatric: Pt has a normal mood and affect.     ED Treatments / Results  Labs (all labs ordered are listed, but only abnormal results are displayed) Labs Reviewed  COMPREHENSIVE METABOLIC PANEL - Abnormal; Notable for the following components:      Result Value   Potassium 3.3 (*)    Calcium 8.4 (*)    Total Protein 5.5 (*)    Albumin 3.0 (*)    Alkaline Phosphatase 28 (*)    All other components within normal limits  CBC WITH DIFFERENTIAL/PLATELET  I-STAT CG4 LACTIC ACID, ED  I-STAT CG4 LACTIC ACID, ED    EKG  EKG Interpretation None       Radiology Dg Knee Complete 4 Views Left  Result Date: 11/24/2017 CLINICAL DATA:  Left knee pain from rheumatoid arthritis. Fever today. EXAM: LEFT KNEE - COMPLETE 4+ VIEW COMPARISON:  None. FINDINGS: No evidence of fracture, dislocation, or joint effusion. Medial femorotibial joint space narrowing is identified with degenerative spurring off the medial femoral condyle. Soft tissues are unremarkable. IMPRESSION: Osteoarthritis of the medial femorotibial compartment. No acute osseous abnormality. Electronically Signed   By: Tollie Eth M.D.   On: 11/24/2017 20:34      Procedures Procedures (including critical care time)  Medications Ordered in ED Medications  ceFAZolin (ANCEF) IVPB 2g/100 mL premix (not administered)     Initial Impression / Assessment and Plan / ED Course  I have reviewed the triage vital signs and the nursing notes.  Pertinent labs & imaging results that were available during my care of the patient were reviewed by me and considered in my medical decision making (see chart for details).    Patient with cellulitic changes to the left medial knee.  He has no effusion on physical exam or on x-ray.  He  has good ROM.  Highly doubt septic joint.  Seen by ortho today, and was sent to the ED for evaluation of skin infection.    On prednisone and orencia for RA.  Appears non-toxic.  Seen by and discussed with Dr. Fayrene Fearing, who agrees with plan for outpatient treatment of cellulitis.  Will give dose of ancef here.  Verified no medication interactions witn Romeo Apple PharmD.  Return precautions given.  Final Clinical Impressions(s) / ED Diagnoses   Final diagnoses:  Cellulitis of left lower extremity    ED Discharge Orders        Ordered    cephALEXin (KEFLEX) 500 MG capsule  4 times daily     11/24/17 2319    sulfamethoxazole-trimethoprim (BACTRIM DS,SEPTRA DS) 800-160 MG tablet  2 times daily     11/24/17 2319       Roxy Horseman, PA-C 11/24/17 2326    Rolland Porter, MD 11/25/17 2340

## 2017-11-29 ENCOUNTER — Telehealth: Payer: Self-pay | Admitting: Rheumatology

## 2017-11-29 NOTE — Telephone Encounter (Signed)
Patient was in hospital last Thursday/ Friday for Cellulitis in lt knee. Patient wants to check to see if he should still be holding off on taking Orencia. Please call to advise.

## 2017-11-29 NOTE — Telephone Encounter (Signed)
Patient states he was recently in the hospital and has cellulitis in his knee. Patient was given antibiotics in the hospital and also sent home with antibiotics. Patient advise not to take his Orencia until he completes the antibiocs and is well. Patient is also on [place]. He would like to know if he should hold this as well.

## 2017-11-29 NOTE — Telephone Encounter (Signed)
Attempted to contact patient and left message for patient to call the office.  

## 2017-11-29 NOTE — Telephone Encounter (Signed)
Yes hold both Orencia and Arava.

## 2017-11-30 NOTE — Telephone Encounter (Signed)
Patient advised of to hold Arava as well. Patient verbalized understanding.

## 2017-11-30 NOTE — Telephone Encounter (Signed)
Attempted to contact the patient and unable to leave a message. Mailbox is full.  

## 2017-12-21 ENCOUNTER — Encounter: Payer: Self-pay | Admitting: Gastroenterology

## 2018-01-19 NOTE — Progress Notes (Signed)
Office Visit Note  Patient: Gregory Hinton             Date of Birth: Feb 04, 1966           MRN: [id number]             PCP: Irena Reichmann, DO Referring: Irena Reichmann, DO Visit Date: 02/02/2018 Occupation: @GUAROCC @    Subjective:  Pain in both hands.   History of Present Illness: Gregory Hinton is a 52 y.o. male with history of rheumatoid arthritis and osteoarthritis overlap.  He developed cellulitis on his left knee in February 2019.  He had to come off Orencia for about 3 weeks.  He states he resumed Orencia after 3 weeks but his arthritis flared and is a still having discomfort.  He describes pain and discomfort in his bilateral shoulders and bilateral hands.  He is having difficulty working due to decreased grip strength.  He continues to have some lower back discomfort due to underlying disc disease of lumbar spine.  Activities of Daily Living:  Patient reports morning stiffness for 2 hours.   Patient Reports nocturnal pain.  Difficulty dressing/grooming: Denies Difficulty climbing stairs: Reports Difficulty getting out of chair: Denies Difficulty using hands for taps, buttons, cutlery, and/or writing: Reports   Review of Systems  Constitutional: Positive for fatigue. Negative for night sweats.  HENT: Negative for mouth sores, mouth dryness and nose dryness.   Eyes: Negative for redness and dryness.  Respiratory: Negative for shortness of breath and difficulty breathing.   Cardiovascular: Positive for hypertension. Negative for chest pain, palpitations, irregular heartbeat and swelling in legs/feet.  Gastrointestinal: Positive for constipation and diarrhea.  Endocrine: Negative for increased urination.  Musculoskeletal: Positive for arthralgias, joint pain, joint swelling and morning stiffness. Negative for myalgias, muscle weakness, muscle tenderness and myalgias.  Skin: Negative for color change, rash, hair loss, nodules/bumps, skin tightness, ulcers and  sensitivity to sunlight.  Allergic/Immunologic: Negative for susceptible to infections.  Neurological: Negative for dizziness, fainting, memory loss, night sweats and weakness ( ).  Hematological: Negative for swollen glands.  Psychiatric/Behavioral: Positive for sleep disturbance. Negative for depressed mood. The patient is not nervous/anxious.     PMFS History:  Patient Active Problem List   Diagnosis Date Noted  . History of diverticulitis 05/04/2017  . History of colon polyps 05/04/2017  . Primary osteoarthritis of both hands 05/04/2017  . Primary osteoarthritis of both feet 05/04/2017  . Primary osteoarthritis of both knees 04/02/2017  . Class 1 obesity without serious comorbidity with body mass index (BMI) of 32.0 to 32.9 in adult 10/14/2016  . Spondylosis of lumbar region without myelopathy or radiculopathy 10/14/2016  . DJD (degenerative joint disease), cervical 10/14/2016  . High risk medications (not anticoagulants) long-term use 10/14/2016  . Rheumatoid arthritis, seronegative, multiple sites (HCC) 10/14/2016  . Lumbar stenosis with neurogenic claudication 12/17/2015    Past Medical History:  Diagnosis Date  . Anxiety   . Arthritis   . H/O cardiovascular stress test 2014   admitted at hosp. overnight for chest pain, stress/echo test wnl  . Rheumatoid arthritis (HCC)    manages by Dr. 2015, rec'd Orencia weekly for 2 months, switched from Humira     Family History  Problem Relation Age of Onset  . Aneurysm Mother   . Healthy Daughter   . Healthy Son    Past Surgical History:  Procedure Laterality Date  . BACK SURGERY  [phone]   lumbar fusion   . CARPAL TUNNEL RELEASE  Right 2000  . HERNIA REPAIR Right 2001   inguinal    Social History   Social History Narrative  . Not on file     Objective: Vital Signs: BP (!) 160/102 (BP Location: Right Arm, Patient Position: Sitting, Cuff Size: Large)   Pulse (!) 114   Ht 6\' 4"  (1.93 m)   Wt 275 lb (124.7 kg)    BMI 33.47 kg/m    Physical Exam  Constitutional: He is oriented to person, place, and time. He appears well-developed and well-nourished.  HENT:  Head: Normocephalic and atraumatic.  Eyes: Pupils are equal, round, and reactive to light. Conjunctivae and EOM are normal.  Neck: Normal range of motion. Neck supple.  Cardiovascular: Normal rate, regular rhythm and normal heart sounds.  Pulmonary/Chest: Effort normal and breath sounds normal.  Abdominal: Soft. Bowel sounds are normal.  Neurological: He is alert and oriented to person, place, and time.  Skin: Skin is warm and dry. Capillary refill takes less than 2 seconds.  Psychiatric: He has a normal mood and affect. His behavior is normal.  Nursing note and vitals reviewed.    Musculoskeletal Exam: C-spine thoracic lumbar spine limited range of motion.  Bilateral shoulder joint abduction was limited to 90 degrees which was painful.  Elbow joints and wrist joints are good range of motion.  He has some tenderness across MCPs and PIPs with no synovitis.  Hip joints knee joints ankles with good range of motion without any synovitis.  CDAI Exam: CDAI Homunculus Exam:   Joint Counts:  CDAI Tender Joint count: 0 CDAI Swollen Joint count: 0  Global Assessments:  Patient Global Assessment: 7 Provider Global Assessment: 7  CDAI Calculated Score: 14    Investigation: No additional findings.TB Gold: 05/14/2017 Negative  CBC Latest Ref Rng & Units 11/24/2017 09/20/2017 05/14/2017  WBC 4.0 - 10.5 K/uL 8.4 12.9(H) 15.1(H)  Hemoglobin 13.0 - 17.0 g/dL 06.2 37.6 28.3  Hematocrit 39.0 - 52.0 % 44.0 44.5 46.6  Platelets 150 - 400 K/uL 179 235 255   CMP Latest Ref Rng & Units 11/24/2017 09/20/2017 05/14/2017  Glucose 65 - 99 mg/dL 95 77 88  BUN 6 - 20 mg/dL 17 24 20   Creatinine 0.61 - 1.24 mg/dL 1.51 7.61 6.07  Sodium 135 - 145 mmol/L 139 140 137  Potassium 3.5 - 5.1 mmol/L 3.3(L) 3.8 4.2  Chloride 101 - 111 mmol/L 103 103 101  CO2 22 - 32  mmol/L 24 29 25   Calcium 8.9 - 10.3 mg/dL 3.7(T) 9.7 9.3  Total Protein 6.5 - 8.1 g/dL 0.6(Y) 6.0(L) 6.1  Total Bilirubin 0.3 - 1.2 mg/dL 0.4 0.3 0.3  Alkaline Phos 38 - 126 U/L 28(L) - 53  AST 15 - 41 U/L 24 13 14   ALT 17 - 63 U/L 47 23 23    Imaging: No results found.  Speciality Comments: No specialty comments available.    Procedures:  No procedures performed Allergies: Poultry meal and Eggs or egg-derived products   Assessment / Plan:     Visit Diagnoses: Rheumatoid arthritis, seronegative, multiple sites (HCC)-patient is having increased pain and discomfort.  He had difficulty raising his arms and working due to pain and discomfort in his bilateral hands.  He states the symptoms started after he had to come off Orencia due to cellulitis in February.  I would like to start him on a prednisone taper but was unable to do so due to elevated blood pressure.  I have advised him to schedule an appointment  with his PCP for the treatment of hypertension.  Once his blood pressure is well controlled-give him a prednisone taper.  I would also like to reassess him in 2 months and see how he does on the combination of Orencia and Arava after prednisone taper.  He has failed two TNF anti-TNF's in the past.  High risk medication use - Orencia sq  qwk, Arava 20 mg po qd ( Failed Enbrel and Humira) - Plan: CBC with Differential/Platelet, COMPLETE METABOLIC PANEL WITH GFR  Primary osteoarthritis of both hands-joint protection muscle strengthening was discussed.  Primary osteoarthritis of both knees-he has some ongoing pain in his knee joints.  Primary osteoarthritis of both feet  DDD (degenerative disc disease), cervical-chronic pain  DDD (degenerative disc disease), lumbar - Status post fusion x2 by Dr. Danielle Dess.  History of a spinal stenosis in the past.  He continues to have some discomfort in his lower back.  History of obesity-weight loss diet and exercise was discussed.  History of  diverticulitis  History of colon polyps     Orders: No orders of the defined types were placed in this encounter.  No orders of the defined types were placed in this encounter.   Face-to-face time spent with patient was 30 minutes. >50% of time was spent in counseling and coordination of care.  Follow-Up Instructions: Return in about 2 months (around 04/04/2018) for Rheumatoid arthritis.   Pollyann Savoy, MD  Note - This record has been created using Animal nutritionist.  Chart creation errors have been sought, but may not always  have been located. Such creation errors do not reflect on  the standard of medical care.

## 2018-01-26 ENCOUNTER — Other Ambulatory Visit: Payer: Self-pay | Admitting: Rheumatology

## 2018-01-26 NOTE — Telephone Encounter (Signed)
Last Visit: 09/20/17 Next Visit: 02/02/18 Labs: 11/24/17 calcium 8.4, potassium 3.3 TB Gold: 05/14/17 Neg   Okay to refill per Dr. Corliss Skains

## 2018-02-02 ENCOUNTER — Ambulatory Visit: Payer: BLUE CROSS/BLUE SHIELD | Admitting: Rheumatology

## 2018-02-02 ENCOUNTER — Encounter: Payer: Self-pay | Admitting: Rheumatology

## 2018-02-02 VITALS — BP 160/102 | HR 114 | Ht 76.0 in | Wt 275.0 lb

## 2018-02-02 DIAGNOSIS — M5030 Other cervical disc degeneration, unspecified cervical region: Secondary | ICD-10-CM

## 2018-02-02 DIAGNOSIS — M0609 Rheumatoid arthritis without rheumatoid factor, multiple sites: Secondary | ICD-10-CM

## 2018-02-02 DIAGNOSIS — M170 Bilateral primary osteoarthritis of knee: Secondary | ICD-10-CM

## 2018-02-02 DIAGNOSIS — Z79899 Other long term (current) drug therapy: Secondary | ICD-10-CM | POA: Diagnosis not present

## 2018-02-02 DIAGNOSIS — Z860100 Personal history of colon polyps, unspecified: Secondary | ICD-10-CM

## 2018-02-02 DIAGNOSIS — Z8639 Personal history of other endocrine, nutritional and metabolic disease: Secondary | ICD-10-CM

## 2018-02-02 DIAGNOSIS — M19042 Primary osteoarthritis, left hand: Secondary | ICD-10-CM

## 2018-02-02 DIAGNOSIS — Z8719 Personal history of other diseases of the digestive system: Secondary | ICD-10-CM

## 2018-02-02 DIAGNOSIS — M19072 Primary osteoarthritis, left ankle and foot: Secondary | ICD-10-CM

## 2018-02-02 DIAGNOSIS — M19041 Primary osteoarthritis, right hand: Secondary | ICD-10-CM

## 2018-02-02 DIAGNOSIS — M51369 Other intervertebral disc degeneration, lumbar region without mention of lumbar back pain or lower extremity pain: Secondary | ICD-10-CM

## 2018-02-02 DIAGNOSIS — M19071 Primary osteoarthritis, right ankle and foot: Secondary | ICD-10-CM

## 2018-02-02 DIAGNOSIS — M5136 Other intervertebral disc degeneration, lumbar region: Secondary | ICD-10-CM | POA: Diagnosis not present

## 2018-02-02 LAB — CBC WITH DIFFERENTIAL/PLATELET
BASOS PCT: 0.4 %
Basophils Absolute: 67 cells/uL (ref 0–200)
Eosinophils Absolute: 0 cells/uL — ABNORMAL LOW (ref 15–500)
Eosinophils Relative: 0 %
HCT: 45.8 % (ref 38.5–50.0)
Hemoglobin: 15.4 g/dL (ref 13.2–17.1)
LYMPHS ABS: 1687 {cells}/uL (ref 850–3900)
MCH: 28.4 pg (ref 27.0–33.0)
MCHC: 33.6 g/dL (ref 32.0–36.0)
MCV: 84.3 fL (ref 80.0–100.0)
MPV: 11.2 fL (ref 7.5–12.5)
Monocytes Relative: 5.1 %
NEUTROS ABS: 14095 {cells}/uL — AB (ref 1500–7800)
Neutrophils Relative %: 84.4 %
PLATELETS: 268 10*3/uL (ref 140–400)
RBC: 5.43 10*6/uL (ref 4.20–5.80)
RDW: 14.4 % (ref 11.0–15.0)
TOTAL LYMPHOCYTE: 10.1 %
WBC: 16.7 10*3/uL — AB (ref 3.8–10.8)
WBCMIX: 852 {cells}/uL (ref 200–950)

## 2018-02-02 NOTE — Patient Instructions (Signed)
Standing Labs We placed an order today for your standing lab work.    Please come back and get your standing labs in August and every 3 months   We have open lab Monday through Friday from 8:30-11:30 AM and 1:30-4:00 PM  at the office of Dr. Phoenicia Pirie.   You may experience shorter wait times on Monday and Friday afternoons. The office is located at [address], [address], [place], [place] [address] No appointment is necessary.   Labs are drawn by [facility].  You may receive a bill from [facility] for your lab work. If you have any questions regarding directions or hours of operation,  please call [phone].    

## 2018-02-03 LAB — COMPLETE METABOLIC PANEL WITH GFR
AG Ratio: 2.3 (calc) (ref 1.0–2.5)
ALBUMIN MSPROF: 4.3 g/dL (ref 3.6–5.1)
ALKALINE PHOSPHATASE (APISO): 39 U/L — AB (ref 40–115)
ALT: 26 U/L (ref 9–46)
AST: 15 U/L (ref 10–35)
BILIRUBIN TOTAL: 0.3 mg/dL (ref 0.2–1.2)
BUN: 25 mg/dL (ref 7–25)
CHLORIDE: 104 mmol/L (ref 98–110)
CO2: 30 mmol/L (ref 20–32)
Calcium: 9.7 mg/dL (ref 8.6–10.3)
Creat: 1.04 mg/dL (ref 0.70–1.33)
GFR, Est African American: 95 mL/min/{1.73_m2} (ref >=60)
GFR, Est Non African American: 82 mL/min/{1.73_m2} (ref >=60)
GLOBULIN: 1.9 g/dL (ref 1.9–3.7)
Glucose, Bld: 112 mg/dL — ABNORMAL HIGH (ref 65–99)
POTASSIUM: 4.3 mmol/L (ref 3.5–5.3)
SODIUM: 141 mmol/L (ref 135–146)
Total Protein: 6.2 g/dL (ref 6.1–8.1)

## 2018-02-03 NOTE — Progress Notes (Signed)
Glucose mildly elevated.  WBC is very elevated.  Please notify patient.  Please ask him if he has recently had an infection.  He if has please have him follow up with PCP.  We should recheck WBC in 1 week.

## 2018-02-08 ENCOUNTER — Telehealth: Payer: Self-pay | Admitting: Physician Assistant

## 2018-02-08 NOTE — Telephone Encounter (Signed)
Dr. Maurine Cane in [place] called about Gregory Hinton who is a mutual patient.  The patient is being seen in his office today for a noninvasive procedure working on bridge work.  Gregory Hinton had labs drawn on 02/02/18 that revealed a WBC of 16.7.  He was advised to return 1 week after lab work to recheck CBC.  He was also advised to follow up with PCP.  He denied any recent infections or Prednisone use.  He is currently on [place].  Due to it being a noninvasive procedure and the patient returning for recheck of CBC he can proceed with seeing Dr. Tiburcio Pea today.

## 2018-02-11 ENCOUNTER — Other Ambulatory Visit: Payer: Self-pay

## 2018-02-11 ENCOUNTER — Telehealth: Payer: Self-pay | Admitting: Rheumatology

## 2018-02-11 ENCOUNTER — Other Ambulatory Visit: Payer: Self-pay | Admitting: *Deleted

## 2018-02-11 DIAGNOSIS — Z79899 Other long term (current) drug therapy: Secondary | ICD-10-CM

## 2018-02-11 NOTE — Telephone Encounter (Signed)
Lab orders faxed.

## 2018-02-11 NOTE — Telephone Encounter (Signed)
Patient called requesting his labwork orders be sent to Dr. Etter Sjogren, PCP at [facility] [place].  Patient states he is going today due to Dr. Corliss Skains wanting a repeat blood test. Please call patient to let him know when the orders have been sent.   [facility] phone #[phone]  Fax #[phone]

## 2018-02-12 LAB — CBC WITH DIFFERENTIAL/PLATELET
Basophils Absolute: 71 cells/uL (ref 0–200)
Basophils Relative: 0.5 %
EOS ABS: 212 {cells}/uL (ref 15–500)
Eosinophils Relative: 1.5 %
HCT: 44.5 % (ref 38.5–50.0)
Hemoglobin: 15 g/dL (ref 13.2–17.1)
Lymphs Abs: 3017 cells/uL (ref 850–3900)
MCH: 28.8 pg (ref 27.0–33.0)
MCHC: 33.7 g/dL (ref 32.0–36.0)
MCV: 85.6 fL (ref 80.0–100.0)
MONOS PCT: 6.1 %
MPV: 11 fL (ref 7.5–12.5)
Neutro Abs: 9941 cells/uL — ABNORMAL HIGH (ref 1500–7800)
Neutrophils Relative %: 70.5 %
PLATELETS: 250 10*3/uL (ref 140–400)
RBC: 5.2 10*6/uL (ref 4.20–5.80)
RDW: 14.4 % (ref 11.0–15.0)
TOTAL LYMPHOCYTE: 21.4 %
WBC: 14.1 10*3/uL — ABNORMAL HIGH (ref 3.8–10.8)
WBCMIX: 860 {cells}/uL (ref 200–950)

## 2018-02-14 NOTE — Progress Notes (Signed)
WBC trending down.  We will continue to monitor with routine lab work.

## 2018-02-18 ENCOUNTER — Telehealth: Payer: Self-pay | Admitting: Gastroenterology

## 2018-02-18 ENCOUNTER — Ambulatory Visit (AMBULATORY_SURGERY_CENTER): Payer: Self-pay

## 2018-02-18 VITALS — Ht 75.0 in | Wt 274.0 lb

## 2018-02-18 DIAGNOSIS — Z86010 Personal history of colonic polyps: Secondary | ICD-10-CM

## 2018-02-18 MED ORDER — NA SULFATE-K SULFATE-MG SULF 17.5-3.13-1.6 GM/177ML PO SOLN: 1.0000 | Freq: Once | ORAL | 0 refills | Status: AC

## 2018-02-18 NOTE — Progress Notes (Signed)
Per pt, no allergies to soy. Pt not taking any weight loss meds or using  O2 at home.  Pt refused emmi video.   EGG PRODUCTS CAUSE DIARRHEA!

## 2018-02-18 NOTE — Telephone Encounter (Signed)
Called the pharmacist at [facility] and spoke to [place]. Gave coupon info with BIN number to pharmacist and she ran it thru regarding the [employer] Pt will pay only $50.00 for the prep.   Called pt and notified of the price. Pt states he will be able to pay for the Suprep now. He will pick up the Suprep later and call back if he has further questions. Cherylann Ratel

## 2018-02-18 NOTE — Telephone Encounter (Signed)
Called pt, unable to leave message due to the mailbox was full. Will try again later. Cherylann Ratel

## 2018-03-04 ENCOUNTER — Encounter: Payer: BLUE CROSS/BLUE SHIELD | Admitting: Gastroenterology

## 2018-03-07 ENCOUNTER — Encounter: Payer: Self-pay | Admitting: Gastroenterology

## 2018-03-07 ENCOUNTER — Other Ambulatory Visit: Payer: Self-pay

## 2018-03-07 ENCOUNTER — Ambulatory Visit (AMBULATORY_SURGERY_CENTER): Payer: BLUE CROSS/BLUE SHIELD | Admitting: Gastroenterology

## 2018-03-07 VITALS — BP 146/93 | HR 100 | Temp 99.3°F | Resp 17 | Ht 75.0 in | Wt 275.0 lb

## 2018-03-07 DIAGNOSIS — Z86010 Personal history of colonic polyps: Secondary | ICD-10-CM | POA: Diagnosis present

## 2018-03-07 MED ORDER — SODIUM CHLORIDE 0.9 % IV SOLN: 500.0000 mL | Freq: Once | INTRAVENOUS | Status: DC

## 2018-03-07 NOTE — Progress Notes (Signed)
To recovery, report to RN, VSS. 

## 2018-03-07 NOTE — Progress Notes (Signed)
[facility] is aware of the patient's egg allergy.

## 2018-03-07 NOTE — Op Note (Signed)
[facility] Patient Name: Gregory Hinton Procedure Date: 03/07/2018 2:00 PM MRN: [id number] Endoscopist: Jackquline Denmark , MD Age: 52 Referring MD:  Date of Birth: 01/13/1966 Gender: Male Account #: [id number] Procedure:                Colonoscopy Indications:              High risk colon cancer surveillance: Personal                            history of colonic polyps Medicines:                Monitored Anesthesia Care Procedure:                Pre-Anesthesia Assessment:                           - Prior to the procedure, a History and Physical                            was performed, and patient medications and                            allergies were reviewed. The patient is competent.                            The risks and benefits of the procedure and the                            sedation options and risks were discussed with the                            patient. All questions were answered and informed                            consent was obtained. Patient identification and                            proposed procedure were verified by the physician                            in the procedure room. Mental Status Examination:                            alert and oriented. Prophylactic Antibiotics: The                            patient does not require prophylactic antibiotics.                            Prior Anticoagulants: The patient has taken no                            previous anticoagulant or antiplatelet agents. ASA  Grade Assessment: II - A patient with mild systemic                            disease. After reviewing the risks and benefits,                            the patient was deemed in satisfactory condition to                            undergo the procedure. The anesthesia plan was to                            use monitored anesthesia care (MAC). Immediately                            prior to administration of  medications, the patient                            was re-assessed for adequacy to receive sedatives.                            The heart rate, respiratory rate, oxygen                            saturations, blood pressure, adequacy of pulmonary                            ventilation, and response to care were monitored                            throughout the procedure. The physical status of                            the patient was re-assessed after the procedure.                           After obtaining informed consent, the colonoscope                            was passed under direct vision. Throughout the                            procedure, the patient's blood pressure, pulse, and                            oxygen saturations were monitored continuously. The                            Colonoscope was introduced through the anus and                            advanced to the 2 cm into the ileum. The  colonoscopy was performed without difficulty. The                            patient tolerated the procedure well. The quality                            of the bowel preparation was excellent. Scope In: 2:06:05 PM Scope Out: 2:20:13 PM Scope Withdrawal Time: 0 hours 10 minutes 13 seconds  Total Procedure Duration: 0 hours 14 minutes 8 seconds  Findings:                 A few small-mouthed diverticula were found in the                            sigmoid colon and ascending colon.                           Internal hemorrhoids were found during                            retroflexion. The hemorrhoids were small.                           The exam was otherwise without abnormality. Complications:            No immediate complications. Estimated Blood Loss:     Estimated blood loss: none. Impression:               - Mild pancolonic diverticulosis.                           - Otherwise normal colonoscopy to terminal ileum. Recommendation:           -  Patient has a contact number available for                            emergencies. The signs and symptoms of potential                            delayed complications were discussed with the                            patient. Return to normal activities tomorrow.                            Written discharge instructions were provided to the                            patient.                           - Resume previous diet.                           - Continue present medications.                           -  Repeat colonoscopy in 10 years for screening                            purposes. Earlier, if he starts having any new                            problems or if there is any change in family                            history.                           - Return to GI clinic PRN. Jackquline Denmark, MD 03/07/2018 2:25:31 PM This report has been signed electronically.

## 2018-03-07 NOTE — Patient Instructions (Signed)
**  Handouts given on Diverticulosis and hemorrhoids**   YOU HAD AN ENDOSCOPIC PROCEDURE TODAY: Refer to the procedure report and other information in the discharge instructions given to you for any specific questions about what was found during the examination. If this information does not answer your questions, please call [place] office at [phone] to clarify.   YOU SHOULD EXPECT: Some feelings of bloating in the abdomen. Passage of more gas than usual. Walking can help get rid of the air that was put into your GI tract during the procedure and reduce the bloating. If you had a lower endoscopy (such as a colonoscopy or flexible sigmoidoscopy) you may notice spotting of blood in your stool or on the toilet paper. Some abdominal soreness may be present for a day or two, also.  DIET: Your first meal following the procedure should be a light meal and then it is ok to progress to your normal diet. A half-sandwich or bowl of soup is an example of a good first meal. Heavy or fried foods are harder to digest and may make you feel nauseous or bloated. Drink plenty of fluids but you should avoid alcoholic beverages for 24 hours. If you had a esophageal dilation, please see attached instructions for diet.    ACTIVITY: Your care partner should take you home directly after the procedure. You should plan to take it easy, moving slowly for the rest of the day. You can resume normal activity the day after the procedure however YOU SHOULD NOT DRIVE, use power tools, machinery or perform tasks that involve climbing or major physical exertion for 24 hours (because of the sedation medicines used during the test).   SYMPTOMS TO REPORT IMMEDIATELY: A gastroenterologist can be reached at any hour. Please call [phone]  for any of the following symptoms:  Following lower endoscopy (colonoscopy, flexible sigmoidoscopy) Excessive amounts of blood in the stool  Significant tenderness, worsening of abdominal pains   Swelling of the abdomen that is new, acute  Fever of 100 or higher    FOLLOW UP:  If any biopsies were taken you will be contacted by phone or by letter within the next 1-3 weeks. Call [phone]  if you have not heard about the biopsies in 3 weeks.  Please also call with any specific questions about appointments or follow up tests.

## 2018-03-07 NOTE — Progress Notes (Signed)
Pt's states no medical or surgical changes since previsit or office visit. 

## 2018-03-08 ENCOUNTER — Telehealth: Payer: Self-pay | Admitting: *Deleted

## 2018-03-08 NOTE — Telephone Encounter (Signed)
No answer, second call.  Left message to call if questions or concerns. 

## 2018-03-08 NOTE — Telephone Encounter (Signed)
No answer, left message to call if questions or concerns. 

## 2018-04-05 NOTE — Progress Notes (Signed)
Office Visit Note  Patient: Gregory Hinton             Date of Birth: July 28, 1966           MRN: [id number]             PCP: Irena Reichmann, DO Referring: Irena Reichmann, DO Visit Date: 04/19/2018 Occupation: @GUAROCC @    Subjective:  Pain in multiple joints   History of Present Illness: Gregory Hinton is a 52 y.o. male with history of seronegative rheumatoid arthritis, osteoarthritis, and DDD.  Patient injects Orencia subcutaneously every week and takes Arava 20 mg po qd.  He reports pain and swelling in both hands and wrists.  He reports he is also having pain in bilateral shoulder joints and bilateral feet.  He is having difficulty walking due to the pain.  He reports his shoulder pain has been waking him up at night.  He continues to see Dr. 40 for management of lower back pain.  He is scheduled for an epidural steroid injection soon.  He states he will also be having surgery in October/Novemeber 2019.    Activities of Daily Living:  Patient reports morning stiffness for 4-6 hours.   Patient Reports nocturnal pain.  Difficulty dressing/grooming: Denies Difficulty climbing stairs: Reports Difficulty getting out of chair: Reports Difficulty using hands for taps, buttons, cutlery, and/or writing: Reports   Review of Systems  Constitutional: Positive for fatigue. Negative for night sweats.  HENT: Negative for mouth sores, trouble swallowing, trouble swallowing, mouth dryness and nose dryness.   Eyes: Negative for redness, visual disturbance and dryness.  Respiratory: Negative for cough, hemoptysis, shortness of breath and difficulty breathing.   Cardiovascular: Negative for chest pain, palpitations, hypertension, irregular heartbeat and swelling in legs/feet.  Gastrointestinal: Negative for blood in stool, constipation and diarrhea.  Endocrine: Negative for increased urination.  Genitourinary: Negative for painful urination.  Musculoskeletal: Positive for arthralgias,  joint pain, joint swelling and morning stiffness. Negative for myalgias, muscle weakness, muscle tenderness and myalgias.  Skin: Negative for color change, rash, hair loss, nodules/bumps, skin tightness, ulcers and sensitivity to sunlight.  Allergic/Immunologic: Negative for susceptible to infections.  Neurological: Negative for dizziness, fainting, memory loss, night sweats and weakness.  Hematological: Negative for swollen glands.  Psychiatric/Behavioral: Positive for depressed mood (on Wellbutrin). Negative for sleep disturbance. The patient is nervous/anxious.     PMFS History:  Patient Active Problem List   Diagnosis Date Noted  . High risk medication use 04/19/2018  . History of diverticulitis 05/04/2017  . History of colon polyps 05/04/2017  . Primary osteoarthritis of both hands 05/04/2017  . Primary osteoarthritis of both feet 05/04/2017  . Primary osteoarthritis of both knees 04/02/2017  . Class 1 obesity without serious comorbidity with body mass index (BMI) of 32.0 to 32.9 in adult 10/14/2016  . Spondylosis of lumbar region without myelopathy or radiculopathy 10/14/2016  . DJD (degenerative joint disease), cervical 10/14/2016  . High risk medications (not anticoagulants) long-term use 10/14/2016  . Rheumatoid arthritis, seronegative, multiple sites (HCC) 10/14/2016  . Lumbar stenosis with neurogenic claudication 12/17/2015    Past Medical History:  Diagnosis Date  . Anxiety   . Arthritis   . Back pain    had 3 fusions surgeries  . H/O cardiovascular stress test 2014   admitted at hosp. overnight for chest pain, stress/echo test wnl  . Rheumatoid arthritis (HCC)    manages by Dr. 2015, rec'd Orencia weekly for 2 months, switched from [facility]  Family History  Problem Relation Age of Onset  . Aneurysm Mother   . Leukemia Father   . Healthy Daughter   . Healthy Son    Past Surgical History:  Procedure Laterality Date  . BACK SURGERY  [phone]   lumbar  fusion 3 times  . CARPAL TUNNEL RELEASE Right 2000   and left side 2 years ago with left ulnar surgery  . HERNIA REPAIR Right 2001   inguinal    Social History   Social History Narrative  . Not on file     Objective: Vital Signs: BP 128/74 (BP Location: Left Arm, Patient Position: Sitting, Cuff Size: Large)   Pulse (!) 111   Resp 14   Ht 6\' 4"  (1.93 m)   Wt 287 lb (130.2 kg)   BMI 34.93 kg/m    Physical Exam  Constitutional: He is oriented to person, place, and time. He appears well-developed and well-nourished.  HENT:  Head: Normocephalic and atraumatic.  Eyes: Pupils are equal, round, and reactive to light. Conjunctivae and EOM are normal.  Neck: Normal range of motion. Neck supple.  Cardiovascular: Normal rate, regular rhythm and normal heart sounds.  Pulmonary/Chest: Effort normal and breath sounds normal.  Abdominal: Soft. Bowel sounds are normal.  Lymphadenopathy:    He has no cervical adenopathy.  Neurological: He is alert and oriented to person, place, and time.  Skin: Skin is warm and dry. Capillary refill takes less than 2 seconds.  Psychiatric: He has a normal mood and affect. His behavior is normal.  Nursing note and vitals reviewed.    Musculoskeletal Exam: C-spine limited ROM with lateral rotation.  Mild thoracic kyphosis.  Limited ROM of lumbar spine.  Shoulder abduction to 90 degrees bilaterally.  Elbow joints, wrist joints, MCPs, PIPs, and DIPs good ROM with no synovitis.  MCP tenderness.  Hip joints, knee joints, ankle joints, MTPs, PIPs, and DIPs good ROM with no synovitis.  No warmth or effusion of knee joints.  No tenderness of trochanteric bursa bilaterally.   CDAI Exam: CDAI Homunculus Exam:   Tenderness:  Right hand: 1st MCP, 2nd MCP, 3rd MCP, 4th MCP and 5th MCP Left hand: 1st MCP, 2nd MCP, 3rd MCP, 4th MCP and 5th MCP  Joint Counts:  CDAI Tender Joint count: 10 CDAI Swollen Joint count: 0  Global Assessments:  Patient Global Assessment:  5 Provider Global Assessment: 5  CDAI Calculated Score: 20    Investigation: No additional findings.Tb gold: 05/14/2017 Negative  CBC Latest Ref Rng & Units 02/11/2018 02/02/2018 11/24/2017  WBC 3.8 - 10.8 Thousand/uL 14.1(H) 16.7(H) 8.4  Hemoglobin 13.2 - 17.1 g/dL 70.6 23.7 62.8  Hematocrit 38.5 - 50.0 % 44.5 45.8 44.0  Platelets 140 - 400 Thousand/uL 250 268 179   CMP Latest Ref Rng & Units 02/02/2018 11/24/2017 09/20/2017  Glucose 65 - 99 mg/dL 315(V) 95 77  BUN 7 - 25 mg/dL 25 17 24   Creatinine 0.70 - 1.33 mg/dL 7.61 6.07 3.71  Sodium 135 - 146 mmol/L 141 139 140  Potassium 3.5 - 5.3 mmol/L 4.3 3.3(L) 3.8  Chloride 98 - 110 mmol/L 104 103 103  CO2 20 - 32 mmol/L 30 24 29   Calcium 8.6 - 10.3 mg/dL 9.7 0.6(Y) 9.7  Total Protein 6.1 - 8.1 g/dL 6.2 6.9(S) 6.0(L)  Total Bilirubin 0.2 - 1.2 mg/dL 0.3 0.4 0.3  Alkaline Phos 38 - 126 U/L - 28(L) -  AST 10 - 35 U/L 15 24 13   ALT 9 - 46 U/L 26  47 23    Imaging: No results found.  Speciality Comments: No specialty comments available.    Procedures:  No procedures performed Allergies: Eggs or egg-derived products   Assessment / Plan:     Visit Diagnoses: Rheumatoid arthritis, seronegative, multiple sites [facility]): He has no synovitis on exam.  He has been having increased pain in multiple joints including bilateral hands, bilateral wrists, bilateral shoulder joints, and bilateral feet. He continues to inject Orenica subcutaneously once a week and takes Arava 20 mg po qd.  He has not missed any doses recently.  He has failed MTX, PLQ, Enbrel, and Humira in the past.  Due to his history of diverticulitis, Harriette Ohara is not an option.  Since he has no synovitis at this time, he will continue on Orencia subq injections q wk and Arava 20 mg daily.  We will check a sed rate with his next labs in September.  He was advised to notify us if he develops increased joint pain or joint swelling.  We also discussed switching from Wellbutrin to Cymbalta for  better management of his pain by his PCP.- Plan: Sedimentation rate  High risk medication use - Orencia sq  qwk, Arava 20 mg po qd ( Failed Enbrel and Humira).  CBC and CMP are drawn on 03/26/2018.  She will be due for lab work and in September and every 3 months to monitor for drug toxicity.  A sed rate will be checked with his next lab work.  Primary osteoarthritis of both hands: He has been having increased pain in bilateral hands.  No synovitis was noted.  Joint protection muscle strengthening were discussed.  Primary osteoarthritis of both knees: No warmth or effusion of bilateral knee joints.  He has no discomfort at this time.  Primary osteoarthritis of both feet: He has been having increased pain in bilateral feet.  He was proper fitting shoes.  He has been having some difficulty walking due to pain and stiffness in his feet.  DDD (degenerative disc disease), cervical: He has limited lateral rotation.  DDD (degenerative disc disease), lumbar - Status post fusion x2 by Dr. Danielle Dess.  History of a spinal stenosis in the past.  He is scheduled for an epidural steroid injection soon.  He continues following up with Dr. Danielle Dess.  He reports that he will be needing a third lumbar surgery and is planning on having it scheduled it in October or November 2019.  Other medical conditions are listed as follows:  History of diverticulitis  History of colon polyps  History of obesity    Orders: Orders Placed This Encounter  Procedures  . Sedimentation rate   No orders of the defined types were placed in this encounter.    Follow-Up Instructions: Return in about 5 months (around 09/19/2018) for Rheumatoid arthritis, Osteoarthritis, DDD.   Gearldine Bienenstock, PA-C  Note - This record has been created using Dragon software.  Chart creation errors have been sought, but may not always  have been located. Such creation errors do not reflect on  the standard of medical care.

## 2018-04-19 ENCOUNTER — Encounter: Payer: Self-pay | Admitting: Physician Assistant

## 2018-04-19 ENCOUNTER — Ambulatory Visit (INDEPENDENT_AMBULATORY_CARE_PROVIDER_SITE_OTHER): Payer: BLUE CROSS/BLUE SHIELD | Admitting: Physician Assistant

## 2018-04-19 VITALS — BP 128/74 | HR 111 | Resp 14 | Ht 76.0 in | Wt 287.0 lb

## 2018-04-19 DIAGNOSIS — M19042 Primary osteoarthritis, left hand: Secondary | ICD-10-CM

## 2018-04-19 DIAGNOSIS — Z86010 Personal history of colonic polyps: Secondary | ICD-10-CM

## 2018-04-19 DIAGNOSIS — Z8639 Personal history of other endocrine, nutritional and metabolic disease: Secondary | ICD-10-CM

## 2018-04-19 DIAGNOSIS — M5030 Other cervical disc degeneration, unspecified cervical region: Secondary | ICD-10-CM | POA: Diagnosis not present

## 2018-04-19 DIAGNOSIS — M5136 Other intervertebral disc degeneration, lumbar region: Secondary | ICD-10-CM

## 2018-04-19 DIAGNOSIS — M19041 Primary osteoarthritis, right hand: Secondary | ICD-10-CM | POA: Diagnosis not present

## 2018-04-19 DIAGNOSIS — M170 Bilateral primary osteoarthritis of knee: Secondary | ICD-10-CM | POA: Diagnosis not present

## 2018-04-19 DIAGNOSIS — Z79899 Other long term (current) drug therapy: Secondary | ICD-10-CM | POA: Insufficient documentation

## 2018-04-19 DIAGNOSIS — M19071 Primary osteoarthritis, right ankle and foot: Secondary | ICD-10-CM

## 2018-04-19 DIAGNOSIS — Z8719 Personal history of other diseases of the digestive system: Secondary | ICD-10-CM

## 2018-04-19 DIAGNOSIS — M19072 Primary osteoarthritis, left ankle and foot: Secondary | ICD-10-CM | POA: Diagnosis not present

## 2018-04-19 DIAGNOSIS — M0609 Rheumatoid arthritis without rheumatoid factor, multiple sites: Secondary | ICD-10-CM | POA: Diagnosis not present

## 2018-04-19 NOTE — Patient Instructions (Signed)
Standing Labs We placed an order today for your standing lab work.    Please come back and get your standing labs in September and every 3 months   We have open lab Monday through Friday from 8:30-11:30 AM and 1:30-4:00 PM  at the office of Dr. Shaili Deveshwar.   You may experience shorter wait times on Monday and Friday afternoons. The office is located at [address], [address], [place], [place] [address] No appointment is necessary.   Labs are drawn by [facility].  You may receive a bill from [facility] for your lab work. If you have any questions regarding directions or hours of operation,  please call [phone].    

## 2018-04-21 ENCOUNTER — Other Ambulatory Visit: Payer: Self-pay | Admitting: Neurological Surgery

## 2018-04-29 ENCOUNTER — Other Ambulatory Visit: Payer: Self-pay | Admitting: Rheumatology

## 2018-05-02 NOTE — Telephone Encounter (Signed)
Last Visit: 04/19/18 Next Visit: 09/20/18 Labs: 02/11/18 WBC trending down. We will continue to monitor with routine lab work.  TB Gold: 05/14/17 Neg   Okay to refill per Dr. Corliss Skains

## 2018-05-10 ENCOUNTER — Telehealth: Payer: Self-pay

## 2018-05-10 MED ORDER — LEFLUNOMIDE 20 MG PO TABS: 20.0000 mg | ORAL_TABLET | Freq: Every day | ORAL | 2 refills | Status: DC

## 2018-05-10 NOTE — Telephone Encounter (Signed)
Received refill request via fax.   Last visit: 04/19/2018 Next visit: 09/20/2018 Labs: 03/22/2018  Okay to refill per Dr. Corliss Skains.

## 2018-06-08 ENCOUNTER — Telehealth: Payer: Self-pay

## 2018-06-08 DIAGNOSIS — Z79899 Other long term (current) drug therapy: Secondary | ICD-10-CM

## 2018-06-08 MED ORDER — ABATACEPT 125 MG/ML ~~LOC~~ SOAJ: 125.0000 mg | SUBCUTANEOUS | 0 refills | Status: DC

## 2018-06-08 NOTE — Telephone Encounter (Signed)
Received refill request via fax.   Last visit: 04/19/2018 Next visit: 09/20/2018 Labs: 02/11/2018 WBC trending down. We will continue to monitor with routine lab work.  TB Gold: 05/14/2017 negative   Advised patient he is due to update labs, patient states he will call his PCP and have labs faxed over. I advised patient we also need a TB Gold and patient verbalized understanding and will come to have it drawn.   Okay to refill 30 day supply, per Dr. Corliss Skains.

## 2018-06-10 ENCOUNTER — Other Ambulatory Visit: Payer: Self-pay

## 2018-06-10 DIAGNOSIS — M0609 Rheumatoid arthritis without rheumatoid factor, multiple sites: Secondary | ICD-10-CM

## 2018-06-10 DIAGNOSIS — Z79899 Other long term (current) drug therapy: Secondary | ICD-10-CM

## 2018-06-13 NOTE — Progress Notes (Signed)
Sed rate WNL. Glucose mildly low.  Potassium borderline low.  Alk phos mildly low.  WBC elevated.  Please ask patient if he has had any recent signs or symptoms of an infection.  I discussed labs with Dr. Estanislado Pandy.  Please forward labs to PCP as well.

## 2018-06-15 LAB — COMPLETE METABOLIC PANEL WITH GFR
AG Ratio: 1.6 (calc) (ref 1.0–2.5)
ALKALINE PHOSPHATASE (APISO): 33 U/L — AB (ref 40–115)
ALT: 43 U/L (ref 9–46)
AST: 20 U/L (ref 10–35)
Albumin: 3.9 g/dL (ref 3.6–5.1)
BILIRUBIN TOTAL: 0.5 mg/dL (ref 0.2–1.2)
BUN/Creatinine Ratio: 24 (calc) — ABNORMAL HIGH (ref 6–22)
BUN: 29 mg/dL — ABNORMAL HIGH (ref 7–25)
CHLORIDE: 97 mmol/L — AB (ref 98–110)
CO2: 31 mmol/L (ref 20–32)
Calcium: 10.1 mg/dL (ref 8.6–10.3)
Creat: 1.21 mg/dL (ref 0.70–1.33)
GFR, Est African American: 79 mL/min/{1.73_m2} (ref >=60)
GFR, Est Non African American: 68 mL/min/{1.73_m2} (ref >=60)
GLUCOSE: 62 mg/dL — AB (ref 65–99)
Globulin: 2.4 g/dL (calc) (ref 1.9–3.7)
Potassium: 3.4 mmol/L — ABNORMAL LOW (ref 3.5–5.3)
Sodium: 140 mmol/L (ref 135–146)
Total Protein: 6.3 g/dL (ref 6.1–8.1)

## 2018-06-15 LAB — CBC WITH DIFFERENTIAL/PLATELET
BASOS ABS: 82 {cells}/uL (ref 0–200)
Basophils Relative: 0.5 %
EOS PCT: 0.2 %
Eosinophils Absolute: 33 cells/uL (ref 15–500)
HCT: 48.2 % (ref 38.5–50.0)
Hemoglobin: 16.5 g/dL (ref 13.2–17.1)
LYMPHS ABS: 2624 {cells}/uL (ref 850–3900)
MCH: 29 pg (ref 27.0–33.0)
MCHC: 34.2 g/dL (ref 32.0–36.0)
MCV: 84.7 fL (ref 80.0–100.0)
MONOS PCT: 8.5 %
MPV: 10.6 fL (ref 7.5–12.5)
NEUTROS PCT: 74.8 %
Neutro Abs: 12267 cells/uL — ABNORMAL HIGH (ref 1500–7800)
Platelets: 252 10*3/uL (ref 140–400)
RBC: 5.69 10*6/uL (ref 4.20–5.80)
RDW: 13.4 % (ref 11.0–15.0)
Total Lymphocyte: 16 %
WBC mixed population: 1394 cells/uL — ABNORMAL HIGH (ref 200–950)
WBC: 16.4 10*3/uL — AB (ref 3.8–10.8)

## 2018-06-15 LAB — QUANTIFERON-TB GOLD PLUS
NIL: 0.04 [IU]/mL
QuantiFERON-TB Gold Plus: NEGATIVE
TB1-NIL: 0 [IU]/mL
TB2-NIL: 0 IU/mL

## 2018-06-15 LAB — SEDIMENTATION RATE: SED RATE: 2 mm/h (ref 0–20)

## 2018-06-21 ENCOUNTER — Encounter (HOSPITAL_COMMUNITY): Payer: Self-pay | Admitting: Emergency Medicine

## 2018-06-21 ENCOUNTER — Emergency Department (HOSPITAL_COMMUNITY)
Admission: EM | Admit: 2018-06-21 | Discharge: 2018-06-21 | Disposition: A | Discharge status: Home / Self Care | Payer: BLUE CROSS/BLUE SHIELD | Attending: Emergency Medicine | Admitting: Emergency Medicine

## 2018-06-21 ENCOUNTER — Other Ambulatory Visit: Payer: Self-pay

## 2018-06-21 DIAGNOSIS — Z79899 Other long term (current) drug therapy: Secondary | ICD-10-CM | POA: Diagnosis not present

## 2018-06-21 DIAGNOSIS — L03011 Cellulitis of right finger: Secondary | ICD-10-CM | POA: Diagnosis not present

## 2018-06-21 DIAGNOSIS — Z87891 Personal history of nicotine dependence: Secondary | ICD-10-CM | POA: Insufficient documentation

## 2018-06-21 DIAGNOSIS — IMO0002 Reserved for concepts with insufficient information to code with codable children: Secondary | ICD-10-CM

## 2018-06-21 DIAGNOSIS — R600 Localized edema: Secondary | ICD-10-CM | POA: Diagnosis present

## 2018-06-21 LAB — CBC WITH DIFFERENTIAL/PLATELET
Abs Immature Granulocytes: 0.2 10*3/uL — ABNORMAL HIGH (ref 0.0–0.1)
Basophils Absolute: 0 10*3/uL (ref 0.0–0.1)
Basophils Relative: 0 %
Eosinophils Absolute: 0.1 10*3/uL (ref 0.0–0.7)
Eosinophils Relative: 1 %
HCT: 44.6 % (ref 39.0–52.0)
Hemoglobin: 14.5 g/dL (ref 13.0–17.0)
Immature Granulocytes: 2 %
Lymphocytes Relative: 20 %
Lymphs Abs: 2.3 10*3/uL (ref 0.7–4.0)
MCH: 28.9 pg (ref 26.0–34.0)
MCHC: 32.5 g/dL (ref 30.0–36.0)
MCV: 89 fL (ref 78.0–100.0)
Monocytes Absolute: 0.9 10*3/uL (ref 0.1–1.0)
Monocytes Relative: 7 %
Neutro Abs: 8.2 10*3/uL — ABNORMAL HIGH (ref 1.7–7.7)
Neutrophils Relative %: 70 %
Platelets: 211 10*3/uL (ref 150–400)
RBC: 5.01 MIL/uL (ref 4.22–5.81)
RDW: 14 % (ref 11.5–15.5)
WBC: 11.7 10*3/uL — ABNORMAL HIGH (ref 4.0–10.5)

## 2018-06-21 LAB — BASIC METABOLIC PANEL
Anion gap: 10 (ref 5–15)
BUN: 16 mg/dL (ref 6–20)
CO2: 24 mmol/L (ref 22–32)
Calcium: 9.2 mg/dL (ref 8.9–10.3)
Chloride: 105 mmol/L (ref 98–111)
Creatinine, Ser: 0.96 mg/dL (ref 0.61–1.24)
GFR calc Af Amer: >60 mL/min (ref >60)
GFR calc non Af Amer: >60 mL/min (ref >60)
Glucose, Bld: 86 mg/dL (ref 70–99)
Potassium: 3.7 mmol/L (ref 3.5–5.1)
Sodium: 139 mmol/L (ref 135–145)

## 2018-06-21 MED ORDER — BUPIVACAINE HCL 0.5 % IJ SOLN: 50.0000 mL | Freq: Once | INTRAMUSCULAR | Status: DC

## 2018-06-21 MED ORDER — BUPIVACAINE HCL (PF) 0.5 % IJ SOLN
30.0000 mL | Freq: Once | INTRAMUSCULAR | Status: AC
  Administered 2018-06-21: 30 mL
  Filled 2018-06-21: qty 30

## 2018-06-21 MED ORDER — DOXYCYCLINE HYCLATE 100 MG PO CAPS: 100.0000 mg | ORAL_CAPSULE | Freq: Two times a day (BID) | ORAL | 1 refills | Status: DC

## 2018-06-21 MED ORDER — LIDOCAINE HCL 2 % IJ SOLN
20.0000 mL | Freq: Once | INTRAMUSCULAR | Status: AC
  Administered 2018-06-21: 400 mg
  Filled 2018-06-21: qty 20

## 2018-06-21 MED ORDER — HYDROCODONE-ACETAMINOPHEN 5-325 MG PO TABS: 2.0000 | ORAL_TABLET | Freq: Four times a day (QID) | ORAL | 0 refills | Status: DC | PRN

## 2018-06-21 MED ORDER — HYDROCODONE-ACETAMINOPHEN 5-325 MG PO TABS: 1.0000 | ORAL_TABLET | Freq: Four times a day (QID) | ORAL | 0 refills | Status: DC | PRN

## 2018-06-21 MED ORDER — DOXYCYCLINE HYCLATE 100 MG PO TABS
100.0000 mg | ORAL_TABLET | Freq: Once | ORAL | Status: AC
  Administered 2018-06-21: 100 mg via ORAL
  Filled 2018-06-21: qty 1

## 2018-06-21 NOTE — ED Provider Notes (Signed)
[facility] EMERGENCY DEPARTMENT Provider Note   CSN: [id number] Arrival date & time: 06/21/18  1827     History   Chief Complaint Chief Complaint  Patient presents with  . Finger swelling    HPI Gregory Hinton is a 52 y.o. male.  HPI   52 year old male with a significant past medical history of rheumatoid arthritis currently receiving Orencia weekly presents today with swelling to his right middle finger.  Patient notes 6 days ago he developed a small amount of swelling to the distal pad of the right third finger.  He notes this is continued to increase, he denies any significant spread or swelling of the extremity but does note pain at the PIP.  Patient denies any fever or chills, but notes that he has been taking ibuprofen regularly throughout the day as needed for pain.   Past Medical History:  Diagnosis Date  . Anxiety   . Arthritis   . Back pain    had 3 fusions surgeries  . H/O cardiovascular stress test 2014   admitted at hosp. overnight for chest pain, stress/echo test wnl  . Rheumatoid arthritis (HCC)    manages by Dr. Corliss Skains, rec'd Orencia weekly for 2 months, switched from Humira     Patient Active Problem List   Diagnosis Date Noted  . High risk medication use 04/19/2018  . History of diverticulitis 05/04/2017  . History of colon polyps 05/04/2017  . Primary osteoarthritis of both hands 05/04/2017  . Primary osteoarthritis of both feet 05/04/2017  . Primary osteoarthritis of both knees 04/02/2017  . Class 1 obesity without serious comorbidity with body mass index (BMI) of 32.0 to 32.9 in adult 10/14/2016  . Spondylosis of lumbar region without myelopathy or radiculopathy 10/14/2016  . DJD (degenerative joint disease), cervical 10/14/2016  . High risk medications (not anticoagulants) long-term use 10/14/2016  . Rheumatoid arthritis, seronegative, multiple sites (HCC) 10/14/2016  . Lumbar stenosis with neurogenic claudication  12/17/2015    Past Surgical History:  Procedure Laterality Date  . BACK SURGERY  [phone]   lumbar fusion 3 times  . CARPAL TUNNEL RELEASE Right 2000   and left side 2 years ago with left ulnar surgery  . HERNIA REPAIR Right 2001   inguinal        Home Medications    Prior to Admission medications   Medication Sig Start Date End Date Taking? Authorizing Provider  Abatacept (ORENCIA CLICKJECT) 125 MG/ML SOAJ Inject 125 mg into the skin once a week. 06/08/18   Pollyann Savoy, MD  atenolol (TENORMIN) 25 MG tablet Take by mouth. 04/19/18   [provider]  buPROPion (WELLBUTRIN XL) 300 MG 24 hr tablet Take 300 mg by mouth daily.    [provider]  cholecalciferol (VITAMIN D) 1000 units tablet Take 1,000 Units by mouth daily.     [provider]  doxycycline (VIBRAMYCIN) 100 MG capsule Take 1 capsule (100 mg total) by mouth 2 (two) times daily. 06/21/18   Aysen Shieh, Tinnie Gens, PA-C  folic acid (FOLVITE) 1 MG tablet Take 1 mg by mouth daily.     [provider]  HYDROcodone-acetaminophen (NORCO/VICODIN) 5-325 MG tablet Take 1 tablet by mouth every 6 (six) hours as needed. 06/21/18   Elanore Talcott, Tinnie Gens, PA-C  ibuprofen (ADVIL,MOTRIN) 200 MG tablet Take 400 mg by mouth every 8 (eight) hours as needed for mild pain or moderate pain.    [provider]  leflunomide (ARAVA) 20 MG tablet Take 1 tablet (20  mg total) by mouth daily. 05/10/18   Pollyann Savoy, MD    Family History Family History  Problem Relation Age of Onset  . Aneurysm Mother   . Leukemia Father   . Healthy Daughter   . Healthy Son     Social History Social History   Tobacco Use  . Smoking status: Former Smoker    Last attempt to quit: 02/18/1998    Years since quitting: 20.3  . Smokeless tobacco: Never Used  Substance Use Topics  . Alcohol use: Not Currently    Alcohol/week: 4.0 standard drinks    Types: 4 Cans of beer per week    Comment: 4  beers per week  . Drug use: No       Allergies   Eggs or egg-derived products   Review of Systems Review of Systems   Physical Exam Updated Vital Signs BP (!) 147/92 (BP Location: Left Arm)   Pulse 91   Temp 98.6 F (37 C) (Oral)   Resp 16   Ht 6\' 4"  (1.93 m)   Wt 127 kg   SpO2 99%   BMI 34.08 kg/m   Physical Exam  Constitutional: He is oriented to person, place, and time. He appears well-developed and well-nourished.  HENT:  Head: Normocephalic and atraumatic.  Eyes: Pupils are equal, round, and reactive to light. Conjunctivae are normal. Right eye exhibits no discharge. Left eye exhibits no discharge. No scleral icterus.  Neck: Normal range of motion. No JVD present. No tracheal deviation present.  Pulmonary/Chest: Effort normal. No stridor.  Musculoskeletal:  Sensation intact to fingers, no necrosis noted  Neurological: He is alert and oriented to person, place, and time. Coordination normal.  Psychiatric: He has a normal mood and affect. His behavior is normal. Judgment and thought content normal.  Nursing note and vitals reviewed.            ED Treatments / Results  Labs (all labs ordered are listed, but only abnormal results are displayed) Labs Reviewed  CBC WITH DIFFERENTIAL/PLATELET - Abnormal; Notable for the following components:      Result Value   WBC 11.7 (*)    Neutro Abs 8.2 (*)    Abs Immature Granulocytes 0.2 (*)    All other components within normal limits  AEROBIC/ANAEROBIC CULTURE (SURGICAL/DEEP WOUND)  BASIC METABOLIC PANEL    EKG None  Radiology No results found.  Procedures Procedures (including critical care time)  Medications Ordered in ED Medications  lidocaine (XYLOCAINE) 2 % (with pres) injection 400 mg (400 mg Infiltration Given 06/21/18 2029)  bupivacaine (MARCAINE) 0.5 % injection 30 mL (30 mLs Infiltration Given 06/21/18 2029)  doxycycline (VIBRA-TABS) tablet 100 mg (100 mg Oral Given 06/21/18 2140)     Initial Impression / Assessment and  Plan / ED Course  I have reviewed the triage vital signs and the nursing notes.  Pertinent labs & imaging results that were available during my care of the patient were reviewed by me and considered in my medical decision making (see chart for details).      Final Clinical Impressions(s) / ED Diagnoses   Final diagnoses:  Felon   52 year old male presents today with felon.  Patient afebrile no signs of tachycardia.  Minimal elevation in white count.  Patient care discussed with on-call hand surgeon Dr. 40 who performed incision and drainage at bedside.  Patient will be discharged with antibiotics, pain medication, close outpatient follow-up.  He is given strict return precautions, he verbalized understanding and agreement  to today's plan had no further questions or concerns at the time discharge.   ED Discharge Orders         Ordered    doxycycline (VIBRAMYCIN) 100 MG capsule  2 times daily,   Status:  Discontinued     06/21/18 2134    HYDROcodone-acetaminophen (NORCO/VICODIN) 5-325 MG tablet  Every 6 hours PRN,   Status:  Discontinued     06/21/18 2134    HYDROcodone-acetaminophen (NORCO/VICODIN) 5-325 MG tablet  Every 6 hours PRN     06/21/18 2136    doxycycline (VIBRAMYCIN) 100 MG capsule  2 times daily     06/21/18 2136           Eyvonne Mechanic, PA-C 06/22/18 1339    Mesner, Barbara Cower, MD 06/23/18 [phone]

## 2018-06-21 NOTE — ED Notes (Signed)
2nd request to pharmacy to verify medication

## 2018-06-21 NOTE — Discharge Instructions (Signed)
Please read attached information. If you experience any new or worsening signs or symptoms please return to the emergency room for evaluation. Please follow-up with Dr. Amanda Pea tomorrow morning as instructed.  Please use medication prescribed only as directed and discontinue taking if you have any concerning signs or symptoms.

## 2018-06-21 NOTE — ED Notes (Signed)
Pt stable, ambulatory, states understanding of discharge instructions 

## 2018-06-21 NOTE — ED Provider Notes (Signed)
MSE was initiated and I personally evaluated the patient and placed orders (if any) at  7:06 PM on June 21, 2018.  The patient appears stable so that the remainder of the MSE may be completed by another provider.  Patient placed in Quick Look pathway, seen and evaluated   Chief Complaint: R middle finger pain  HPI:   52yo M with pmh of RA currently on biologics, presents to ED for evaluation of 1 week history of nondominant R 3rd digit fingerpad swelling and pain. No history of similar symptoms in past. Unsure if he got a foreign body in there from work. Pain extends to his wrist. Has been taking NSAIDs for pain. No fever.  ROS: finger pain (one)  Physical Exam:   Gen: No distress  Neuro: Awake and Alert  Skin: Warm    Focused Exam: felon noted on R 3rd digit with TTP   Initiation of care has begun. The patient has been counseled on the process, plan, and necessity for staying for the completion/evaluation, and the remainder of the medical screening examination    Dietrich Pates, PA-C 06/21/18 1909    Margarita Grizzle, MD 06/23/18 1026

## 2018-06-21 NOTE — ED Triage Notes (Addendum)
Pt reports rt middle finger infection with swelling and pain that started last week. Finger warm to touch and red. Pain radiates to rt wrist. Denies known injuries.

## 2018-06-22 NOTE — Consult Note (Signed)
NAME: Gregory Hinton, KIRALY MEDICAL RECORD PY:[id number] ACCOUNT [id number] DATE OF BIRTH:October 09, 1965 FACILITY: [facility] LOCATION: [facility] PHYSICIAN:Riham Polyakov M. Amanda Pea, MD  CONSULTATION  DATE OF ADMISSION:  06/21/2018  CONSULTATION REPORT:  I had the pleasure to see the patient in consultation in regards to his right middle finger.  I was asked to see him by the [facility] Emergency Room staff.  INDICATIONS:  The patient is a 52 year old male with progressive history of middle finger swelling and pain without obvious antecedent trauma.  He is on rheumatologic medicines which do have immunosuppressant effects he notes.  He complains of a painful swollen middle finger.  I reviewed his findings at length.  He has a history of spondylosis and lumbar disk disease as well as cervical disk disease.  He has a history of rheumatoid arthritis, diverticulitis, colon polyps and  arthritic changes.  ALLERGIES:  HE NOTES THAT EGG OR OTHER DERIVED EGG PRODUCTS HE IS ALLERGIC TO.    MEDICATIONS:  I have reviewed his medications at great length, which include Orencia, Tenormin, Wellbutrin, vitamin D, doxycycline, folic acid, hydrocodone, ibuprofen and Arava 20 mg daily.  PAST SURGICAL HISTORY:  Reviewed.  SOCIAL HISTORY:  The patient lives with his wife of many years.  He has 3 children, one of which still lives at home.  PHYSICAL EXAMINATION: GENERAL:  Alert and oriented, in no acute distress.  Vital signs stable.  Full range of motion about the elbow, wrist and forearm.  Stable ligamentous examination.  He has a middle finger felon very impressive with pain and swelling.  Opposite  extremities neurovascularly intact.  Lower extremity examination is benign.  Chest is clear.  HEENT is within normal limits.  Abdomen is nontender, nondistended.  IMPRESSION:  Right middle finger felon.  PLAN:  I have discussed all issues with the patient at length.  We have consented him for irrigation and debridement and  repair as necessary.  DESCRIPTION OF PROCEDURE:  The patient seen by myself and underwent anesthesia in the form of a field/flexor sheath block.  He was prepped and draped with 2 separate Hibiclens followed by Betadine scrub.  Once this was complete, we then made outline  marks, called a timeout and performed irrigation and debridement and evacuation of the middle finger felon.  I made a midline incision, dissected down.  A large amount of inflammation and devitalized skin tissue was removed without difficulty, which the  patient tolerated well.  There were no complicating features.  Following this, we irrigated with a liter of saline followed by packing the wound.  We will call him back tomorrow for whirlpool and begin outpatient whirlpool treatments in my office.  I  discussed with him all issues do's and don'ts and other aspects of his care.  He was discharged home on doxycycline 1 p.o. b.i.d. x14 days with a refill and hydrocodone p.r.n. pain.  I will see him back tomorrow.  We discussed all issues.  He tolerated the treatment algorithm today quite nicely.  Should any problems occur, I will  be immediately available.  TN/NUANCE D:06/21/2018 T:06/22/2018 JOB:[id number]

## 2018-06-27 ENCOUNTER — Other Ambulatory Visit: Payer: Self-pay | Admitting: Rheumatology

## 2018-06-27 LAB — AEROBIC/ANAEROBIC CULTURE W GRAM STAIN (SURGICAL/DEEP WOUND)

## 2018-06-27 LAB — AEROBIC/ANAEROBIC CULTURE (SURGICAL/DEEP WOUND)

## 2018-06-27 NOTE — Telephone Encounter (Signed)
Last Visit: 04/19/18 Next Visit: 09/20/18 Labs: 06/10/18 Glucose mildly low. Potassium borderline low. Alk phos mildly low. WBC elevated TB Gold: 06/10/18 Neg   Okay to refill per Dr. Estanislado Pandy

## 2018-06-28 ENCOUNTER — Telehealth (HOSPITAL_COMMUNITY): Payer: Self-pay | Admitting: Pharmacist

## 2018-06-28 ENCOUNTER — Telehealth: Payer: Self-pay | Admitting: Emergency Medicine

## 2018-06-28 NOTE — Progress Notes (Signed)
ED Antimicrobial Stewardship Positive Culture Follow Up   CHAUN UEMURA is an 52 y.o. male who presented to [facility] on (Not on file) with a chief complaint of No chief complaint on file.   Recent Results (from the past 720 hour(s))  Aerobic/Anaerobic Culture (surgical/deep wound)     Status: None   Collection Time: 06/21/18  9:36 PM  Result Value Ref Range Status   Specimen Description FINGER RIGHT ABCESS  Final   Special Requests Immunocompromised  Final   Gram Stain   Final    ABUNDANT WBC PRESENT, PREDOMINANTLY PMN MODERATE GRAM POSITIVE COCCI    Culture   Final    FEW STAPHYLOCOCCUS AUREUS NO ANAEROBES ISOLATED Performed at [facility], [address], [place], [place] [address]    Report Status 06/27/2018 FINAL  Final   Organism ID, Bacteria STAPHYLOCOCCUS AUREUS  Final      Susceptibility   Staphylococcus aureus - MIC*    CIPROFLOXACIN <=0.5 SENSITIVE Sensitive     ERYTHROMYCIN <=0.25 SENSITIVE Sensitive     GENTAMICIN <=0.5 SENSITIVE Sensitive     OXACILLIN 0.5 SENSITIVE Sensitive     TETRACYCLINE >=16 RESISTANT Resistant     VANCOMYCIN 1 SENSITIVE Sensitive     TRIMETH/SULFA <=10 SENSITIVE Sensitive     CLINDAMYCIN <=0.25 SENSITIVE Sensitive     RIFAMPIN <=0.5 SENSITIVE Sensitive     Inducible Clindamycin NEGATIVE Sensitive     * FEW STAPHYLOCOCCUS AUREUS    [x]  Treated with doxycycline , organism resistant to prescribed antimicrobial []  Patient discharged originally without antimicrobial agent and treatment is now indicated  New antibiotic prescription: Bactrim DS 1 tab po BID x 7 days  ED Provider: B.   06/28/2018, 10:28 AM Clinical Pharmacist Monday - Friday phone -  [phone] Saturday - Sunday phone - [phone]

## 2018-06-28 NOTE — Telephone Encounter (Signed)
Post ED Visit - Positive Culture Follow-up: Successful Patient Follow-Up  Culture assessed and recommendations reviewed by:  []  , Pharm.D. []  Enzo Bi, Pharm.D., BCPS AQ-ID []  , Pharm.D., BCPS []  Celedonio Miyamoto, Pharm.D., BCPS []  [place], Garvin Fila.D., BCPS, AAHIVP []  , Pharm.D., BCPS, AAHIVP []  Georgina Pillion, PharmD, BCPS []  , PharmD, BCPS [x]  [place], PharmD, BCPS []  [place], PharmD  Positive aerobic culture of right middle finger  []  Patient discharged without antimicrobial prescription and treatment is now indicated [x]  Organism is resistant to prescribed ED discharge antimicrobial []  Patient with positive blood cultures  Changes discussed with ED provider: , PA New antibiotic prescription Bactrim DS PO BID x seven days. Called to: Estella Husk, [place] [phone]  Contacted patient, date 06/28/2018, time 1200    06/28/2018, 12:09 PM

## 2018-07-26 NOTE — Pre-Procedure Instructions (Signed)
AMARIUS TOTO  07/26/2018      [facility] - [place], [place] - [address] [place] [address] Phone: [phone] Fax: [phone]  [facility] - [place], [place] - [address] [place] [address] Phone: [phone] Fax: [phone]  [facility] - [place], [place] - [address] [place] [address] Phone: [phone] Fax: [phone]    Your procedure is scheduled on Monday November 4th.  Report to [facility] Admitting at 5:30 A.M.  Call this number if you have problems the morning of surgery:  [phone]   Remember:  Do not eat or drink after midnight.   Take these medicines the morning of surgery with A SIP OF WATER  buPROPion (WELLBUTRIN XL) 300 MG 24 hr tablet propranolol (INDERAL) 10 MG tablet- if needed    Do not wear jewelry  Do not wear lotions, powders, or colognes, or deodorant.  Do not shave 48 hours prior to surgery.  Men may shave face and neck.  Do not bring valuables to the hospital.  [facility] is not responsible for any belongings or valuables.  Contacts, eyeglasses, hearing aids, dentures or bridgework may not be worn into surgery.  Leave your suitcase in the car.  After surgery it may be brought to your room.  For patients admitted to the hospital, discharge time will be determined by your treatment team.  Patients discharged the day of surgery will not be allowed to drive home.   [place]- Preparing For Surgery  Before surgery, you can play an important role. Because skin is not sterile, your skin needs to be as free of germs as possible. You can reduce the number of germs on your skin by washing with CHG (chlorahexidine gluconate) Soap before surgery.  CHG is an antiseptic cleaner which kills germs and bonds with the skin to continue killing germs even after washing.    Oral  Hygiene is also important to reduce your risk of infection.  Remember - BRUSH YOUR TEETH THE MORNING OF SURGERY WITH YOUR REGULAR TOOTHPASTE  Please do not use if you have an allergy to CHG or antibacterial soaps. If your skin becomes reddened/irritated stop using the CHG.  Do not shave (including legs and underarms) for at least 48 hours prior to first CHG shower. It is OK to shave your face.  Please follow these instructions carefully.   1. Shower the NIGHT BEFORE SURGERY and the MORNING OF SURGERY with CHG.   2. If you chose to wash your hair, wash your hair first as usual with your normal shampoo.  3. After you shampoo, rinse your hair and body thoroughly to remove the shampoo.  4. Use CHG as you would any other liquid soap. You can apply CHG directly to the skin and wash gently with a scrungie or a clean washcloth.   5. Apply the CHG Soap to your body ONLY FROM THE NECK DOWN.  Do not use on open wounds or open sores. Avoid contact with your eyes, ears, mouth and genitals (private parts). Wash Face and genitals (private parts)  with your normal soap.  6. Wash thoroughly, paying special attention to the area where your surgery will be performed.  7. Thoroughly rinse your body with warm water from the neck down.  8. DO NOT shower/wash with your normal soap after using and rinsing off the CHG Soap.  9. Pat yourself dry with a CLEAN TOWEL.  10. Wear CLEAN PAJAMAS to bed the night before surgery, wear comfortable clothes the morning of surgery  11. Place CLEAN SHEETS on your bed the night of your first shower and DO NOT SLEEP WITH PETS.    Day of Surgery: Shower as stated above. Do not apply any deodorants/lotions.  Please wear clean clothes to the hospital/surgery center.   Remember to brush your teeth WITH YOUR REGULAR TOOTHPASTE.      Please read over the following fact sheets that you were given.

## 2018-07-27 ENCOUNTER — Encounter (HOSPITAL_COMMUNITY)
Admission: RE | Admit: 2018-07-27 | Discharge: 2018-07-27 | Disposition: A | Discharge status: Home / Self Care | Payer: BLUE CROSS/BLUE SHIELD | Source: Ambulatory Visit | Attending: Neurological Surgery | Admitting: Neurological Surgery

## 2018-07-27 ENCOUNTER — Encounter (HOSPITAL_COMMUNITY): Payer: Self-pay

## 2018-07-27 ENCOUNTER — Other Ambulatory Visit: Payer: Self-pay

## 2018-07-27 DIAGNOSIS — Z01818 Encounter for other preprocedural examination: Secondary | ICD-10-CM | POA: Insufficient documentation

## 2018-07-27 DIAGNOSIS — M48062 Spinal stenosis, lumbar region with neurogenic claudication: Secondary | ICD-10-CM | POA: Insufficient documentation

## 2018-07-27 HISTORY — DX: Unspecified adrenocortical insufficiency: E27.40

## 2018-07-27 HISTORY — DX: Essential (primary) hypertension: I10

## 2018-07-27 LAB — CBC
HCT: 42 % (ref 39.0–52.0)
Hemoglobin: 13.1 g/dL (ref 13.0–17.0)
MCH: 28.2 pg (ref 26.0–34.0)
MCHC: 31.2 g/dL (ref 30.0–36.0)
MCV: 90.3 fL (ref 80.0–100.0)
PLATELETS: 226 10*3/uL (ref 150–400)
RBC: 4.65 MIL/uL (ref 4.22–5.81)
RDW: 15.3 % (ref 11.5–15.5)
WBC: 9.3 10*3/uL (ref 4.0–10.5)
nRBC: 0 % (ref 0.0–0.2)

## 2018-07-27 LAB — BASIC METABOLIC PANEL
Anion gap: 11 (ref 5–15)
BUN: 16 mg/dL (ref 6–20)
CALCIUM: 9.4 mg/dL (ref 8.9–10.3)
CO2: 25 mmol/L (ref 22–32)
CREATININE: 0.97 mg/dL (ref 0.61–1.24)
Chloride: 102 mmol/L (ref 98–111)
GFR calc Af Amer: >60 mL/min (ref >60)
GLUCOSE: 103 mg/dL — AB (ref 70–99)
Potassium: 3.7 mmol/L (ref 3.5–5.1)
SODIUM: 138 mmol/L (ref 135–145)

## 2018-07-27 LAB — TYPE AND SCREEN
ABO/RH(D): A POS
Antibody Screen: NEGATIVE

## 2018-07-27 LAB — SURGICAL PCR SCREEN
MRSA, PCR: NEGATIVE
STAPHYLOCOCCUS AUREUS: POSITIVE — AB

## 2018-07-27 NOTE — Progress Notes (Signed)
PCP - no in system  Chest x-ray - 03/2018 [facility] EKG - 06/2018 requested Stress Test - 2014 Cardiac Cath - 2018 [facility] requested  Blood Thinner Instructions: N/A Aspirin Instructions: N/A  Anesthesia review: yes, requested docs  Patient denies shortness of breath, fever, cough and chest pain at PAT appointment   Patient verbalized understanding of instructions that were given to them at the PAT appointment. Patient was also instructed that they will need to review over the PAT instructions again at home before surgery.

## 2018-07-28 ENCOUNTER — Encounter (HOSPITAL_COMMUNITY): Payer: Self-pay | Admitting: Vascular Surgery

## 2018-07-28 NOTE — Progress Notes (Signed)
Anesthesia Chart Review:  Case:  [id number] Date/Time:  08/08/18 0715   Procedure:  Lumbar 2-3 Anterolateral lumbar decompression/interbody fusion with lateral fixation (N/A ) - Lumbar 2-3 Anterolateral lumbar decompression/interbody fusion with lateral fixation   Anesthesia type:  General   Pre-op diagnosis:  Spinal stenosis, Lumbar region with neurogenic claudication   Location:  [facility] OR ROOM 18 / [facility] OR   Surgeon:  Barnett Abu, MD      DISCUSSION: Patient is a 52 year old male scheduled for the above procedure. History includes former smoker (quit '99), HTN, RA, adrenal insufficiency (likely due to prior ong term use of prednisone due to RA). He had normal coronaries by 2018 cardiac cath. BMI is consistent with obesity. He had a right middle finger felon 06/21/18 that has now resolved after antibiotic therapy. Because of this and upcoming surgery, his RA meds have been on hold for ~ 1 month.  Patient says his ACTH levels have slowly been improving, but his with upcoming surgery, his endocrinologist Dr. Allena Katz recently started him on Hydrocortisone 10 mg 1 tablet Q AM and 1/2 tablet Q PM. He is aware to take this usual dose on the morning of surgery.   Based on currently available information, I would anticipate that he can proceed as planned if no acute changes.    VS: BP 137/88   Temp 36.8 C   Resp 20   Ht 6\' 4"  (1.93 m)   Wt 131.5 kg   SpO2 99%   BMI 35.29 kg/m     PROVIDERS: [place], , PA-C is PCP [facility]; [facility] Everywhere). [facility], MD is rheumatologist Pollyann Savoy, MD is endocrinologist [facility] Care Everywhere). Last visit 07/19/18. ACTH and Cortisol levels repeated. Cortef started in anticipation for upcoming surgery. Notes indicate that a sleep study has been recommended but not done yet.   LABS: Labs reviewed: Acceptable for surgery. Adrenocorticotropic hormone 55 [7.2-63] and Cortisol 5.6 [2.0-10.0 UG/DL evening normal] on  04[birth year]03[birth year]1972 at 3:53 PM.  (all labs ordered are listed, but only abnormal results are displayed)  Labs Reviewed  SURGICAL PCR SCREEN - Abnormal; Notable for the following components:      Result Value   Staphylococcus aureus POSITIVE (*)    All other components within normal limits  BASIC METABOLIC PANEL - Abnormal; Notable for the following components:   Glucose, Bld 103 (*)    All other components within normal limits  CBC  TYPE AND SCREEN    IMAGES: CTA Chest [facility]; report in PACS): IMPRESSION: No definite evidence of pulmonary embolus.  No acute abnormality seen in the chest.   EKG: 06/14/18 ([facility]): ST at 101 bpm.   CV: Cardiac cath 03/26/17 [facility] Care Everywhere): 1. Normal coronary arteriogram. 2. Left-dominant circulation. 3. Normal LV systolic function, EF 55-60% Diagnostic Procedure Recommendations 1. No cardiac explanation for this patient's symptoms was found. 2. Continue outpatient work up for non-cardiac explanations for chest pain and exertional dyspnea.   Past Medical History:  Diagnosis Date  . Adrenal insufficiency (HCC)   . Anxiety   . Arthritis   . Back pain    had 3 fusions surgeries  . H/O cardiovascular stress test 2014   admitted at hosp. overnight for chest pain, stress/echo test wnl  . Hypertension   . Rheumatoid arthritis (HCC)    manages by Dr. 2015, rec'd Orencia weekly for 2 months, switched from Humira     Past Surgical History:  Procedure Laterality Date  . BACK SURGERY  [phone]   lumbar fusion 3 times  . CARPAL TUNNEL RELEASE Right 2000   and left side 2 years ago with left ulnar surgery  . COLONOSCOPY    . HERNIA REPAIR Right 2001   inguinal     MEDICATIONS: . hydrocortisone (CORTEF) 10 MG tablet  . buPROPion (WELLBUTRIN XL) 300 MG 24 hr tablet  . cholecalciferol (VITAMIN D) 1000 units tablet  . doxycycline (VIBRAMYCIN) 100 MG capsule  . folic acid (FOLVITE) 1 MG tablet  .  HYDROcodone-acetaminophen (NORCO/VICODIN) 5-325 MG tablet  . ibuprofen (ADVIL,MOTRIN) 200 MG tablet  . leflunomide (ARAVA) 20 MG tablet  . olmesartan (BENICAR) 20 MG tablet  . ORENCIA CLICKJECT 125 MG/ML SOAJ  . propranolol (INDERAL) 10 MG tablet   . 0.9 %  sodium chloride infusion  RA medications are on hold for ~ 1 month prior to surgery. He takes Inderal 10 mg Q day PRN BP > 155/100 or HR > 110. On 07/21/18, endocrinology prescribed Hydrocortisone 10 mg 1 tablet Q AM and 1/2 tablet Q PM.   Shonna Chock, PA-C [facility]/Anesthesiology Phone [phone] 07/28/2018 4:13 PM

## 2018-07-28 NOTE — Anesthesia Preprocedure Evaluation (Deleted)
Anesthesia Evaluation    Airway        Dental   Pulmonary former smoker,           Cardiovascular hypertension,      Neuro/Psych    GI/Hepatic   Endo/Other  Adrenal insufficiency (on Cortef)  Renal/GU      Musculoskeletal RA   Abdominal   Peds  Hematology   Anesthesia Other Findings   Reproductive/Obstetrics                             Anesthesia Physical Anesthesia Plan  ASA:   Anesthesia Plan:    Post-op Pain Management:    Induction:   PONV Risk Score and Plan:   Airway Management Planned:   Additional Equipment:   Intra-op Plan:   Post-operative Plan:   Informed Consent:   Plan Discussed with:   Anesthesia Plan Comments: (See PAT note written 07/28/2018 by Shonna Chock, PA-C. )        Anesthesia Quick Evaluation

## 2018-08-05 MED ORDER — DEXTROSE 5 % IV SOLN
3.0000 g | INTRAVENOUS | Status: DC
  Filled 2018-08-05: qty 3000

## 2018-08-08 ENCOUNTER — Encounter (HOSPITAL_COMMUNITY): Admission: RE | Payer: Self-pay | Source: Ambulatory Visit

## 2018-08-08 ENCOUNTER — Inpatient Hospital Stay (HOSPITAL_COMMUNITY)
Admission: RE | Admit: 2018-08-08 | Payer: BLUE CROSS/BLUE SHIELD | Source: Ambulatory Visit | Admitting: Neurological Surgery

## 2018-08-08 SURGERY — ANTERIOR LATERAL LUMBAR FUSION 1 LEVEL
Anesthesia: General

## 2018-09-06 NOTE — Progress Notes (Signed)
Office Visit Note  Patient: Gregory Hinton             Date of Birth: [birth year]           MRN: [id number]             PCP: System, Pcp Not In Referring: Irena Reichmann, DO Visit Date: 09/20/2018 Occupation: @GUAROCC @  Subjective:  Pain in multiple joints    History of Present Illness: Gregory Hinton is a 52 y.o. male with history of seronegative rheumatoid arthritis, osteoarthritis, and DDD.   Patient's last dose of Arava and Orencia was the first week of October due to holding these medications prior to back surgery by Dr. Danielle Dess.  His surgery ended up getting postponed due to cardiac concerns.  He was tachycardic and having significant peripheral edema.  Several of his medications were changed which have resolved his symptoms.  He continues to take Lasix.  He was switched from hydrocortisone to prednisone for adrenal insufficiency which is also improved symptoms.  He is currently on prednisone 5 mg by mouth daily.  Despite being on prednisone he continues to have pain in multiple joints including bilateral hands, bilateral wrists, bilateral shoulders, bilateral knees, bilateral ankles.  He states that he is noticed swelling in bilateral hands and his left knee joint.  He has been having considerable joint stiffness that has been lasting several hours every morning.  He is also been having difficulty performing responsibilities at work due to the pain in both hands.  He has been alternating Tylenol and Advil for pain relief.  He would like to restart on his medications since his surgery has not been rescheduled yet.  Activities of Daily Living:  Patient reports morning stiffness for 3-4  hours.   Patient Reports nocturnal pain.  Difficulty dressing/grooming: Denies Difficulty climbing stairs: Reports Difficulty getting out of chair: Reports Difficulty using hands for taps, buttons, cutlery, and/or writing: Reports  Review of Systems  Constitutional: Positive for fatigue.  Negative for night sweats.  HENT: Negative for mouth sores, mouth dryness and nose dryness.   Eyes: Positive for dryness. Negative for redness and visual disturbance.  Respiratory: Negative for cough, hemoptysis, shortness of breath and difficulty breathing.   Cardiovascular: Positive for swelling in legs/feet. Negative for chest pain, palpitations, hypertension and irregular heartbeat.  Gastrointestinal: Negative for blood in stool, constipation and diarrhea.  Endocrine: Negative for increased urination.  Genitourinary: Negative for painful urination.  Musculoskeletal: Positive for arthralgias, joint pain, joint swelling and morning stiffness. Negative for myalgias, muscle weakness, muscle tenderness and myalgias.  Skin: Negative for color change, rash, hair loss, nodules/bumps, skin tightness, ulcers and sensitivity to sunlight.  Allergic/Immunologic: Negative for susceptible to infections.  Neurological: Negative for dizziness, fainting, memory loss, night sweats and weakness.  Hematological: Negative for swollen glands.  Psychiatric/Behavioral: Negative for depressed mood and sleep disturbance. The patient is not nervous/anxious.     PMFS History:  Patient Active Problem List   Diagnosis Date Noted  . High risk medication use 04/19/2018  . History of diverticulitis 05/04/2017  . History of colon polyps 05/04/2017  . Primary osteoarthritis of both hands 05/04/2017  . Primary osteoarthritis of both feet 05/04/2017  . Primary osteoarthritis of both knees 04/02/2017  . Class 1 obesity without serious comorbidity with body mass index (BMI) of 32.0 to 32.9 in adult 10/14/2016  . Spondylosis of lumbar region without myelopathy or radiculopathy 10/14/2016  . DJD (degenerative joint disease), cervical 10/14/2016  . High risk  medications (not anticoagulants) long-term use 10/14/2016  . Rheumatoid arthritis, seronegative, multiple sites (HCC) 10/14/2016  . Lumbar stenosis with neurogenic  claudication 12/17/2015    Past Medical History:  Diagnosis Date  . Adrenal insufficiency (HCC)   . Anxiety   . Arthritis   . Back pain    had 3 fusions surgeries  . H/O cardiovascular stress test 2014   admitted at hosp. overnight for chest pain, stress/echo test wnl  . Hypertension   . Rheumatoid arthritis (HCC)    manages by Dr. Corliss Skains, rec'd Orencia weekly for 2 months, switched from Humira     Family History  Problem Relation Age of Onset  . Aneurysm Mother   . Leukemia Father   . Healthy Daughter   . Healthy Son    Past Surgical History:  Procedure Laterality Date  . BACK SURGERY  [phone]   lumbar fusion 3 times  . CARPAL TUNNEL RELEASE Right 2000   and left side 2 years ago with left ulnar surgery  . COLONOSCOPY    . HERNIA REPAIR Right 2001   inguinal    Social History   Social History Narrative  . Not on file   Immunization History  Administered Date(s) Administered  . Influenza-Unspecified 05/05/2014  . Pneumococcal Polysaccharide-23 04/25/2014  . Zoster 04/25/2014   Objective: Vital Signs: BP 121/78 (BP Location: Left Arm, Patient Position: Sitting, Cuff Size: Normal)   Pulse 81   Resp 16   Ht 6\' 4"  (1.93 m)   Wt 288 lb (130.6 kg)   BMI 35.06 kg/m    Physical Exam Vitals signs and nursing note reviewed.  Constitutional:      Appearance: He is well-developed.  HENT:     Head: Normocephalic and atraumatic.  Eyes:     Conjunctiva/sclera: Conjunctivae normal.     Pupils: Pupils are equal, round, and reactive to light.  Neck:     Musculoskeletal: Normal range of motion and neck supple.  Cardiovascular:     Rate and Rhythm: Normal rate and regular rhythm.     Heart sounds: Normal heart sounds.  Pulmonary:     Effort: Pulmonary effort is normal.     Breath sounds: Normal breath sounds.  Abdominal:     General: Bowel sounds are normal.     Palpations: Abdomen is soft.  Lymphadenopathy:     Cervical: No cervical adenopathy.  Skin:     General: Skin is warm and dry.     Capillary Refill: Capillary refill takes less than 2 seconds.  Neurological:     Mental Status: He is alert and oriented to person, place, and time.  Psychiatric:        Behavior: Behavior normal.      Musculoskeletal Exam: C-spine, thoracic spine and lumbar spine limited range of motion. Shoulder joint abduction to 90 degrees with discomfort.  Elbow joints good ROM with no tenderness or synovitis.  Wrist joints limited ROM with discomfort. Tenderness of MCPs but no synovitis noted.  Hip joints, knee joints, ankle joints, MTPs, PIPs, and DIPs good ROM with no synovitis.  No warmth or effusion of knee joints.  Tenderness of both ankles and left knee joint.    CDAI Exam: CDAI Score: 14.2  Patient Global Assessment: 6 (mm); Provider Global Assessment: 6 (mm) Swollen: 0 ; Tender: 15  Joint Exam      Right  Left  Glenohumeral   Tender   Tender  Wrist   Tender     MCP 1  Tender   Tender  MCP 2   Tender   Tender  MCP 3   Tender   Tender  MCP 4   Tender     MCP 5   Tender   Tender  Knee      Tender  Ankle   Tender   Tender     Investigation: No additional findings.  Imaging: No results found.  Recent Labs: Lab Results  Component Value Date   WBC 9.3 07/27/2018   HGB 13.1 07/27/2018   PLT 226 07/27/2018   NA 138 07/27/2018   K 3.7 07/27/2018   CL 102 07/27/2018   CO2 25 07/27/2018   GLUCOSE 103 (H) 07/27/2018   BUN 16 07/27/2018   CREATININE 0.97 07/27/2018   BILITOT 0.5 06/10/2018   ALKPHOS 28 (L) 11/24/2017   AST 20 06/10/2018   ALT 43 06/10/2018   PROT 6.3 06/10/2018   ALBUMIN 3.0 (L) 11/24/2017   CALCIUM 9.4 07/27/2018   GFRAA >60 07/27/2018   QFTBGOLDPLUS NEGATIVE 06/10/2018    Speciality Comments: No specialty comments available.  Procedures:  No procedures performed Allergies: Eggs or egg-derived products       Assessment / Plan:     Visit Diagnoses: Rheumatoid arthritis, seronegative, multiple sites [facility]): He  has no active synovitis on exam, but he has tenderness of multiple joints as described above.  He has been holding [place] since the first week of October 2019 due to planning on have spine surgery by Dr. Danielle Dess in November.  The surgery was postponed due to the patient experiencing episodic tachycardia and peripheral edema.  Several medications were changed and his symptoms have resolved.  His surgery has not been rescheduled at this time, so he would like to restart on [place].  He was advised to restart but to hold Orencia and Arava prior to surgery.  According to the patient, Dr. Danielle Dess advised him to hold these medications 3 weeks prior.  He will continue taking Prednisone 5 mg by mouth daily for management of adrenal insufficiency.  He was advised to notify us if he continues to have increased joint pain and intermittent joint swelling despite restarting on these medications.  He will follow up in 5 months.   High risk medication use - Orencia sq and Arava 20 mg po dailyfailed MTX, PLQ, Enbrel, and Humira in the past.  Due to his history of diverticulitis, Harriette Ohara is not an option.  Last TB: Negative on 06/10/2018.  Most recent CBC/BMP from hospital encounter within normal limits on 07/27/2018.  Next CBC/CMP due in January and then every 3 months.  Standing orders are in place.   Primary osteoarthritis of both hands: He has PIP and DIP synovial thickening consistent with osteoarthritis of bilateral hands.  He has no synovitis on exam.  He has complete fist formation bilaterally.  He has been having increased pain in bilateral hands sounds holding the dose of Orencia and Arava.  Joint protection and muscle strengthening were discussed.  Primary osteoarthritis of both knees: No warmth or effusion.  He has good range of motion.  He has tenderness of the left knee joint.  Primary osteoarthritis of both feet: He has been having increased discomfort in bilateral feet.  He wears proper  fitting shoes.  DDD (degenerative disc disease), cervical: He has limited range of motion.  He has no symptoms of radiculopathy at this time.  DDD (degenerative disc disease), lumbar: Chronic pain.  He has not rescheduled his  spine surgery with Dr. Danielle Dess yet.  Spondylosis of lumbar region without myelopathy or radiculopathy: Chronic pain  Other medical conditions are listed as follows  History of diverticulitis  History of colon polyps   Orders: No orders of the defined types were placed in this encounter.  No orders of the defined types were placed in this encounter.   Face-to-face time spent with patient was 30 minutes. Greater than 50% of time was spent in counseling and coordination of care.  Follow-Up Instructions: Return in about 5 months (around 02/19/2019) for Rheumatoid arthritis, Osteoarthritis.   Gearldine Bienenstock, PA-C  Note - This record has been created using Dragon software.  Chart creation errors have been sought, but may not always  have been located. Such creation errors do not reflect on  the standard of medical care.

## 2018-09-20 ENCOUNTER — Ambulatory Visit (INDEPENDENT_AMBULATORY_CARE_PROVIDER_SITE_OTHER): Payer: BLUE CROSS/BLUE SHIELD | Admitting: Physician Assistant

## 2018-09-20 ENCOUNTER — Encounter: Payer: Self-pay | Admitting: Physician Assistant

## 2018-09-20 VITALS — BP 121/78 | HR 81 | Resp 16 | Ht 76.0 in | Wt 288.0 lb

## 2018-09-20 DIAGNOSIS — Z86010 Personal history of colonic polyps: Secondary | ICD-10-CM

## 2018-09-20 DIAGNOSIS — M170 Bilateral primary osteoarthritis of knee: Secondary | ICD-10-CM

## 2018-09-20 DIAGNOSIS — Z79899 Other long term (current) drug therapy: Secondary | ICD-10-CM

## 2018-09-20 DIAGNOSIS — M5030 Other cervical disc degeneration, unspecified cervical region: Secondary | ICD-10-CM

## 2018-09-20 DIAGNOSIS — M19042 Primary osteoarthritis, left hand: Secondary | ICD-10-CM

## 2018-09-20 DIAGNOSIS — M19072 Primary osteoarthritis, left ankle and foot: Secondary | ICD-10-CM

## 2018-09-20 DIAGNOSIS — M19041 Primary osteoarthritis, right hand: Secondary | ICD-10-CM | POA: Diagnosis not present

## 2018-09-20 DIAGNOSIS — M0609 Rheumatoid arthritis without rheumatoid factor, multiple sites: Secondary | ICD-10-CM | POA: Diagnosis not present

## 2018-09-20 DIAGNOSIS — M5136 Other intervertebral disc degeneration, lumbar region: Secondary | ICD-10-CM

## 2018-09-20 DIAGNOSIS — M47816 Spondylosis without myelopathy or radiculopathy, lumbar region: Secondary | ICD-10-CM

## 2018-09-20 DIAGNOSIS — Z8719 Personal history of other diseases of the digestive system: Secondary | ICD-10-CM

## 2018-09-20 DIAGNOSIS — M19071 Primary osteoarthritis, right ankle and foot: Secondary | ICD-10-CM

## 2018-09-20 NOTE — Patient Instructions (Signed)
Standing Labs We placed an order today for your standing lab work.    Please come back and get your standing labs 1 month after restarting on Orencia and Arava and then every 3 months   We have open lab Monday through Friday from 8:30-11:30 AM and 1:30-4:00 PM  at the office of Dr. Pollyann Savoy.   You may experience shorter wait times on Monday and Friday afternoons. The office is located at [address], [address], [place], [place] [address] No appointment is necessary.   Labs are drawn by [employer].  You may receive a bill from [facility] for your lab work. If you have any questions regarding directions or hours of operation,  please call [phone].   Just as a reminder please drink plenty of water prior to coming for your lab work. Thanks!

## 2018-10-31 ENCOUNTER — Other Ambulatory Visit: Payer: Self-pay | Admitting: Rheumatology

## 2018-10-31 NOTE — Telephone Encounter (Signed)
Received a fax from [facility] regarding a prior authorization for [facility]. Authorization has been APPROVED from 10/28/2018 to 10/26/2021.   Will send document to scan center.  Authorization # [id number] Phone # [phone]

## 2018-10-31 NOTE — Telephone Encounter (Signed)
Patient left voicemail requesting a return call regarding his prescription of Orencia.

## 2018-10-31 NOTE — Telephone Encounter (Signed)
Patient states he is needing a prescription refill on his Orencia. Patient advised we have not received a refill request from the pharmacy and I appreciate him reaching out to let me know. Patient advised he is due for labs and we will have samples in the office approximately Wednesday and that he may come to update labs and pick up a sample. Patient is in agreement. Will send in prescription when labs are updated.

## 2018-11-04 ENCOUNTER — Telehealth: Payer: Self-pay | Admitting: Pharmacist

## 2018-11-04 NOTE — Telephone Encounter (Signed)
Received a fax from [facility] regarding a prior authorization for [facility]. Authorization has been APPROVED from 10/28/2018 to 10/26/2021.   Will send document to scan center.  Authorization # [id number] Phone # [phone]

## 2018-12-13 ENCOUNTER — Other Ambulatory Visit: Payer: Self-pay | Admitting: Neurological Surgery

## 2018-12-19 NOTE — Pre-Procedure Instructions (Signed)
Gregory Hinton  12/19/2018      [facility] - [place], [place] - [address] [place] [address] Phone: [phone] Fax: [phone]  [facility] - [place], [place] - [name] [address] Phone: [phone] Fax: [phone]  [facility] - [place], [place] - [address] [place] [address] Phone: [phone] Fax: [phone]    Your procedure is scheduled on March 23rd, 2020.  Report to [facility] Admitting at 12:00 P.M.  Call this number if you have problems the morning of surgery:  [phone]   Remember:  Do not eat or drink after midnight. This includes gum and hard candy.    Take these medicines the morning of surgery with A SIP OF WATER: Propanolol and Tramadol if needed.    Do not wear jewelry, make-up or nail polish.  Do not wear lotions, powders, or perfumes, or deodorant.  Do not shave 48 hours prior to surgery.  Men may shave face and neck.  Do not bring valuables to the hospital.  [facility] is not responsible for any belongings or valuables.  Contacts, dentures or bridgework may not be worn into surgery.  Leave your suitcase in the car.  After surgery it may be brought to your room.  For patients admitted to the hospital, discharge time will be determined by your treatment team.  Patients discharged the day of surgery will not be allowed to drive home.    [place]- Preparing For Surgery  Before surgery, you can play an important role. Because skin is not sterile, your skin needs to be as free of germs as possible. You can reduce the number of germs on your skin by washing with CHG (chlorahexidine gluconate) Soap before surgery.  CHG is an antiseptic cleaner which kills germs and bonds with the skin to continue killing germs even after washing.    Oral Hygiene is also  important to reduce your risk of infection.  Remember - BRUSH YOUR TEETH THE MORNING OF SURGERY WITH YOUR REGULAR TOOTHPASTE  Please do not use if you have an allergy to CHG or antibacterial soaps. If your skin becomes reddened/irritated stop using the CHG.  Do not shave (including legs and underarms) for at least 48 hours prior to first CHG shower. It is OK to shave your face.  Please follow these instructions carefully.   1. Shower the NIGHT BEFORE SURGERY and the MORNING OF SURGERY with CHG.   2. If you chose to wash your hair, wash your hair first as usual with your normal shampoo.  3. After you shampoo, rinse your hair and body thoroughly to remove the shampoo.  4. Use CHG as you would any other liquid soap. You can apply CHG directly to the skin and wash gently with a scrungie or a clean washcloth.   5. Apply the CHG Soap to your body ONLY FROM THE NECK DOWN.  Do not use on open wounds or open sores. Avoid contact with your eyes, ears, mouth and genitals (private parts). Wash Face and genitals (private parts)  with your normal soap.  6. Wash thoroughly, paying special attention to the area where your surgery will be performed.  7. Thoroughly rinse your body with warm water from the neck down.  8. DO NOT shower/wash with your normal soap after using and rinsing off the CHG Soap.  9.  Pat yourself dry with a CLEAN TOWEL.  10. Wear CLEAN PAJAMAS to bed the night before surgery, wear comfortable clothes the morning of surgery  11. Place CLEAN SHEETS on your bed the night of your first shower and DO NOT SLEEP WITH PETS.    Day of Surgery:  Do not apply any deodorants/lotions.  Please wear clean clothes to the hospital/surgery center.   Remember to brush your teeth WITH YOUR REGULAR TOOTHPASTE.

## 2018-12-20 ENCOUNTER — Encounter (HOSPITAL_COMMUNITY): Payer: Self-pay

## 2018-12-20 ENCOUNTER — Other Ambulatory Visit: Payer: Self-pay

## 2018-12-20 ENCOUNTER — Encounter (HOSPITAL_COMMUNITY)
Admission: RE | Admit: 2018-12-20 | Discharge: 2018-12-20 | Disposition: A | Discharge status: Home / Self Care | Payer: BLUE CROSS/BLUE SHIELD | Source: Ambulatory Visit | Attending: Neurological Surgery | Admitting: Neurological Surgery

## 2018-12-20 DIAGNOSIS — Z01812 Encounter for preprocedural laboratory examination: Secondary | ICD-10-CM | POA: Insufficient documentation

## 2018-12-20 HISTORY — DX: Nausea with vomiting, unspecified: R11.2

## 2018-12-20 HISTORY — DX: Other specified postprocedural states: Z98.890

## 2018-12-20 LAB — BASIC METABOLIC PANEL
Anion gap: 9 (ref 5–15)
BUN: 19 mg/dL (ref 6–20)
CALCIUM: 9.3 mg/dL (ref 8.9–10.3)
CO2: 23 mmol/L (ref 22–32)
CREATININE: 0.99 mg/dL (ref 0.61–1.24)
Chloride: 107 mmol/L (ref 98–111)
GFR calc Af Amer: >60 mL/min (ref >60)
GFR calc non Af Amer: >60 mL/min (ref >60)
Glucose, Bld: 96 mg/dL (ref 70–99)
Potassium: 3.9 mmol/L (ref 3.5–5.1)
Sodium: 139 mmol/L (ref 135–145)

## 2018-12-20 LAB — TYPE AND SCREEN
ABO/RH(D): A POS
ANTIBODY SCREEN: NEGATIVE

## 2018-12-20 LAB — CBC
HCT: 44.3 % (ref 39.0–52.0)
Hemoglobin: 14.7 g/dL (ref 13.0–17.0)
MCH: 28.9 pg (ref 26.0–34.0)
MCHC: 33.2 g/dL (ref 30.0–36.0)
MCV: 87 fL (ref 80.0–100.0)
Platelets: 253 10*3/uL (ref 150–400)
RBC: 5.09 MIL/uL (ref 4.22–5.81)
RDW: 13.3 % (ref 11.5–15.5)
WBC: 12.3 10*3/uL — ABNORMAL HIGH (ref 4.0–10.5)
nRBC: 0 % (ref 0.0–0.2)

## 2018-12-20 LAB — SURGICAL PCR SCREEN
MRSA, PCR: NEGATIVE
Staphylococcus aureus: NEGATIVE

## 2018-12-20 NOTE — Progress Notes (Signed)
PCP - Dr. Sterling Big Cardiologist - Dr. Abelardo Diesel  Chest x-ray - N/A EKG -  06/20/2018 Stress Test - 11/14/2018 ECHO - 11/14/2018 Cardiac Cath - 03/26/2017  Sleep Study - denies CPAP - N/A  Blood Thinner Instructions: N/A Aspirin Instructions: N/A  Anesthesia review: No  Patient denies shortness of breath, fever, cough and chest pain at PAT appointment  Patient verbalized understanding of instructions that were given to them at the PAT appointment. Patient was also instructed that they will need to review over the PAT instructions again at home before surgery.

## 2018-12-20 NOTE — Pre-Procedure Instructions (Signed)
KEBBA BERCIER  12/20/2018    Your procedure is scheduled on March 23rd, 2020.  Report to [facility] Entrance A at 11:50 A.M.  Call this number if you have problems the morning of surgery:  [phone]   Remember:  Do not eat or drink after midnight. This includes gum and hard candy.    Take these medicines the morning of surgery with A SIP OF WATER:  propranolol (INDERAL)  If needed - traMADol (ULTRAM)    Follow your surgeon's instructions on when to stop Aspirin.  If no instructions were given by your surgeon then you will need to call the office to get those instructions.    7 days prior to surgery STOP taking any Aspirin (unless otherwise instructed by your surgeon), Aleve, Naproxen, Ibuprofen, Motrin, Advil, Goody's, BC's, all herbal medications, fish oil, and all vitamins.   Do not wear jewelry, make-up or nail polish.  Do not wear lotions, powders, or perfumes, or deodorant.  Do not shave 48 hours prior to surgery.  Men may shave face and neck.  Do not bring valuables to the hospital.  [facility] is not responsible for any belongings or valuables.  Contacts, dentures or bridgework may not be worn into surgery.  Leave your suitcase in the car.  After surgery it may be brought to your room.  For patients admitted to the hospital, discharge time will be determined by your treatment team.  Patients discharged the day of surgery will not be allowed to drive home.    [place]- Preparing For Surgery  Before surgery, you can play an important role. Because skin is not sterile, your skin needs to be as free of germs as possible. You can reduce the number of germs on your skin by washing with CHG (chlorahexidine gluconate) Soap before surgery.  CHG is an antiseptic cleaner which kills germs and bonds with the skin to continue killing germs even after washing.    Oral Hygiene is also important to reduce your risk of infection.  Remember - BRUSH YOUR TEETH THE  MORNING OF SURGERY WITH YOUR REGULAR TOOTHPASTE  Please do not use if you have an allergy to CHG or antibacterial soaps. If your skin becomes reddened/irritated stop using the CHG.  Do not shave (including legs and underarms) for at least 48 hours prior to first CHG shower. It is OK to shave your face.  Please follow these instructions carefully.   1. Shower the NIGHT BEFORE SURGERY and the MORNING OF SURGERY with CHG.   2. If you chose to wash your hair, wash your hair first as usual with your normal shampoo.  3. After you shampoo, rinse your hair and body thoroughly to remove the shampoo.  4. Use CHG as you would any other liquid soap. You can apply CHG directly to the skin and wash gently with a scrungie or a clean washcloth.   5. Apply the CHG Soap to your body ONLY FROM THE NECK DOWN.  Do not use on open wounds or open sores. Avoid contact with your eyes, ears, mouth and genitals (private parts). Wash Face and genitals (private parts)  with your normal soap.  6. Wash thoroughly, paying special attention to the area where your surgery will be performed.  7. Thoroughly rinse your body with warm water from the neck down.  8. DO NOT shower/wash with your normal soap after using and rinsing off the CHG Soap.  9. Pat yourself dry with a CLEAN TOWEL.  10. Wear CLEAN PAJAMAS to bed the night before surgery, wear comfortable clothes the morning of surgery  11. Place CLEAN SHEETS on your bed the night of your first shower and DO NOT SLEEP WITH PETS.  Day of Surgery:  Do not apply any deodorants/lotions.  Please wear clean clothes to the hospital/surgery center.   Remember to brush your teeth WITH YOUR REGULAR TOOTHPASTE.

## 2018-12-21 ENCOUNTER — Other Ambulatory Visit: Payer: Self-pay | Admitting: Rheumatology

## 2018-12-21 NOTE — Telephone Encounter (Signed)
Last Visit: 09/20/18 Next Visit due May 2020 Labs: 12/20/18 WBC 12.3  TB Gold: 06/10/18  Okay to refill per Dr. Corliss Skains

## 2018-12-23 MED ORDER — DEXTROSE 5 % IV SOLN
3.0000 g | INTRAVENOUS | Status: AC
  Administered 2018-12-26: 3 g via INTRAVENOUS
  Filled 2018-12-23: qty 3000
  Filled 2018-12-23: qty 3

## 2018-12-26 ENCOUNTER — Inpatient Hospital Stay (HOSPITAL_COMMUNITY): Payer: BLUE CROSS/BLUE SHIELD

## 2018-12-26 ENCOUNTER — Encounter (HOSPITAL_COMMUNITY): Payer: Self-pay | Admitting: *Deleted

## 2018-12-26 ENCOUNTER — Inpatient Hospital Stay (HOSPITAL_COMMUNITY)
Admission: RE | Admit: 2018-12-26 | Discharge: 2018-12-27 | DRG: 460 | Disposition: A | Discharge status: Home / Self Care | Payer: BLUE CROSS/BLUE SHIELD | Attending: Neurological Surgery | Admitting: Neurological Surgery

## 2018-12-26 ENCOUNTER — Other Ambulatory Visit: Payer: Self-pay

## 2018-12-26 ENCOUNTER — Inpatient Hospital Stay (HOSPITAL_COMMUNITY): Payer: BLUE CROSS/BLUE SHIELD | Admitting: Anesthesiology

## 2018-12-26 ENCOUNTER — Encounter (HOSPITAL_COMMUNITY)
Admission: RE | Disposition: A | Discharge status: Home / Self Care | Payer: Self-pay | Source: Home / Self Care | Attending: Neurological Surgery

## 2018-12-26 DIAGNOSIS — M48062 Spinal stenosis, lumbar region with neurogenic claudication: Secondary | ICD-10-CM | POA: Diagnosis present

## 2018-12-26 DIAGNOSIS — Z79899 Other long term (current) drug therapy: Secondary | ICD-10-CM | POA: Diagnosis not present

## 2018-12-26 DIAGNOSIS — Z87891 Personal history of nicotine dependence: Secondary | ICD-10-CM

## 2018-12-26 DIAGNOSIS — M47816 Spondylosis without myelopathy or radiculopathy, lumbar region: Secondary | ICD-10-CM | POA: Diagnosis present

## 2018-12-26 DIAGNOSIS — I10 Essential (primary) hypertension: Secondary | ICD-10-CM | POA: Diagnosis present

## 2018-12-26 DIAGNOSIS — M069 Rheumatoid arthritis, unspecified: Secondary | ICD-10-CM | POA: Diagnosis present

## 2018-12-26 DIAGNOSIS — Z419 Encounter for procedure for purposes other than remedying health state, unspecified: Secondary | ICD-10-CM

## 2018-12-26 DIAGNOSIS — E2740 Unspecified adrenocortical insufficiency: Secondary | ICD-10-CM | POA: Diagnosis present

## 2018-12-26 DIAGNOSIS — Z7952 Long term (current) use of systemic steroids: Secondary | ICD-10-CM

## 2018-12-26 DIAGNOSIS — M545 Low back pain: Secondary | ICD-10-CM | POA: Diagnosis present

## 2018-12-26 HISTORY — PX: ANTERIOR LAT LUMBAR FUSION: SHX1168

## 2018-12-26 SURGERY — ANTERIOR LATERAL LUMBAR FUSION 1 LEVEL
Anesthesia: General | Site: Spine Lumbar | Laterality: Left

## 2018-12-26 MED ORDER — PROPOFOL 10 MG/ML IV BOLUS
INTRAVENOUS | Status: DC | PRN
  Administered 2018-12-26: 30 mg via INTRAVENOUS
  Administered 2018-12-26: 180 mg via INTRAVENOUS
  Administered 2018-12-26: 20 mg via INTRAVENOUS

## 2018-12-26 MED ORDER — ROCURONIUM BROMIDE 50 MG/5ML IV SOSY
PREFILLED_SYRINGE | INTRAVENOUS | Status: AC
  Filled 2018-12-26: qty 5

## 2018-12-26 MED ORDER — FENTANYL CITRATE (PF) 250 MCG/5ML IJ SOLN
INTRAMUSCULAR | Status: AC
  Filled 2018-12-26: qty 5

## 2018-12-26 MED ORDER — LACTATED RINGERS IV SOLN
INTRAVENOUS | Status: DC | PRN
  Administered 2018-12-26 (×2): via INTRAVENOUS

## 2018-12-26 MED ORDER — LIDOCAINE-EPINEPHRINE 1 %-1:100000 IJ SOLN
INTRAMUSCULAR | Status: DC | PRN
  Administered 2018-12-26: 5 mL

## 2018-12-26 MED ORDER — OXYCODONE HCL 5 MG/5ML PO SOLN: 5.0000 mg | Freq: Once | ORAL | Status: AC | PRN

## 2018-12-26 MED ORDER — SENNA 8.6 MG PO TABS
1.0000 | ORAL_TABLET | Freq: Two times a day (BID) | ORAL | Status: DC
  Administered 2018-12-26 – 2018-12-27 (×2): 8.6 mg via ORAL
  Filled 2018-12-26 (×2): qty 1

## 2018-12-26 MED ORDER — 0.9 % SODIUM CHLORIDE (POUR BTL) OPTIME
TOPICAL | Status: DC | PRN
  Administered 2018-12-26: 1000 mL
  Administered 2018-12-26: 100 mL

## 2018-12-26 MED ORDER — PREDNISONE 5 MG PO TABS
10.0000 mg | ORAL_TABLET | Freq: Two times a day (BID) | ORAL | Status: DC
  Administered 2018-12-27: 10 mg via ORAL
  Filled 2018-12-26: qty 2

## 2018-12-26 MED ORDER — PROPOFOL 500 MG/50ML IV EMUL
INTRAVENOUS | Status: DC | PRN
  Administered 2018-12-26: 50 ug/kg/min via INTRAVENOUS

## 2018-12-26 MED ORDER — TRAMADOL HCL 50 MG PO TABS: 50.0000 mg | ORAL_TABLET | Freq: Four times a day (QID) | ORAL | Status: DC | PRN

## 2018-12-26 MED ORDER — MIDAZOLAM HCL 2 MG/2ML IJ SOLN
INTRAMUSCULAR | Status: DC | PRN
  Administered 2018-12-26: 2 mg via INTRAVENOUS

## 2018-12-26 MED ORDER — ONDANSETRON HCL 4 MG/2ML IJ SOLN
INTRAMUSCULAR | Status: DC | PRN
  Administered 2018-12-26: 4 mg via INTRAVENOUS

## 2018-12-26 MED ORDER — MIDAZOLAM HCL 2 MG/2ML IJ SOLN
INTRAMUSCULAR | Status: AC
  Filled 2018-12-26: qty 2

## 2018-12-26 MED ORDER — IRBESARTAN 150 MG PO TABS
150.0000 mg | ORAL_TABLET | Freq: Every day | ORAL | Status: DC
  Administered 2018-12-27: 150 mg via ORAL
  Filled 2018-12-26: qty 1

## 2018-12-26 MED ORDER — CHLORHEXIDINE GLUCONATE CLOTH 2 % EX PADS: 6.0000 | MEDICATED_PAD | Freq: Once | CUTANEOUS | Status: DC

## 2018-12-26 MED ORDER — PREDNISONE 5 MG PO TABS: 5.0000 mg | ORAL_TABLET | Freq: Every day | ORAL | Status: DC

## 2018-12-26 MED ORDER — THROMBIN 5000 UNITS EX SOLR
CUTANEOUS | Status: AC
  Filled 2018-12-26: qty 5000

## 2018-12-26 MED ORDER — FENTANYL CITRATE (PF) 100 MCG/2ML IJ SOLN
25.0000 ug | INTRAMUSCULAR | Status: DC | PRN
  Administered 2018-12-26 (×2): 50 ug via INTRAVENOUS

## 2018-12-26 MED ORDER — SUCCINYLCHOLINE CHLORIDE 200 MG/10ML IV SOSY
PREFILLED_SYRINGE | INTRAVENOUS | Status: DC | PRN
  Administered 2018-12-26: 140 mg via INTRAVENOUS

## 2018-12-26 MED ORDER — BUPIVACAINE HCL (PF) 0.5 % IJ SOLN
INTRAMUSCULAR | Status: AC
  Filled 2018-12-26: qty 30

## 2018-12-26 MED ORDER — METHOCARBAMOL 500 MG PO TABS
500.0000 mg | ORAL_TABLET | Freq: Four times a day (QID) | ORAL | Status: DC | PRN
  Administered 2018-12-26 – 2018-12-27 (×4): 500 mg via ORAL
  Filled 2018-12-26 (×4): qty 1

## 2018-12-26 MED ORDER — DEXAMETHASONE SODIUM PHOSPHATE 10 MG/ML IJ SOLN
INTRAMUSCULAR | Status: AC
  Filled 2018-12-26: qty 1

## 2018-12-26 MED ORDER — FLEET ENEMA 7-19 GM/118ML RE ENEM: 1.0000 | ENEMA | Freq: Once | RECTAL | Status: DC | PRN

## 2018-12-26 MED ORDER — SODIUM CHLORIDE 0.9 % IV SOLN: 250.0000 mL | INTRAVENOUS | Status: DC

## 2018-12-26 MED ORDER — SUCCINYLCHOLINE CHLORIDE 200 MG/10ML IV SOSY
PREFILLED_SYRINGE | INTRAVENOUS | Status: AC
  Filled 2018-12-26: qty 10

## 2018-12-26 MED ORDER — ONDANSETRON HCL 4 MG PO TABS: 4.0000 mg | ORAL_TABLET | Freq: Four times a day (QID) | ORAL | Status: DC | PRN

## 2018-12-26 MED ORDER — METHOCARBAMOL 1000 MG/10ML IJ SOLN
500.0000 mg | Freq: Four times a day (QID) | INTRAVENOUS | Status: DC | PRN
  Filled 2018-12-26: qty 5

## 2018-12-26 MED ORDER — OXYCODONE HCL 5 MG PO TABS
5.0000 mg | ORAL_TABLET | Freq: Once | ORAL | Status: AC | PRN
  Administered 2018-12-26: 5 mg via ORAL

## 2018-12-26 MED ORDER — KETOROLAC TROMETHAMINE 15 MG/ML IJ SOLN
INTRAMUSCULAR | Status: AC
  Filled 2018-12-26: qty 1

## 2018-12-26 MED ORDER — ONDANSETRON HCL 4 MG/2ML IJ SOLN: 4.0000 mg | Freq: Four times a day (QID) | INTRAMUSCULAR | Status: DC | PRN

## 2018-12-26 MED ORDER — DOCUSATE SODIUM 100 MG PO CAPS
100.0000 mg | ORAL_CAPSULE | Freq: Two times a day (BID) | ORAL | Status: DC
  Administered 2018-12-26 – 2018-12-27 (×2): 100 mg via ORAL
  Filled 2018-12-26 (×2): qty 1

## 2018-12-26 MED ORDER — FENTANYL CITRATE (PF) 250 MCG/5ML IJ SOLN
INTRAMUSCULAR | Status: DC | PRN
  Administered 2018-12-26: 100 ug via INTRAVENOUS
  Administered 2018-12-26: 150 ug via INTRAVENOUS

## 2018-12-26 MED ORDER — LIDOCAINE-EPINEPHRINE 1 %-1:100000 IJ SOLN
INTRAMUSCULAR | Status: AC
  Filled 2018-12-26: qty 1

## 2018-12-26 MED ORDER — OXYCODONE-ACETAMINOPHEN 5-325 MG PO TABS
1.0000 | ORAL_TABLET | ORAL | Status: DC | PRN
  Administered 2018-12-26 – 2018-12-27 (×6): 2 via ORAL
  Filled 2018-12-26 (×6): qty 2

## 2018-12-26 MED ORDER — LIDOCAINE 2% (20 MG/ML) 5 ML SYRINGE
INTRAMUSCULAR | Status: DC | PRN
  Administered 2018-12-26: 100 mg via INTRAVENOUS

## 2018-12-26 MED ORDER — METHYLPREDNISOLONE SODIUM SUCC 125 MG IJ SOLR
INTRAMUSCULAR | Status: DC | PRN
  Administered 2018-12-26: 125 mg via INTRAVENOUS

## 2018-12-26 MED ORDER — PROPRANOLOL HCL 10 MG PO TABS
10.0000 mg | ORAL_TABLET | Freq: Three times a day (TID) | ORAL | Status: DC
  Administered 2018-12-26 – 2018-12-27 (×3): 10 mg via ORAL
  Filled 2018-12-26 (×4): qty 1

## 2018-12-26 MED ORDER — KETOROLAC TROMETHAMINE 15 MG/ML IJ SOLN
15.0000 mg | Freq: Four times a day (QID) | INTRAMUSCULAR | Status: AC
  Administered 2018-12-26 – 2018-12-27 (×4): 15 mg via INTRAVENOUS
  Filled 2018-12-26 (×3): qty 1

## 2018-12-26 MED ORDER — FENTANYL CITRATE (PF) 100 MCG/2ML IJ SOLN
INTRAMUSCULAR | Status: AC
  Filled 2018-12-26: qty 2

## 2018-12-26 MED ORDER — POLYETHYLENE GLYCOL 3350 17 G PO PACK: 17.0000 g | PACK | Freq: Every day | ORAL | Status: DC | PRN

## 2018-12-26 MED ORDER — ALUM & MAG HYDROXIDE-SIMETH 200-200-20 MG/5ML PO SUSP: 30.0000 mL | Freq: Four times a day (QID) | ORAL | Status: DC | PRN

## 2018-12-26 MED ORDER — ACETAMINOPHEN 650 MG RE SUPP: 650.0000 mg | RECTAL | Status: DC | PRN

## 2018-12-26 MED ORDER — MORPHINE SULFATE (PF) 2 MG/ML IV SOLN
2.0000 mg | INTRAVENOUS | Status: DC | PRN
  Administered 2018-12-26: 2 mg via INTRAVENOUS
  Filled 2018-12-26: qty 2

## 2018-12-26 MED ORDER — BISACODYL 10 MG RE SUPP: 10.0000 mg | Freq: Every day | RECTAL | Status: DC | PRN

## 2018-12-26 MED ORDER — SODIUM CHLORIDE 0.9 % IV SOLN
INTRAVENOUS | Status: DC | PRN
  Administered 2018-12-26: 500 mL

## 2018-12-26 MED ORDER — LIDOCAINE 2% (20 MG/ML) 5 ML SYRINGE
INTRAMUSCULAR | Status: AC
  Filled 2018-12-26: qty 5

## 2018-12-26 MED ORDER — THROMBIN 5000 UNITS EX SOLR
OROMUCOSAL | Status: DC | PRN
  Administered 2018-12-26: 5 mL via TOPICAL

## 2018-12-26 MED ORDER — OXYCODONE HCL 5 MG PO TABS
ORAL_TABLET | ORAL | Status: AC
  Filled 2018-12-26: qty 1

## 2018-12-26 MED ORDER — PHENOL 1.4 % MT LIQD: 1.0000 | OROMUCOSAL | Status: DC | PRN

## 2018-12-26 MED ORDER — PROPOFOL 10 MG/ML IV BOLUS
INTRAVENOUS | Status: AC
  Filled 2018-12-26: qty 20

## 2018-12-26 MED ORDER — SODIUM CHLORIDE 0.9% FLUSH: 3.0000 mL | INTRAVENOUS | Status: DC | PRN

## 2018-12-26 MED ORDER — FUROSEMIDE 20 MG PO TABS
20.0000 mg | ORAL_TABLET | Freq: Two times a day (BID) | ORAL | Status: DC
  Administered 2018-12-26: 20 mg via ORAL
  Filled 2018-12-26 (×2): qty 1

## 2018-12-26 MED ORDER — ONDANSETRON HCL 4 MG/2ML IJ SOLN
INTRAMUSCULAR | Status: AC
  Filled 2018-12-26: qty 2

## 2018-12-26 MED ORDER — ACETAMINOPHEN 325 MG PO TABS: 650.0000 mg | ORAL_TABLET | ORAL | Status: DC | PRN

## 2018-12-26 MED ORDER — SODIUM CHLORIDE 0.9% FLUSH
3.0000 mL | Freq: Two times a day (BID) | INTRAVENOUS | Status: DC
  Administered 2018-12-26 – 2018-12-27 (×2): 3 mL via INTRAVENOUS

## 2018-12-26 MED ORDER — MENTHOL 3 MG MT LOZG: 1.0000 | LOZENGE | OROMUCOSAL | Status: DC | PRN

## 2018-12-26 MED ORDER — BUPIVACAINE HCL (PF) 0.5 % IJ SOLN
INTRAMUSCULAR | Status: DC | PRN
  Administered 2018-12-26: 5 mL

## 2018-12-26 MED ORDER — LEFLUNOMIDE 20 MG PO TABS
20.0000 mg | ORAL_TABLET | Freq: Every day | ORAL | Status: DC
  Filled 2018-12-26 (×2): qty 1

## 2018-12-26 SURGICAL SUPPLY — 49 items
ADH SKN CLS APL DERMABOND .7 (GAUZE/BANDAGES/DRESSINGS) ×1
BLADE CLIPPER SURG (BLADE) IMPLANT
BOLT PLATE XLIF 5.5X55 LRG (Bolt) ×4 IMPLANT
BONE MATRIX OSTEOCEL PRO MED (Bone Implant) ×1 IMPLANT
COVER WAND RF STERILE (DRAPES) ×1 IMPLANT
DERMABOND ADVANCED (GAUZE/BANDAGES/DRESSINGS) ×1
DERMABOND ADVANCED .7 DNX12 (GAUZE/BANDAGES/DRESSINGS) ×1 IMPLANT
DRAPE C-ARM 42X72 X-RAY (DRAPES) ×2 IMPLANT
DRAPE C-ARMOR (DRAPES) ×2 IMPLANT
DRAPE LAPAROTOMY 100X72X124 (DRAPES) ×2 IMPLANT
DRAPE POUCH INSTRU U-SHP 10X18 (DRAPES) ×1 IMPLANT
DURAPREP 26ML APPLICATOR (WOUND CARE) ×2 IMPLANT
ELECT REM PT RETURN 9FT ADLT (ELECTROSURGICAL) ×2
ELECTRODE REM PT RTRN 9FT ADLT (ELECTROSURGICAL) ×1 IMPLANT
GAUZE 4X4 16PLY RFD (DISPOSABLE) IMPLANT
GLOVE BIO SURGEON STRL SZ 6.5 (GLOVE) ×3 IMPLANT
GLOVE BIO SURGEON STRL SZ7 (GLOVE) ×1 IMPLANT
GLOVE BIO SURGEON STRL SZ8 (GLOVE) ×1 IMPLANT
GLOVE BIO SURGEON STRL SZ8.5 (GLOVE) ×1 IMPLANT
GLOVE BIOGEL PI IND STRL 6.5 (GLOVE) IMPLANT
GLOVE BIOGEL PI IND STRL 8.5 (GLOVE) ×1 IMPLANT
GLOVE BIOGEL PI INDICATOR 6.5 (GLOVE) ×1
GLOVE BIOGEL PI INDICATOR 8.5 (GLOVE) ×1
GLOVE ECLIPSE 8.5 STRL (GLOVE) ×2 IMPLANT
GLOVE EXAM NITRILE XL STR (GLOVE) IMPLANT
GOWN STRL REUS W/ TWL LRG LVL3 (GOWN DISPOSABLE) ×2 IMPLANT
GOWN STRL REUS W/ TWL XL LVL3 (GOWN DISPOSABLE) ×1 IMPLANT
GOWN STRL REUS W/TWL 2XL LVL3 (GOWN DISPOSABLE) ×2 IMPLANT
GOWN STRL REUS W/TWL LRG LVL3 (GOWN DISPOSABLE) ×4
GOWN STRL REUS W/TWL XL LVL3 (GOWN DISPOSABLE) ×2
HEMOSTAT POWDER SURGIFOAM 1G (HEMOSTASIS) ×1 IMPLANT
KIT BASIN OR (CUSTOM PROCEDURE TRAY) ×2 IMPLANT
KIT DILATOR XLIF 5 (KITS) ×1 IMPLANT
KIT SURGICAL ACCESS MAXCESS 4 (KITS) ×1 IMPLANT
KIT TURNOVER KIT B (KITS) ×2 IMPLANT
MODULE NVM5 NEXT GEN EMG (NEEDLE) ×1 IMPLANT
MODULUS XLW 12X22X55MM 10 (Spine Construct) ×1 IMPLANT
NDL HYPO 25X1 1.5 SAFETY (NEEDLE) ×1 IMPLANT
NEEDLE HYPO 25X1 1.5 SAFETY (NEEDLE) ×2 IMPLANT
NS IRRIG 1000ML POUR BTL (IV SOLUTION) ×3 IMPLANT
PACK LAMINECTOMY NEURO (CUSTOM PROCEDURE TRAY) ×2 IMPLANT
PLATE DECADE XLIP 2H SZ12 (Plate) ×2 IMPLANT
SPONGE LAP 4X18 RFD (DISPOSABLE) IMPLANT
SUT VIC AB 2-0 CP2 18 (SUTURE) ×2 IMPLANT
SUT VIC AB 3-0 SH 8-18 (SUTURE) ×3 IMPLANT
TOWEL GREEN STERILE (TOWEL DISPOSABLE) ×2 IMPLANT
TOWEL GREEN STERILE FF (TOWEL DISPOSABLE) ×2 IMPLANT
TRAY FOLEY MTR SLVR 16FR STAT (SET/KITS/TRAYS/PACK) ×2 IMPLANT
WATER STERILE IRR 1000ML POUR (IV SOLUTION) ×2 IMPLANT

## 2018-12-26 NOTE — Evaluation (Signed)
Physical Therapy Evaluation Patient Details Name: Gregory Hinton MRN: [id number] DOB: [birth year] Today's Date: 12/26/2018   History of Present Illness  Pt is a 53 y/o male s/p L2-3 ALIF. PMH includes HTN, RA, and back surgery.   Clinical Impression  Patient is s/p above surgery resulting in the deficits listed below (see PT Problem List). Pt with increased pain this session which limited gait distance. Required min guard with use of RW for transfers and ambulation. Reviewed back precautions and generalized walking program. Patient will benefit from skilled PT to increase their independence and safety with mobility (while adhering to their precautions) to allow discharge to the venue listed below.     Follow Up Recommendations No PT follow up;Supervision for mobility/OOB(would benefit from outpatient PT once cleared)    Equipment Recommendations  None recommended by PT    Recommendations for Other Services       Precautions / Restrictions Precautions Precautions: Back Precaution Booklet Issued: Yes (comment) Precaution Comments: Reviewed back precautions with pt.  Required Braces or Orthoses: Spinal Brace Spinal Brace: Lumbar corset;Applied in sitting position Restrictions Weight Bearing Restrictions: No      Mobility  Bed Mobility Overal bed mobility: Needs Assistance Bed Mobility: Rolling;Sidelying to Sit;Sit to Sidelying Rolling: Supervision Sidelying to sit: Supervision     Sit to sidelying: Min assist General bed mobility comments: Supervision to perform log roll technique to come to sitting. Required use of bed rails. Min A for LE assist for return to sidelying at end of session. Cues for log roll technique.   Transfers Overall transfer level: Needs assistance Equipment used: Rolling walker (2 wheeled) Transfers: Sit to/from Stand Sit to Stand: Min guard;From elevated surface         General transfer comment: Min guard for safety to stand from elevated  surface. Cues for safe hand placement when using RW.   Ambulation/Gait Ambulation/Gait assistance: Min guard Gait Distance (Feet): 50 Feet Assistive device: Rolling walker (2 wheeled) Gait Pattern/deviations: Step-through pattern;Decreased stride length Gait velocity: Decreased    General Gait Details: Slow, guarded gait secondary to pain. Min guard for safety. Educated about generalized walking program to perform at home.   Stairs            Wheelchair Mobility    Modified Rankin (Stroke Patients Only)       Balance Overall balance assessment: Needs assistance Sitting-balance support: No upper extremity supported;Feet supported Sitting balance-Leahy Scale: Good     Standing balance support: Bilateral upper extremity supported;During functional activity Standing balance-Leahy Scale: Poor Standing balance comment: Reliant on BUE support                              Pertinent Vitals/Pain Pain Assessment: Faces Faces Pain Scale: Hurts even more Pain Location: back Pain Descriptors / Indicators: Operative site guarding;Aching Pain Intervention(s): Limited activity within patient's tolerance;Monitored during session;Repositioned    Home Living Family/patient expects to be discharged to:: Private residence Living Arrangements: Spouse/significant other Available Help at Discharge: Family;Available 24 hours/day Type of Home: House Home Access: Ramped entrance     Home Layout: Multi-level;Able to live on main level with bedroom/bathroom Home Equipment: Dan Humphreys - 2 wheels;Bedside commode      Prior Function Level of Independence: Independent         Comments: Was still working      Hand Dominance        Extremity/Trunk Assessment   Upper Extremity Assessment Upper  Extremity Assessment: Defer to OT evaluation    Lower Extremity Assessment Lower Extremity Assessment: Generalized weakness    Cervical / Trunk Assessment Cervical / Trunk  Assessment: Other exceptions Cervical / Trunk Exceptions: s/p lumbar surgery   Communication   Communication: No difficulties  Cognition Arousal/Alertness: Awake/alert Behavior During Therapy: WFL for tasks assessed/performed Overall Cognitive Status: Within Functional Limits for tasks assessed                                        General Comments      Exercises     Assessment/Plan    PT Assessment Patient needs continued PT services  PT Problem List Decreased strength;Decreased mobility;Decreased balance;Decreased activity tolerance;Decreased knowledge of use of DME;Decreased knowledge of precautions;Pain       PT Treatment Interventions DME instruction;Gait training;Functional mobility training;Therapeutic activities;Therapeutic exercise;Balance training;Patient/family education    PT Goals (Current goals can be found in the Care Plan section)  Acute Rehab PT Goals Patient Stated Goal: to go home  PT Goal Formulation: With patient Time For Goal Achievement: 01/09/19 Potential to Achieve Goals: Good    Frequency Min 5X/week   Barriers to discharge        Co-evaluation               AM-PAC PT "6 Clicks" Mobility  Outcome Measure Help needed turning from your back to your side while in a flat bed without using bedrails?: A Little Help needed moving from lying on your back to sitting on the side of a flat bed without using bedrails?: A Little Help needed moving to and from a bed to a chair (including a wheelchair)?: A Little Help needed standing up from a chair using your arms (e.g., wheelchair or bedside chair)?: A Little Help needed to walk in hospital room?: A Little Help needed climbing 3-5 steps with a railing? : A Lot 6 Click Score: 17    End of Session Equipment Utilized During Treatment: Gait belt;Back brace Activity Tolerance: Patient tolerated treatment well Patient left: in bed;with call bell/phone within reach Nurse  Communication: Mobility status PT Visit Diagnosis: Other abnormalities of gait and mobility (R26.89);Pain Pain - part of body: (back)    Time: 8921-1941 PT Time Calculation (min) (ACUTE ONLY): 23 min   Charges:   PT Evaluation $PT Eval Low Complexity: 1 Low PT Treatments $Gait Training: 8-22 mins        Gladys Damme, PT, DPT  Acute Rehabilitation Services  Pager: [phone] Office: [phone]   Gregory [place] 12/26/2018, 3:28 PM

## 2018-12-26 NOTE — Anesthesia Preprocedure Evaluation (Signed)
Anesthesia Evaluation  Patient identified by MRN, date of birth, ID band Patient awake    Reviewed: Allergy & Precautions, H&P , NPO status , Patient's Chart, lab work & pertinent test results  History of Anesthesia Complications (+) PONV and history of anesthetic complications  Airway Mallampati: II   Neck ROM: full    Dental   Pulmonary former smoker,    breath sounds clear to auscultation       Cardiovascular hypertension,  Rhythm:regular Rate:Normal     Neuro/Psych PSYCHIATRIC DISORDERS Anxiety    GI/Hepatic   Endo/Other  Adrenal insufficiency, on prednisone  Renal/GU      Musculoskeletal  (+) Arthritis ,   Abdominal   Peds  Hematology   Anesthesia Other Findings   Reproductive/Obstetrics                             Anesthesia Physical Anesthesia Plan  ASA: II  Anesthesia Plan: General   Post-op Pain Management:    Induction: Intravenous  PONV Risk Score and Plan: 3 and Dexamethasone, Ondansetron, Midazolam and Treatment may vary due to age or medical condition  Airway Management Planned: Oral ETT  Additional Equipment:   Intra-op Plan:   Post-operative Plan: Extubation in OR  Informed Consent: I have reviewed the patients History and Physical, chart, labs and discussed the procedure including the risks, benefits and alternatives for the proposed anesthesia with the patient or authorized representative who has indicated his/her understanding and acceptance.       Plan Discussed with: CRNA, Anesthesiologist and Surgeon  Anesthesia Plan Comments:         Anesthesia Quick Evaluation

## 2018-12-26 NOTE — Anesthesia Procedure Notes (Signed)
Procedure Name: Intubation Date/Time: 12/26/2018 7:46 AM Performed by: Bryson Corona, CRNA Pre-anesthesia Checklist: Patient identified, Emergency Drugs available, Suction available and Patient being monitored Patient Re-evaluated:Patient Re-evaluated prior to induction Oxygen Delivery Method: Circle System Utilized Preoxygenation: Pre-oxygenation with 100% oxygen Induction Type: IV induction Laryngoscope Size: Mac and 4 Grade View: Grade I Tube type: Oral Tube size: 7.0 mm Number of attempts: 1 Airway Equipment and Method: Stylet and Oral airway Placement Confirmation: ETT inserted through vocal cords under direct vision,  positive ETCO2 and breath sounds checked- equal and bilateral Secured at: 22 cm Tube secured with: Tape Dental Injury: Teeth and Oropharynx as per pre-operative assessment

## 2018-12-26 NOTE — H&P (Signed)
Gregory Hinton is an 53 y.o. male.   Chief Complaint: Progressive leg weakness HPI: Gregory Hinton is a 53 year old patient whom I have followed for a number of years.  Said previous decompressive surgery in the lower lumbar spine for spondylolisthesis with stenosis that caused bilateral foot drops.  He recovered from that and has done reasonably well but in the last year he notes that there is been some progressive loss of stamina while on his feet in addition he has had an increase in back pain.   Past Medical History:  Diagnosis Date  . Adrenal insufficiency (HCC)   . Anxiety   . Arthritis   . Back pain    had 3 fusions surgeries  . H/O cardiovascular stress test 2014   admitted at hosp. overnight for chest pain, stress/echo test wnl  . Hypertension   . PONV (postoperative nausea and vomiting)   . Rheumatoid arthritis (HCC)    manages by Dr. Corliss Skains, rec'd Orencia weekly for 2 months, switched from Humira     Past Surgical History:  Procedure Laterality Date  . BACK SURGERY  [phone]   lumbar fusion 3 times  . CARPAL TUNNEL RELEASE Right 2000   and left side 2 years ago with left ulnar surgery  . COLONOSCOPY    . HERNIA REPAIR Right 2001   inguinal     Family History  Problem Relation Age of Onset  . Aneurysm Mother   . Leukemia Father   . Healthy Daughter   . Healthy Son    Social History:  reports that he quit smoking about 20 years ago. He has never used smokeless tobacco. He reports current alcohol use of about 1.0 standard drinks of alcohol per week. He reports that he does not use drugs.  Allergies:  Allergies  Allergen Reactions  . Eggs Or Egg-Derived Products Diarrhea and Nausea And Vomiting    Facility-Administered Medications Prior to Admission  Medication Dose Route Frequency Provider Last Rate Last Dose  . 0.9 %  sodium chloride infusion  500 mL Intravenous Once Lynann Bologna, MD       Medications Prior to Admission  Medication Sig  Dispense Refill  . cholecalciferol (VITAMIN D) 1000 units tablet Take 1,000 Units by mouth daily.     . furosemide (LASIX) 20 MG tablet Take 20 mg by mouth 2 (two) times daily.     Marland Kitchen ibuprofen (ADVIL,MOTRIN) 200 MG tablet Take 800 mg by mouth every 8 (eight) hours as needed for mild pain or moderate pain.     Marland Kitchen leflunomide (ARAVA) 20 MG tablet Take 1 tablet (20 mg total) by mouth daily. 30 tablet 2  . olmesartan (BENICAR) 20 MG tablet Take 20 mg by mouth at bedtime.    . predniSONE (DELTASONE) 5 MG tablet Take 5 mg by mouth daily with breakfast.     . propranolol (INDERAL) 10 MG tablet Take 10 mg by mouth 3 (three) times daily.     . traMADol (ULTRAM) 50 MG tablet Take 50 mg by mouth every 6 (six) hours as needed for moderate pain.     Marland Kitchen HYDROcodone-acetaminophen (NORCO/VICODIN) 5-325 MG tablet Take 1 tablet by mouth every 6 (six) hours as needed. (Patient not taking: Reported on 09/20/2018) 20 tablet 0  . ORENCIA CLICKJECT 125 MG/ML SOAJ INJECT 125 MG UNDER THE SKIN (SUBCUTANEOUS INJECTION) ONCE A WEEK. 12 Syringe 0    No results found for this or any previous visit (from the past 48 hour(s)). No results  found.  Review of Systems  Constitutional: Negative.   HENT: Negative.   Eyes: Negative.   Respiratory: Negative.   Cardiovascular: Negative.   Gastrointestinal: Negative.   Genitourinary: Negative.   Musculoskeletal: Positive for back pain.  Skin: Negative.   Neurological:       Weakness of the lower extremities and loss of stamina  Endo/Heme/Allergies: Negative.   Psychiatric/Behavioral: Negative.     Blood pressure (!) 135/95, pulse 73, temperature 98 F (36.7 C), resp. rate 20, height 6\' 4"  (1.93 m), weight 120.2 kg, SpO2 99 %. Physical Exam  Constitutional: He is oriented to person, place, and time. He appears well-developed and well-nourished.  HENT:  Head: Normocephalic and atraumatic.  Eyes: Pupils are equal, round, and reactive to light. Conjunctivae and EOM are  normal.  Neck: Normal range of motion. Neck supple.  Cardiovascular: Normal rate and regular rhythm.  Respiratory: Breath sounds normal.  GI: Soft. Bowel sounds are normal.  Musculoskeletal:     Comments: Significant back pain under to palpation and percussion straight leg raising is positive at 30 degrees Patrick's maneuver is negative bilaterally  Neurological: He is alert and oriented to person, place, and time.  Proximal weakness in the iliopsoas and quadriceps rated 4 out of 5 bilaterally.  Absent patellar and Achilles reflexes.  Upper extremity reflexes are intact motor strength in the upper extremities is normal cranial nerve examination is normal.  Skin: Skin is warm and dry.  Psychiatric: He has a normal mood and affect. His behavior is normal. Judgment and thought content normal.     Assessment/Plan Lumbar stenosis L2-L3,  With progressive lower extremity weakness.  History of previous lumbar fusion  Lan: Anterolateral decompression L2-L3 with lateral plate fixation   Stefani Dama, MD 12/26/2018, 7:34 AM

## 2018-12-26 NOTE — Transfer of Care (Signed)
Immediate Anesthesia Transfer of Care Note  Patient: Gregory Hinton  Procedure(s) Performed: Left Lumbar Two-Three Anterolateral decompression/interbody fusion/lateral plate (Left Spine Lumbar)  Patient Location: PACU  Anesthesia Type:General  Level of Consciousness: drowsy  Airway & Oxygen Therapy: Patient Spontanous Breathing and Patient connected to face mask oxygen  Post-op Assessment: Report given to RN and Post -op Vital signs reviewed and stable  Post vital signs: Reviewed and stable  Last Vitals:  Vitals Value Taken Time  BP 135/86 12/26/2018  9:43 AM  Temp    Pulse 78 12/26/2018  9:45 AM  Resp 21 12/26/2018  9:45 AM  SpO2 99 % 12/26/2018  9:45 AM  Vitals shown include unvalidated device data.  Last Pain: There were no vitals filed for this visit.       Complications: No apparent anesthesia complications

## 2018-12-26 NOTE — Progress Notes (Signed)
Orthopedic Tech Progress Note Patient Details:  Gregory Hinton [birth year] [id number] Called in brace order Patient ID: Gregory Hinton, male   DOB: 11/14/1965, 53 y.o.   MRN: [id number]   Gregory Hinton 12/26/2018, 11:23 AM

## 2018-12-26 NOTE — Op Note (Signed)
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  Procedure: Patient was brought to the operating room supine on the stretcher.  After the smooth induction of general tracheal anesthesia, he was placed in the right lateral decubitus position and was taped into position with the bony prominences being appropriately padded and protected.  Orthogonal positioning was checked with fluoroscopy.  When this was verified on the lateral part of his abdomen the location of L2-L3 was marked on the skin.  The skin was prepped with alcohol DuraPrep and draped in a sterile fashion.  A vertical incision was created on the left side and carried down through the subcutaneous tissues.  A second posterior incision was created and the retroperitoneum was identified after probing with a blunt hemostat.  Next a probe was passed through the lateral incision into the retroperitoneal space and carefully guided through the psoas muscle to the lateral aspect at L2-L3.  Positioning was checked radiographically and then a K wire was placed into the disc space at L2-L3 neural monitoring was performed superiorly inferiorly and  posteriorly and verified no proximity to any nerve roots.  Then a series of dilators were passed each time checking for any neural activity.  None was identified.  Then 150 mm long 3 quadrant retractor was placed over the outer tube and anchored into position with an external clamp.  Positioning was checked carefully and neuro monitoring was performed posterior to this retractor to check for any presence of neural activity when none was verified a shim was placed into the disc space at the level of L2-L3.  The space was then opened with a 15 blade and a series of rongeurs was used to remove some disc material ultimately a Cobb elevator could be passed through the opposite annular ligament in both directions and the disc space was opened a series of dilators was then used to open the interspace to a 12 mm height a series of rongeurs and curettes was then used to evacuate all the degenerated disc material off of the endplates from L2 and L3.  Disc material was removed from the more ventral aspect of the interspace.  Ultimately was felt that a 12 mm lordotic spacer measuring 22 mm anterior to posterior and 55 mm in width would fit best into this interval this was then filled with ostia cell and tamped into the interspace under direct fluoroscopic visualization.  Once the spacer was placed the retractor was opened further to allow placement of a lateral plate.  A 12 mm tall lateral plate was then fixed to the lateral aspect of the vertebral bodies with 55 mm screws measuring 5.5 mm in diameter.  This was checked fluoroscopically.  All during this time neural monitoring revealed no abnormal activity.  Once the screws were placed the final positioning was checked and they were  locked into position.  Then the retractor was removed and final radiographs were obtained in both the AP and lateral projection.  Good distraction of the interspace was obtained.  With this the incisions were closed with 2-0 Vicryl in the  subcutaneous tissues and 3-0 Vicryl subcuticularly.  Dermabond was placed on the skin.  The patient was then returned to recovery room in stable condition blood loss was estimated less than 50 cc.

## 2018-12-26 NOTE — Progress Notes (Addendum)
Patient ID: Gregory Hinton, male   DOB: 08/24/1966, 53 y.o.   MRN: [id number] Vital signs are stable Motor function appears intact postop dressings are dry Stable postop

## 2018-12-26 NOTE — Social Work (Signed)
CSW acknowledging consult for SNF placement. Will follow for therapy recommendations.   Omarii Scalzo, MSW, LCSWA [place] [facility] Social Work [phone]   

## 2018-12-27 ENCOUNTER — Encounter (HOSPITAL_COMMUNITY): Payer: Self-pay | Admitting: Neurological Surgery

## 2018-12-27 MED ORDER — METHOCARBAMOL 500 MG PO TABS: 500.0000 mg | ORAL_TABLET | Freq: Four times a day (QID) | ORAL | 3 refills | Status: DC | PRN

## 2018-12-27 MED ORDER — OXYCODONE-ACETAMINOPHEN 5-325 MG PO TABS: 1.0000 | ORAL_TABLET | ORAL | 0 refills | Status: DC | PRN

## 2018-12-27 NOTE — Progress Notes (Signed)
Pt with d/c orders. Discharge paperwork reviewed with pt and all questions answered. IV removed. Pt escorted to vehicle via wheelchair with all belongings.  

## 2018-12-27 NOTE — Discharge Summary (Signed)
Physician Discharge Summary  Patient ID: Gregory Hinton MRN: [id number] DOB/AGE: 54/01/1966 54 y.o.  Admit date: 12/26/2018 Discharge date: 12/27/2018  Admission Diagnoses: Lumbar  spondylosis with stenosis L2-L3, neurogenic claudication, progressive weakness.  History of fusion L3 to sacrum  Discharge Diagnoses: Lumbar spondylosis with stenosis L2-L3.  Neurogenic claudication.  Progressive weakness.  History of fusion L3 to sacrum Active Problems:   Lumbar stenosis with neurogenic claudication   Discharged Condition: good  Hospital Course: Patient was taken to the operating room where he underwent an anterolateral indirect decompression at L2-L3.  He tolerated surgery well.  He also had lateral plate fixation.  He tolerated this well  Consults: None  Significant Diagnostic Studies: None  Treatments: surgery: Anterolateral decompression L2-L3 lateral plate fixation Z8-H8  Discharge Exam: Blood pressure 125/71, pulse 84, temperature 98.3 F (36.8 C), temperature source Oral, resp. rate 18, height 6\' 4"  (1.93 m), weight 120.2 kg, SpO2 98 %. Incision is clean and dry and motor function is intact  Disposition: Discharge disposition: 01-Home or Self Care       Discharge Instructions    Call MD for:  redness, tenderness, or signs of infection (pain, swelling, redness, odor or green/yellow discharge around incision site)   Complete by:  As directed    Call MD for:  severe uncontrolled pain   Complete by:  As directed    Call MD for:  temperature >100.4   Complete by:  As directed    Diet - low sodium heart healthy   Complete by:  As directed    Discharge instructions   Complete by:  As directed    Okay to shower. Do not apply salves or appointments to incision. No heavy lifting with the upper extremities greater than 15 pounds. May resume driving when not requiring pain medication and patient feels comfortable with doing so.   Incentive spirometry RT   Complete by:  As  directed    Increase activity slowly   Complete by:  As directed      Allergies as of 12/27/2018      Reactions   Eggs Or Egg-derived Products Diarrhea, Nausea And Vomiting      Medication List    TAKE these medications   cholecalciferol 1000 units tablet Commonly known as:  VITAMIN D Take 1,000 Units by mouth daily.   furosemide 20 MG tablet Commonly known as:  LASIX Take 20 mg by mouth 2 (two) times daily.   HYDROcodone-acetaminophen 5-325 MG tablet Commonly known as:  NORCO/VICODIN Take 1 tablet by mouth every 6 (six) hours as needed.   ibuprofen 200 MG tablet Commonly known as:  ADVIL,MOTRIN Take 800 mg by mouth every 8 (eight) hours as needed for mild pain or moderate pain.   leflunomide 20 MG tablet Commonly known as:  ARAVA Take 1 tablet (20 mg total) by mouth daily.   methocarbamol 500 MG tablet Commonly known as:  ROBAXIN Take 1 tablet (500 mg total) by mouth every 6 (six) hours as needed for muscle spasms.   olmesartan 20 MG tablet Commonly known as:  BENICAR Take 20 mg by mouth at bedtime.   Orencia ClickJect 125 MG/ML Soaj Generic drug:  Abatacept INJECT 125 MG UNDER THE SKIN (SUBCUTANEOUS INJECTION) ONCE A WEEK.   oxyCODONE-acetaminophen 5-325 MG tablet Commonly known as:  PERCOCET/ROXICET Take 1-2 tablets by mouth every 3 (three) hours as needed for moderate pain or severe pain.   predniSONE 5 MG tablet Commonly known as:  DELTASONE Take 5 mg  by mouth daily with breakfast.   propranolol 10 MG tablet Commonly known as:  INDERAL Take 10 mg by mouth 3 (three) times daily.   traMADol 50 MG tablet Commonly known as:  ULTRAM Take 50 mg by mouth every 6 (six) hours as needed for moderate pain.        Signed: Shary KeyHenry J Pedrohenrique Hinton 12/27/2018, 1:56 PM

## 2018-12-27 NOTE — Evaluation (Addendum)
Occupational Therapy Evaluation Patient Details Name: Gregory Hinton MRN: [id number] DOB: 1966-08-11 Today's Date: 12/27/2018    History of Present Illness Pt is a 53 y/o male s/p L2-3 ALIF. PMH includes HTN, RA, and back surgery.    Clinical Impression   PTA, pt was living with his wife and was independent. Currently, pt performing ADLs and functional mobility at supervision level. Provided education and handout on back precautions, bed mobility, grooming, LB ADLs, toileting, and functional transfers; pt demonstrated understanding. Answered all pt questions. Recommend dc home once medically stable per physician. All acute OT needs met and will sign off. Thank you.     Follow Up Recommendations  No OT follow up    Equipment Recommendations  None recommended by OT    Recommendations for Other Services       Precautions / Restrictions Precautions Precautions: Back Precaution Booklet Issued: Yes (comment) Precaution Comments: Reviewed back precautions and pt verbalized and demonstrated understanding Required Braces or Orthoses: Spinal Brace Spinal Brace: Lumbar corset;Applied in sitting position Restrictions Weight Bearing Restrictions: No      Mobility Bed Mobility Overal bed mobility: Needs Assistance Bed Mobility: Rolling;Sidelying to Sit;Sit to Sidelying Rolling: Supervision Sidelying to sit: Supervision     Sit to sidelying: Supervision General bed mobility comments: Supervision for safety   Transfers Overall transfer level: Needs assistance Equipment used: None Transfers: Sit to/from Stand Sit to Stand: Supervision         General transfer comment: Supervision for safety. Demonstrating good technique    Balance Overall balance assessment: No apparent balance deficits (not formally assessed)                                         ADL either performed or assessed with clinical judgement   ADL Overall ADL's : Needs  assistance/impaired                                       General ADL Comments: Pt performing ADLs and functional transfers at supervision level. Provided education on compensatory techniques for grooming, bed mobility, LB ADLs, toileting, and functional transfers. Pt demonstrated and verablized understanding. Pt able to bring ankles to knees for figure four method     Vision         Perception     Praxis      Pertinent Vitals/Pain Pain Assessment: Faces Faces Pain Scale: Hurts a little bit Pain Location: back Pain Descriptors / Indicators: Operative site guarding;Aching Pain Intervention(s): Monitored during session;Repositioned;Premedicated before session     Hand Dominance Right   Extremity/Trunk Assessment Upper Extremity Assessment Upper Extremity Assessment: Overall WFL for tasks assessed   Lower Extremity Assessment Lower Extremity Assessment: Defer to PT evaluation   Cervical / Trunk Assessment Cervical / Trunk Assessment: Other exceptions Cervical / Trunk Exceptions: s/p lumbar surgery    Communication Communication Communication: No difficulties   Cognition Arousal/Alertness: Awake/alert Behavior During Therapy: WFL for tasks assessed/performed Overall Cognitive Status: Within Functional Limits for tasks assessed                                     General Comments  BP 133/89 on EOB    Exercises     Shoulder Instructions  Home Living Family/patient expects to be discharged to:: Private residence Living Arrangements: Spouse/significant other Available Help at Discharge: Family;Available 24 hours/day Type of Home: House Home Access: Ramped entrance     Home Layout: Multi-level;Able to live on main level with bedroom/bathroom     Bathroom Shower/Tub: Tub/shower unit;Walk-in shower   Bathroom Toilet: Standard     Home Equipment: Environmental consultant - 2 wheels;Bedside commode          Prior Functioning/Environment  Level of Independence: Independent        Comments: ADLs, IADLs, and working        OT Problem List: Decreased activity tolerance;Decreased range of motion;Decreased knowledge of use of DME or AE;Decreased knowledge of precautions;Pain      OT Treatment/Interventions:      OT Goals(Current goals can be found in the care plan section) Acute Rehab OT Goals Patient Stated Goal: to go home  OT Goal Formulation: All assessment and education complete, DC therapy  OT Frequency:     Barriers to D/C:            Co-evaluation              AM-PAC OT "6 Clicks" Daily Activity     Outcome Measure Help from another person eating meals?: None Help from another person taking care of personal grooming?: None Help from another person toileting, which includes using toliet, bedpan, or urinal?: None Help from another person bathing (including washing, rinsing, drying)?: None Help from another person to put on and taking off regular upper body clothing?: None Help from another person to put on and taking off regular lower body clothing?: None 6 Click Score: 24   End of Session Equipment Utilized During Treatment: Back brace Nurse Communication: Mobility status  Activity Tolerance: Patient tolerated treatment well Patient left: in bed;with call bell/phone within reach  OT Visit Diagnosis: Unsteadiness on feet (R26.81);Muscle weakness (generalized) (M62.81);Pain Pain - part of body: (Back)                Time: 4239-5320 OT Time Calculation (min): 18 min Charges:  OT General Charges $OT Visit: 1 Visit OT Evaluation $OT Eval Low Complexity: [place], OTR/L Acute Rehab Pager: [phone] Office: [place] 12/27/2018, 10:22 AM

## 2018-12-27 NOTE — Progress Notes (Signed)
Physical Therapy Treatment Patient Details Name: Gregory Hinton MRN: [id number] DOB: [birth year] Today's Date: 12/27/2018    History of Present Illness Pt is a 53 y/o male s/p L2-3 ALIF. PMH includes HTN, RA, and back surgery.     PT Comments    Pt pleasant, moving well and able to don brace without assist. Pt educated for all precautions with handout reviewed. Pt safe for mobilizing in room and for return home.     Follow Up Recommendations  No PT follow up;Supervision for mobility/OOB     Equipment Recommendations  None recommended by PT    Recommendations for Other Services       Precautions / Restrictions Precautions Precautions: Back Precaution Comments: Reviewed back precautions with pt. and handout Required Braces or Orthoses: Spinal Brace Spinal Brace: Lumbar corset;Applied in sitting position    Mobility  Bed Mobility Overal bed mobility: Needs Assistance Bed Mobility: Rolling;Sidelying to Sit Rolling: Supervision Sidelying to sit: Supervision       General bed mobility comments: cues for sequence with bed flat and no rail, increased time  Transfers Overall transfer level: Needs assistance   Transfers: Sit to/from Stand Sit to Stand: Modified independent (Device/Increase time)         General transfer comment: pt with one hand on bed and one on rW with good stability  Ambulation/Gait Ambulation/Gait assistance: Supervision Gait Distance (Feet): 500 Feet Assistive device: Rolling walker (2 wheeled) Gait Pattern/deviations: Step-through pattern;Decreased stride length   Gait velocity interpretation: >2.62 ft/sec, indicative of community ambulatory General Gait Details: pt with initial cues to step into RW for posture   Stairs             Wheelchair Mobility    Modified Rankin (Stroke Patients Only)       Balance Overall balance assessment: No apparent balance deficits (not formally assessed)                                           Cognition Arousal/Alertness: Awake/alert Behavior During Therapy: WFL for tasks assessed/performed Overall Cognitive Status: Within Functional Limits for tasks assessed                                        Exercises      General Comments        Pertinent Vitals/Pain Faces Pain Scale: Hurts a little bit Pain Location: back Pain Descriptors / Indicators: Operative site guarding;Aching Pain Intervention(s): Limited activity within patient's tolerance    Home Living                      Prior Function            PT Goals (current goals can now be found in the care plan section) Progress towards PT goals: Progressing toward goals    Frequency    Min 5X/week      PT Plan Current plan remains appropriate    Co-evaluation              AM-PAC PT "6 Clicks" Mobility   Outcome Measure  Help needed turning from your back to your side while in a flat bed without using bedrails?: None Help needed moving from lying on your back to sitting on the side of a flat  bed without using bedrails?: None Help needed moving to and from a bed to a chair (including a wheelchair)?: None Help needed standing up from a chair using your arms (e.g., wheelchair or bedside chair)?: None Help needed to walk in hospital room?: A Little Help needed climbing 3-5 steps with a railing? : A Little 6 Click Score: 22    End of Session Equipment Utilized During Treatment: Back brace Activity Tolerance: Patient tolerated treatment well Patient left: in chair;with call bell/phone within reach Nurse Communication: Mobility status PT Visit Diagnosis: Other abnormalities of gait and mobility (R26.89);Pain     Time: 0713-0733 PT Time Calculation (min) (ACUTE ONLY): 20 min  Charges:  $Gait Training: 8-22 mins                     Josephine Wooldridge Abner Greenspanabor Aahna Rossa, PT Acute Rehabilitation Services Pager: [phone] Office: [phone]    Obi Scrima B  Medina Degraffenreid 12/27/2018, 8:25 AM

## 2018-12-27 NOTE — Progress Notes (Signed)
Patient ID: Gregory Hinton, male   DOB: 1965/11/11, 53 y.o.   MRN: [id number] Signs are stable Motor function appears intact Dressings are clean and dry For discharge today

## 2018-12-28 NOTE — Anesthesia Postprocedure Evaluation (Signed)
Anesthesia Post Note  Patient: RANJEET VANNEST  Procedure(s) Performed: Left Lumbar Two-Three Anterolateral decompression/interbody fusion/lateral plate (Left Spine Lumbar)     Patient location during evaluation: PACU Anesthesia Type: General Level of consciousness: awake and alert Pain management: pain level controlled Vital Signs Assessment: post-procedure vital signs reviewed and stable Respiratory status: spontaneous breathing, nonlabored ventilation, respiratory function stable and patient connected to nasal cannula oxygen Cardiovascular status: blood pressure returned to baseline and stable Postop Assessment: no apparent nausea or vomiting Anesthetic complications: no    Last Vitals:  Vitals:   12/27/18 0400 12/27/18 1200  BP: 126/80 125/71  Pulse:  84  Resp:  18  Temp: 36.8 C 36.8 C  SpO2:  98%    Last Pain:  Vitals:   12/27/18 1200  TempSrc: Oral  PainSc: 7    Pain Goal:                   Tenika Keeran S

## 2019-01-02 ENCOUNTER — Telehealth: Payer: Self-pay | Admitting: Rheumatology

## 2019-01-02 NOTE — Telephone Encounter (Signed)
Patient left a voicemail stating he had back surgery last week and is requesting a return call to let him know when he should start back on his Orencia injection.

## 2019-01-02 NOTE — Telephone Encounter (Signed)
Patient advised he will need clearance from his surgeon in order to restart the Orencia. Patient advised he needs to cleared from infection. Patient verbalized understanding.

## 2019-01-24 ENCOUNTER — Other Ambulatory Visit: Payer: Self-pay | Admitting: Rheumatology

## 2019-01-30 ENCOUNTER — Telehealth: Payer: Self-pay | Admitting: Rheumatology

## 2019-01-30 MED ORDER — ABATACEPT 125 MG/ML ~~LOC~~ SOAJ: 125.0000 mg | SUBCUTANEOUS | 0 refills | Status: DC

## 2019-01-30 NOTE — Telephone Encounter (Signed)
Called patient to confirm medication and patient would like refill of orencia. Patient states he was cleared from his surgeon several weeks ago to resume medications.   Last Visit: 09/20/2018 Next Visit: 02/21/2019 Labs: 12/20/2018 CBC, BMP  TB Gold: 06/10/2018 negative   Okay to refill per Dr. Corliss Skains.

## 2019-01-30 NOTE — Telephone Encounter (Signed)
Patient called requesting prescription refill of Cosentyx to be sent to [facility].

## 2019-02-14 NOTE — Progress Notes (Signed)
Office Visit Note  Patient: Gregory Hinton             Date of Birth: March 16, 1966           MRN: [id number]             PCP: [facility] Referring: [place] He* Visit Date: 02/21/2019 Occupation: @GUAROCC @  Subjective:  Pain in both hands   History of Present Illness: Gregory Hinton is a 53 y.o. male with history of seronegative rheumatoid arthritis, osteoarthritis, and DDD.  He reports he had a lumbar fusion performed by Dr. Ellene Route on 12/26/18.  He had no complications.  He was off of [place] for 4 weeks.  He restarted about 1 month ago.  He had a mild flare while he is off of his medications in both hands and the right ankle joint.  He denies any joint swelling at this time but continues to have some discomfort.  He continues to take prednisone 5 mg by mouth daily for adrenal insufficiency.    Activities of Daily Living:  Patient reports morning stiffness for 1-2 hours.   Patient Denies nocturnal pain.  Difficulty dressing/grooming: Denies Difficulty climbing stairs: Denies Difficulty getting out of chair: Reports Difficulty using hands for taps, buttons, cutlery, and/or writing: Reports  Review of Systems  Constitutional: Positive for fatigue.  HENT: Negative for mouth sores, mouth dryness and nose dryness.   Eyes: Negative for pain, itching and dryness.  Respiratory: Negative for shortness of breath, wheezing and difficulty breathing.   Cardiovascular: Negative for chest pain, palpitations and swelling in legs/feet.  Gastrointestinal: Positive for diarrhea. Negative for abdominal pain, blood in stool and constipation.  Endocrine: Negative for increased urination.  Genitourinary: Negative for painful urination.  Musculoskeletal: Positive for arthralgias, joint pain and morning stiffness. Negative for joint swelling.  Skin: Negative for rash and redness.  Allergic/Immunologic: Negative for susceptible to infections.   Neurological: Positive for weakness. Negative for dizziness, headaches and memory loss.  Hematological: Positive for bruising/bleeding tendency.  Psychiatric/Behavioral: Negative for confusion and sleep disturbance. The patient is not nervous/anxious.     PMFS History:  Patient Active Problem List   Diagnosis Date Noted  . High risk medication use 04/19/2018  . History of diverticulitis 05/04/2017  . History of colon polyps 05/04/2017  . Primary osteoarthritis of both hands 05/04/2017  . Primary osteoarthritis of both feet 05/04/2017  . Primary osteoarthritis of both knees 04/02/2017  . Class 1 obesity without serious comorbidity with body mass index (BMI) of 32.0 to 32.9 in adult 10/14/2016  . Spondylosis of lumbar region without myelopathy or radiculopathy 10/14/2016  . DJD (degenerative joint disease), cervical 10/14/2016  . High risk medications (not anticoagulants) long-term use 10/14/2016  . Rheumatoid arthritis, seronegative, multiple sites ([place]) 10/14/2016  . Lumbar stenosis with neurogenic claudication 12/17/2015    Past Medical History:  Diagnosis Date  . Adrenal insufficiency ([place])   . Anxiety   . Arthritis   . Back pain    had 3 fusions surgeries  . H/O cardiovascular stress test 2014   admitted at [facility]. overnight for chest pain, stress/echo test wnl  . Hypertension   . PONV (postoperative nausea and vomiting)   . Rheumatoid arthritis ([place])    manages by Dr. Estanislado Pandy, rec'd Orencia weekly for 2 months, switched from Humira     Family History  Problem Relation Age of Onset  . Aneurysm Mother   . Leukemia Father   .  Healthy Daughter   . Healthy Son    Past Surgical History:  Procedure Laterality Date  . ANTERIOR LAT LUMBAR FUSION Left 12/26/2018   Procedure: Left Lumbar Two-Three Anterolateral decompression/interbody fusion/lateral plate;  Surgeon: Kristeen Miss, MD;  Location: [facility];  Service: Neurosurgery;  Laterality: Left;  Anterolateral  . BACK SURGERY   [id number]   lumbar fusion 3 times  . CARPAL TUNNEL RELEASE Right 2000   and left side 2 years ago with left ulnar surgery  . COLONOSCOPY    . HERNIA REPAIR Right 2001   inguinal    Social History   Social History Narrative  . Not on file   Immunization History  Administered Date(s) Administered  . Influenza-Unspecified 05/05/2014  . Pneumococcal Polysaccharide-23 04/25/2014  . Zoster 04/25/2014     Objective: Vital Signs: BP 135/88 (BP Location: Left Arm, Patient Position: Sitting, Cuff Size: Normal)   Pulse 75   Resp 16   Ht 6' 4"  (1.93 m)   Wt 273 lb (123.8 kg)   BMI 33.23 kg/m    Physical Exam Vitals signs and nursing note reviewed.  Constitutional:      Appearance: He is well-developed.  HENT:     Head: Normocephalic and atraumatic.  Eyes:     Conjunctiva/sclera: Conjunctivae normal.     Pupils: Pupils are equal, round, and reactive to light.  Neck:     Musculoskeletal: Normal range of motion and neck supple.  Cardiovascular:     Rate and Rhythm: Normal rate and regular rhythm.     Heart sounds: Normal heart sounds.  Pulmonary:     Effort: Pulmonary effort is normal.     Breath sounds: Normal breath sounds.  Abdominal:     General: Bowel sounds are normal.     Palpations: Abdomen is soft.  Lymphadenopathy:     Cervical: No cervical adenopathy.  Skin:    General: Skin is warm and dry.     Capillary Refill: Capillary refill takes less than 2 seconds.  Neurological:     Mental Status: He is alert and oriented to person, place, and time.  Psychiatric:        Behavior: Behavior normal.      Musculoskeletal Exam:C-spine good ROM.  Limited ROM of lumbar spine-s/p lumbar fusion.  Limited abduction of both shoulder joints to 90 degrees bilaterally. Limited ROM of wrist joints, especially limited flexion of left wrist joint.  No tenderness or synovitis of wrist joints.  MCPs, PIPs, and DIPs good ROM with no synovitis.  PIP and DIP synovial thickening consistent  with osteoarthritis.  Hip joints, knee joints, ankle joints, MTPs, PIPs, and DIPs good ROM with no synovitis.  No warmth or effusion of knee joints.  No tenderness or swelling of ankle joints.    CDAI Exam: CDAI Score: Not documented Patient Global Assessment: Not documented; Provider Global Assessment: Not documented Swollen: Not documented; Tender: Not documented Joint Exam   Not documented   There is currently no information documented on the homunculus. Go to the Rheumatology activity and complete the homunculus joint exam.  Investigation: No additional findings.  Imaging: No results found.  Recent Labs: Lab Results  Component Value Date   WBC 12.3 (H) 12/20/2018   HGB 14.7 12/20/2018   PLT 253 12/20/2018   NA 139 12/20/2018   K 3.9 12/20/2018   CL 107 12/20/2018   CO2 23 12/20/2018   GLUCOSE 96 12/20/2018   BUN 19 12/20/2018   CREATININE 0.99 12/20/2018   BILITOT  0.5 06/10/2018   ALKPHOS 28 (L) 11/24/2017   AST 20 06/10/2018   ALT 43 06/10/2018   PROT 6.3 06/10/2018   ALBUMIN 3.0 (L) 11/24/2017   CALCIUM 9.3 12/20/2018   GFRAA >60 12/20/2018   QFTBGOLDPLUS NEGATIVE 06/10/2018    Speciality Comments: Prior therapy:  MTX, PLQ, Enbrel, and Humira (inadequate response). Due to his history of diverticulitis, [facility] is not an option.  Procedures:  No procedures performed Allergies: Eggs or egg-derived products   Assessment / Plan:     Visit Diagnoses: Rheumatoid arthritis, seronegative, multiple sites [facility]): He has no synovitis on exam.  He had a lumbar fusion on 12/26/18 performed by Dr. Ellene Route, and he was off of [place] for 1 month.  He had a mild flare in both hands and the right ankle joint during that time.  He continues to have pain in both hands but no joint swelling.  He has tenderness of the right ankle joint but no warmth or effusion.  He resumed Orencia 125 mg sq weekly injections and Arava 20 mg po daily about 1 month ago.  He takes prednisone 5  mg po daily for adrenal insufficiency.  He will continue on this current treatment regimen.  He does not need any refills at this time.  He was advised to notify us if he develops increased joint pain or joint swelling.  He will follow up in 5 months.   High risk medication use - Orencia 125 mg sq weekly injection and Arava 20 mg po daily (failed MTX, PLQ, Enbrel, and Humira in the past.) Last TB gold negative on 06/10/2018 and will monitor yearly.  Most recent CBC showed elevated WBC on 12/20/2018.  Most recent CMP within normal limits except for borderline low alk phos on 06/10/2018.  Due for CBC/CMP today and will monitor every 3 months.  Standing orders are in place.  He has received Pneumovax 23 and zoster vaccine.  - Plan: CBC with Differential/Platelet, COMPLETE METABOLIC PANEL WITH GFR  Primary osteoarthritis of both hands: He has PIP and DIP synovial thickening consistent with osteoarthritis.  He has no synovitis or dactylitis on exam. He has complete fist formation bilaterally.  He has chronic pain in both hands.  He performs overuse activities at work.  Joint protection and muscle strengthening were discussed.   Primary osteoarthritis of both knees: No warmth or effusion.  He has good ROM with no discomfort.  He has no difficulty getting up from a chair or climbing steps.   Primary osteoarthritis of both feet: He has no feet pain or joint swelling at this time.  He has mild tenderness of the right ankle joint but no warmth or effusion noted.   DDD (degenerative disc disease), cervical: He has good ROM with no discomfort.  He has no symptoms of radiculopathy at this time.    DDD (degenerative disc disease), lumbar: He has limited ROM. He had a lumbar fusion on 12/26/18 performed by Dr. Ellene Route.  He had no complications.  His symptoms of radiculopathy have resolved.  He was cleared by Dr. Ellene Route 2 weeks after surgery to restart on Orencia and Arava.   Spondylosis of lumbar region without myelopathy  or radiculopathy: He has lumbar fusion on 12/26/18 by Dr. Ellene Route.    Other medical conditions are listed as follows:   History of colon polyps  History of diverticulitis   Orders: Orders Placed This Encounter  Procedures  . CBC with Differential/Platelet  . COMPLETE METABOLIC PANEL WITH  GFR  . QuantiFERON-TB Gold Plus   No orders of the defined types were placed in this encounter.   Face-to-face time spent with patient was 15 minutes. Greater than 50% of time was spent in counseling and coordination of care.  Follow-Up Instructions: Return in about 5 months (around 07/24/2019) for Rheumatoid arthritis.   Ofilia Neas, PA-C  Note - This record has been created using Dragon software.  Chart creation errors have been sought, but may not always  have been located. Such creation errors do not reflect on  the standard of medical care.

## 2019-02-21 ENCOUNTER — Encounter: Payer: Self-pay | Admitting: Physician Assistant

## 2019-02-21 ENCOUNTER — Ambulatory Visit (INDEPENDENT_AMBULATORY_CARE_PROVIDER_SITE_OTHER): Payer: BLUE CROSS/BLUE SHIELD | Admitting: Physician Assistant

## 2019-02-21 ENCOUNTER — Other Ambulatory Visit: Payer: Self-pay

## 2019-02-21 VITALS — BP 135/88 | HR 75 | Resp 16 | Ht 76.0 in | Wt 273.0 lb

## 2019-02-21 DIAGNOSIS — Z79899 Other long term (current) drug therapy: Secondary | ICD-10-CM

## 2019-02-21 DIAGNOSIS — Z8719 Personal history of other diseases of the digestive system: Secondary | ICD-10-CM

## 2019-02-21 DIAGNOSIS — M19042 Primary osteoarthritis, left hand: Secondary | ICD-10-CM

## 2019-02-21 DIAGNOSIS — M0609 Rheumatoid arthritis without rheumatoid factor, multiple sites: Secondary | ICD-10-CM | POA: Diagnosis not present

## 2019-02-21 DIAGNOSIS — M170 Bilateral primary osteoarthritis of knee: Secondary | ICD-10-CM

## 2019-02-21 DIAGNOSIS — M19071 Primary osteoarthritis, right ankle and foot: Secondary | ICD-10-CM

## 2019-02-21 DIAGNOSIS — M19041 Primary osteoarthritis, right hand: Secondary | ICD-10-CM | POA: Diagnosis not present

## 2019-02-21 DIAGNOSIS — M5030 Other cervical disc degeneration, unspecified cervical region: Secondary | ICD-10-CM

## 2019-02-21 DIAGNOSIS — Z86010 Personal history of colonic polyps: Secondary | ICD-10-CM

## 2019-02-21 DIAGNOSIS — M5136 Other intervertebral disc degeneration, lumbar region: Secondary | ICD-10-CM

## 2019-02-21 DIAGNOSIS — M19072 Primary osteoarthritis, left ankle and foot: Secondary | ICD-10-CM

## 2019-02-21 DIAGNOSIS — M47816 Spondylosis without myelopathy or radiculopathy, lumbar region: Secondary | ICD-10-CM

## 2019-02-22 LAB — CBC WITH DIFFERENTIAL/PLATELET
Absolute Monocytes: 611 cells/uL (ref 200–950)
Basophils Absolute: 38 cells/uL (ref 0–200)
Basophils Relative: 0.4 %
Eosinophils Absolute: 103 cells/uL (ref 15–500)
Eosinophils Relative: 1.1 %
HCT: 45.1 % (ref 38.5–50.0)
Hemoglobin: 15 g/dL (ref 13.2–17.1)
Lymphs Abs: 1711 cells/uL (ref 850–3900)
MCH: 28.8 pg (ref 27.0–33.0)
MCHC: 33.3 g/dL (ref 32.0–36.0)
MCV: 86.7 fL (ref 80.0–100.0)
MPV: 12.1 fL (ref 7.5–12.5)
Monocytes Relative: 6.5 %
Neutro Abs: 6937 cells/uL (ref 1500–7800)
Neutrophils Relative %: 73.8 %
Platelets: 257 10*3/uL (ref 140–400)
RBC: 5.2 10*6/uL (ref 4.20–5.80)
RDW: 13.3 % (ref 11.0–15.0)
Total Lymphocyte: 18.2 %
WBC: 9.4 10*3/uL (ref 3.8–10.8)

## 2019-02-22 LAB — COMPLETE METABOLIC PANEL WITH GFR
AG Ratio: 1.8 (calc) (ref 1.0–2.5)
ALT: 25 U/L (ref 9–46)
AST: 17 U/L (ref 10–35)
Albumin: 4.4 g/dL (ref 3.6–5.1)
Alkaline phosphatase (APISO): 68 U/L (ref 35–144)
BUN: 15 mg/dL (ref 7–25)
CO2: 28 mmol/L (ref 20–32)
Calcium: 9.9 mg/dL (ref 8.6–10.3)
Chloride: 103 mmol/L (ref 98–110)
Creat: 0.93 mg/dL (ref 0.70–1.33)
GFR, Est African American: 107 mL/min/{1.73_m2} (ref >=60)
GFR, Est Non African American: 93 mL/min/{1.73_m2} (ref >=60)
Globulin: 2.4 g/dL (calc) (ref 1.9–3.7)
Glucose, Bld: 97 mg/dL (ref 65–99)
Potassium: 4.2 mmol/L (ref 3.5–5.3)
Sodium: 142 mmol/L (ref 135–146)
Total Bilirubin: 0.3 mg/dL (ref 0.2–1.2)
Total Protein: 6.8 g/dL (ref 6.1–8.1)

## 2019-02-22 NOTE — Progress Notes (Signed)
CBC and CMP WNL

## 2019-04-12 ENCOUNTER — Other Ambulatory Visit: Payer: Self-pay | Admitting: *Deleted

## 2019-04-12 MED ORDER — ORENCIA CLICKJECT 125 MG/ML ~~LOC~~ SOAJ: 125.0000 mg | SUBCUTANEOUS | 0 refills | Status: DC

## 2019-04-12 NOTE — Telephone Encounter (Signed)
Refill request received via fax  Last Visit: 02/21/19 Next Visit: 06/23/19 Labs: 02/21/19 WNL TB Gold: 06/10/18 Neg   Okay to refill per Dr. Estanislado Pandy

## 2019-07-11 ENCOUNTER — Other Ambulatory Visit: Payer: Self-pay | Admitting: Rheumatology

## 2019-07-11 NOTE — Progress Notes (Signed)
Office Visit Note  Patient: Gregory Hinton             Date of Birth: 1965-12-09           MRN: [id number]             PCP: [facility] Referring: [facility]* Visit Date: 07/12/2019 Occupation: @GUAROCC @  Subjective:  Pain in both shoulder joints   History of Present Illness: Gregory Hinton is a 53 y.o. male with history of seronegative rheumatoid arthritis, osteoarthritis, and DDD. He is on Orencia 125 mg sq weekly injections and Arava 20 mg po daily.  He has not missed any doses of his medications recently.  He has no longer taking tramadol.  He is taking prednisone 5 mg po daily.  Reports he has been clinically doing well over the past several months but has been experiencing increasing fatigue and arthralgias for the past 2 weeks.  He states he is having pain in bilateral shoulder joints especially the left shoulder.  He has noticed some limited range of motion as well as nocturnal pain.  He continues to have chronic pain in both hands but denies any obvious joint swelling.  He has chronic lower back pain.   Activities of Daily Living:  Patient reports morning stiffness for several hours.   Patient Reports nocturnal pain.  Difficulty dressing/grooming: Denies Difficulty climbing stairs: Denies Difficulty getting out of chair: Denies Difficulty using hands for taps, buttons, cutlery, and/or writing: Reports  Review of Systems  Constitutional: Positive for fatigue. Negative for night sweats.  HENT: Negative for mouth sores, mouth dryness and nose dryness.   Eyes: Negative for redness, itching, visual disturbance and dryness.  Respiratory: Negative for cough, hemoptysis, shortness of breath, wheezing and difficulty breathing.   Cardiovascular: Negative for chest pain, palpitations, hypertension, irregular heartbeat and swelling in legs/feet.  Gastrointestinal: Positive for blood in stool, constipation and diarrhea.  Endocrine: Negative for  increased urination.  Genitourinary: Negative for difficulty urinating and painful urination.  Musculoskeletal: Positive for arthralgias, joint pain, joint swelling and morning stiffness. Negative for myalgias, muscle weakness, muscle tenderness and myalgias.  Skin: Negative for color change, rash, hair loss, nodules/bumps, skin tightness, ulcers and sensitivity to sunlight.  Allergic/Immunologic: Negative for susceptible to infections.  Neurological: Positive for headaches. Negative for dizziness, fainting, light-headedness, numbness, memory loss and night sweats.  Hematological: Negative for swollen glands.  Psychiatric/Behavioral: Positive for sleep disturbance. Negative for depressed mood and confusion. The patient is not nervous/anxious.     PMFS History:  Patient Active Problem List   Diagnosis Date Noted   High risk medication use 04/19/2018   History of diverticulitis 05/04/2017   History of colon polyps 05/04/2017   Primary osteoarthritis of both hands 05/04/2017   Primary osteoarthritis of both feet 05/04/2017   Primary osteoarthritis of both knees 04/02/2017   Class 1 obesity without serious comorbidity with body mass index (BMI) of 32.0 to 32.9 in adult 10/14/2016   Spondylosis of lumbar region without myelopathy or radiculopathy 10/14/2016   DJD (degenerative joint disease), cervical 10/14/2016   High risk medications (not anticoagulants) long-term use 10/14/2016   Rheumatoid arthritis, seronegative, multiple sites ([facility]) 10/14/2016   Lumbar stenosis with neurogenic claudication 12/17/2015    Past Medical History:  Diagnosis Date   Adrenal insufficiency ([facility])    Anxiety    Arthritis    Back pain    had 3 fusions surgeries   H/O cardiovascular stress test 2014  admitted at [facility]. overnight for chest pain, stress/echo test wnl   Hypertension    PONV (postoperative nausea and vomiting)    Rheumatoid arthritis ([facility])    manages by Dr. Estanislado Pandy, rec'd  Orencia weekly for 2 months, switched from Humira     Family History  Problem Relation Age of Onset   Aneurysm Mother    Leukemia Father    Healthy Daughter    Healthy Son    Past Surgical History:  Procedure Laterality Date   ANTERIOR LAT LUMBAR FUSION Left 12/26/2018   Procedure: Left Lumbar Two-Three Anterolateral decompression/interbody fusion/lateral plate;  Surgeon: Kristeen Miss, MD;  Location: [facility];  Service: Neurosurgery;  Laterality: Left;  Anterolateral   BACK SURGERY  [phone]   lumbar fusion 3 times   CARPAL TUNNEL RELEASE Right 2000   and left side 2 years ago with left ulnar surgery   COLONOSCOPY     HERNIA REPAIR Right 2001   inguinal    Social History   Social History Narrative   Not on file   Immunization History  Administered Date(s) Administered   Influenza-Unspecified 05/05/2014   Pneumococcal Polysaccharide-23 04/25/2014   Zoster 04/25/2014     Objective: Vital Signs: BP (!) 155/101 (BP Location: Left Arm, Patient Position: Sitting, Cuff Size: Normal)    Pulse 87    Resp 15    Ht 6\' 4"  (1.93 m)    Wt 273 lb 6.4 oz (124 kg)    BMI 33.28 kg/m    Physical Exam Vitals signs and nursing note reviewed.  Constitutional:      Appearance: He is well-developed.  HENT:     Head: Normocephalic and atraumatic.  Eyes:     Conjunctiva/sclera: Conjunctivae normal.     Pupils: Pupils are equal, round, and reactive to light.  Neck:     Musculoskeletal: Normal range of motion and neck supple.  Cardiovascular:     Rate and Rhythm: Normal rate and regular rhythm.     Heart sounds: Normal heart sounds.  Pulmonary:     Effort: Pulmonary effort is normal.     Breath sounds: Normal breath sounds.  Abdominal:     General: Bowel sounds are normal.     Palpations: Abdomen is soft.  Skin:    General: Skin is warm and dry.     Capillary Refill: Capillary refill takes less than 2 seconds.  Neurological:     Mental Status: He is alert and oriented to  person, place, and time.  Psychiatric:        Behavior: Behavior normal.      Musculoskeletal Exam: C-spine, thoracic spine, lumbar spine limited range of motion.  Midline spinal tenderness in lumbar region.  Right shoulder has good range of motion with discomfort.  Left shoulder abduction to about 120 degrees.  Elbow joints, wrist joints, MCPs, PIPs, DIPs good range of motion no synovitis.  He has tenderness of bilateral fifth PIP joints.  He has tenderness of the right second MCP joint.  Hip joints have good range of motion with no discomfort.  Knee joints have good range of motion with no warmth or effusion.  He has no tenderness or swelling of ankle joints.  CDAI Exam: CDAI Score: 6.2  Patient Global: 6 mm; Provider Global: 6 mm Swollen: 0 ; Tender: 5  Joint Exam      Right  Left  Glenohumeral   Tender   Tender  MCP 2   Tender     PIP 5  Tender   Tender   There is currently no information documented on the homunculus. Go to the Rheumatology activity and complete the homunculus joint exam.  Investigation: No additional findings.  Imaging: No results found.  Recent Labs: Lab Results  Component Value Date   WBC 9.4 02/21/2019   HGB 15.0 02/21/2019   PLT 257 02/21/2019   NA 142 02/21/2019   K 4.2 02/21/2019   CL 103 02/21/2019   CO2 28 02/21/2019   GLUCOSE 97 02/21/2019   BUN 15 02/21/2019   CREATININE 0.93 02/21/2019   BILITOT 0.3 02/21/2019   ALKPHOS 28 (L) 11/24/2017   AST 17 02/21/2019   ALT 25 02/21/2019   PROT 6.8 02/21/2019   ALBUMIN 3.0 (L) 11/24/2017   CALCIUM 9.9 02/21/2019   GFRAA 107 02/21/2019   QFTBGOLDPLUS NEGATIVE 06/10/2018    Speciality Comments: Prior therapy:  MTX, PLQ, Enbrel, and Humira (inadequate response). Due to his history of diverticulitis, [facility] is not an option.  Procedures:  No procedures performed Allergies: Eggs or egg-derived products   Assessment / Plan:     Visit Diagnoses: Rheumatoid arthritis, seronegative, multiple  sites [facility]): He has no synovitis on exam.  He presents today with increased pain in both hands and both shoulder joints started by 2 weeks ago.  He has tenderness of the right second MCP and bilateral fifth PIP joints.  He has complete fist formation bilaterally.  He has PIP and DIP synovial thickening consistent with osteoarthritis of both hands.  He has limited abduction of the left shoulder to about 120 degrees.  He declined x-rays of both hands and both shoulder joints today.  He is given a handout of hand exercises and shoulder joint exercises to perform.  Overall he is clinically doing well on Orencia 125 mg subcutaneous injections once weekly and Arava 20 mg by mouth daily.  He will continue on his current treatment regimen.  A refill of Orencia will be sent to the pharmacy.  We will update lab work today.  He was advised to notify us if he develops increased joint pain or joint swelling.  He will follow-up in the office in 3 months  High risk medication use - Orencia 125 mg sq weekly injection and Arava 20 mg po daily (failed MTX, PLQ, Enbrel, and Humira in the past.)  CBC, CMP, and TB gold will be updated today.  He will return for lab work in January and every 3 months to monitor for drug toxicity.- Plan: COMPLETE METABOLIC PANEL WITH GFR, CBC with Differential/Platelet, QuantiFERON-TB Gold Plus  Primary osteoarthritis of both hands: He has PIP and DIP synovial thickening consistent with osteoarthritis of both hands.  He has tenderness of bilateral fifth PIP joints.  He has been experiencing increased pain in both hands.  He has morning stiffness lasting several hours.  He has noticed decreased grip strength bilaterally.  Joint protection and muscle strengthening were discussed.  He was given a handout of hand exercises to perform.  Chronic pain of both shoulders: He has been experiencing increased pain in both shoulders for the past 2 to 3 weeks.  Right shoulder has full range of motion with some  discomfort.  Left shoulder abduction to about 120 degrees.  No warmth or effusion of shoulder joints noted.  He declined x-rays of both shoulders at this time.  He was given a handout of shoulder exercises to perform.  We discussed the importance of working on strengthening and range of motion.  He was  advised to notify us if his symptoms persist or worsen.  Primary osteoarthritis of both knees: He has good range of motion with no discomfort.  No warmth or effusion was noted.  Primary osteoarthritis of both feet: He has chronic pain in both feet.  No joint swelling.  He wears proper fitting shoes.  DDD (degenerative disc disease), cervical: He has limited range of motion with some discomfort.  He has no symptoms of radiculopathy at this time.  DDD (degenerative disc disease), lumbar: Chronic pain.  He is limited range of motion with discomfort.  He had a lumbar spinal fusion performed by Dr. Danielle DessElsner on 12/26/2018.  He has no longer taking tramadol.  Spondylosis of lumbar region without myelopathy or radiculopathy - lumbar fusion on 12/26/18 performed by Dr. Danielle DessElsner.  Chronic pain.  He is no longer taking tramadol.  Other medical conditions are listed as follows:   History of colon polyps  History of diverticulitis  Orders: Orders Placed This Encounter  Procedures   COMPLETE METABOLIC PANEL WITH GFR   CBC with Differential/Platelet   QuantiFERON-TB Gold Plus   No orders of the defined types were placed in this encounter.     Follow-Up Instructions: Return in about 3 months (around 10/12/2019) for Rheumatoid arthritis, Osteoarthritis, DDD.   Gearldine Bienenstockaylor M Arlissa Monteverde, PA-C  Note - This record has been created using Dragon software.  Chart creation errors have been sought, but may not always  have been located. Such creation errors do not reflect on  the standard of medical care.

## 2019-07-11 NOTE — Telephone Encounter (Signed)
Last Visit: 02/21/19 Next Visit: 07/12/19 Labs: 02/21/19 WNL TB Gold: 06/10/18 Neg   Patient to update labs at his appointment on 07/12/19   Okay to refill 30 day supply per Dr. Estanislado Pandy

## 2019-07-12 ENCOUNTER — Encounter: Payer: Self-pay | Admitting: Physician Assistant

## 2019-07-12 ENCOUNTER — Other Ambulatory Visit: Payer: Self-pay

## 2019-07-12 ENCOUNTER — Ambulatory Visit (INDEPENDENT_AMBULATORY_CARE_PROVIDER_SITE_OTHER): Payer: BC Managed Care – PPO | Admitting: Physician Assistant

## 2019-07-12 VITALS — BP 155/101 | HR 87 | Resp 15 | Ht 76.0 in | Wt 273.4 lb

## 2019-07-12 DIAGNOSIS — M170 Bilateral primary osteoarthritis of knee: Secondary | ICD-10-CM

## 2019-07-12 DIAGNOSIS — M0609 Rheumatoid arthritis without rheumatoid factor, multiple sites: Secondary | ICD-10-CM

## 2019-07-12 DIAGNOSIS — M19041 Primary osteoarthritis, right hand: Secondary | ICD-10-CM | POA: Diagnosis not present

## 2019-07-12 DIAGNOSIS — Z79899 Other long term (current) drug therapy: Secondary | ICD-10-CM

## 2019-07-12 DIAGNOSIS — G8929 Other chronic pain: Secondary | ICD-10-CM

## 2019-07-12 DIAGNOSIS — M47816 Spondylosis without myelopathy or radiculopathy, lumbar region: Secondary | ICD-10-CM

## 2019-07-12 DIAGNOSIS — Z86010 Personal history of colonic polyps: Secondary | ICD-10-CM

## 2019-07-12 DIAGNOSIS — M19042 Primary osteoarthritis, left hand: Secondary | ICD-10-CM

## 2019-07-12 DIAGNOSIS — M25511 Pain in right shoulder: Secondary | ICD-10-CM

## 2019-07-12 DIAGNOSIS — M5136 Other intervertebral disc degeneration, lumbar region: Secondary | ICD-10-CM

## 2019-07-12 DIAGNOSIS — M19072 Primary osteoarthritis, left ankle and foot: Secondary | ICD-10-CM

## 2019-07-12 DIAGNOSIS — Z8719 Personal history of other diseases of the digestive system: Secondary | ICD-10-CM

## 2019-07-12 DIAGNOSIS — M5030 Other cervical disc degeneration, unspecified cervical region: Secondary | ICD-10-CM

## 2019-07-12 DIAGNOSIS — M19071 Primary osteoarthritis, right ankle and foot: Secondary | ICD-10-CM

## 2019-07-12 DIAGNOSIS — M25512 Pain in left shoulder: Secondary | ICD-10-CM

## 2019-07-12 MED ORDER — ORENCIA CLICKJECT 125 MG/ML ~~LOC~~ SOAJ: 125.0000 mg | SUBCUTANEOUS | 0 refills | Status: DC

## 2019-07-12 NOTE — Patient Instructions (Addendum)
Standing Labs We placed an order today for your standing lab work.    Please come back and get your standing labs in January and every 3 months   We have open lab daily Monday through Thursday from 8:30-12:30 PM and 1:30-4:30 PM and Friday from 8:30-12:30 PM and 1:30-4:00 PM at the office of Dr. Pollyann Savoy.   You may experience shorter wait times on Monday and Friday afternoons. The office is located at [address], [place], [place] [address] No appointment is necessary.   Labs are drawn by [facility].  You may receive a bill from [facility] for your lab work.  If you wish to have your labs drawn at another location, please call the office 24 hours in advance to send orders.  If you have any questions regarding directions or hours of operation,  please call [phone].   Just as a reminder please drink plenty of water prior to coming for your lab work. Thanks!   Shoulder Exercises Ask your health care provider which exercises are safe for you. Do exercises exactly as told by your health care provider and adjust them as directed. It is normal to feel mild stretching, pulling, tightness, or discomfort as you do these exercises. Stop right away if you feel sudden pain or your pain gets worse. Do not begin these exercises until told by your health care provider. Stretching exercises External rotation and abduction This exercise is sometimes called corner stretch. This exercise rotates your arm outward (external rotation) and moves your arm out from your body (abduction). 1. Stand in a doorway with one of your feet slightly in front of the other. This is called a staggered stance. If you cannot reach your forearms to the door frame, stand facing a corner of a room. 2. Choose one of the following positions as told by your health care provider: ? Place your hands and forearms on the door frame above your head. ? Place your hands and forearms on the door frame at the height of your  head. ? Place your hands on the door frame at the height of your elbows. 3. Slowly move your weight onto your front foot until you feel a stretch across your chest and in the front of your shoulders. Keep your head and chest upright and keep your abdominal muscles tight. 4. Hold for __________ seconds. 5. To release the stretch, shift your weight to your back foot. Repeat __________ times. Complete this exercise __________ times a day. Extension, standing 1. Stand and hold a broomstick, a cane, or a similar object behind your back. ? Your hands should be a little wider than shoulder width apart. ? Your palms should face away from your back. 2. Keeping your elbows straight and your shoulder muscles relaxed, move the stick away from your body until you feel a stretch in your shoulders (extension). ? Avoid shrugging your shoulders while you move the stick. Keep your shoulder blades tucked down toward the middle of your back. 3. Hold for __________ seconds. 4. Slowly return to the starting position. Repeat __________ times. Complete this exercise __________ times a day. Range-of-motion exercises Pendulum  1. Stand near a wall or a surface that you can hold onto for balance. 2. Bend at the waist and let your left / right arm hang straight down. Use your other arm to support you. Keep your back straight and do not lock your knees. 3. Relax your left / right arm and shoulder muscles, and move your  hips and your trunk so your left / right arm swings freely. Your arm should swing because of the motion of your body, not because you are using your arm or shoulder muscles. 4. Keep moving your hips and trunk so your arm swings in the following directions, as told by your health care provider: ? Side to side. ? Forward and backward. ? In clockwise and counterclockwise circles. 5. Continue each motion for __________ seconds, or for as long as told by your health care provider. 6. Slowly return to the  starting position. Repeat __________ times. Complete this exercise __________ times a day. Shoulder flexion, standing  1. Stand and hold a broomstick, a cane, or a similar object. Place your hands a little more than shoulder width apart on the object. Your left / right hand should be palm up, and your other hand should be palm down. 2. Keep your elbow straight and your shoulder muscles relaxed. Push the stick up with your healthy arm to raise your left / right arm in front of your body, and then over your head until you feel a stretch in your shoulder (flexion). ? Avoid shrugging your shoulder while you raise your arm. Keep your shoulder blade tucked down toward the middle of your back. 3. Hold for __________ seconds. 4. Slowly return to the starting position. Repeat __________ times. Complete this exercise __________ times a day. Shoulder abduction, standing 1. Stand and hold a broomstick, a cane, or a similar object. Place your hands a little more than shoulder width apart on the object. Your left / right hand should be palm up, and your other hand should be palm down. 2. Keep your elbow straight and your shoulder muscles relaxed. Push the object across your body toward your left / right side. Raise your left / right arm to the side of your body (abduction) until you feel a stretch in your shoulder. ? Do not raise your arm above shoulder height unless your health care provider tells you to do that. ? If directed, raise your arm over your head. ? Avoid shrugging your shoulder while you raise your arm. Keep your shoulder blade tucked down toward the middle of your back. 3. Hold for __________ seconds. 4. Slowly return to the starting position. Repeat __________ times. Complete this exercise __________ times a day. Internal rotation  1. Place your left / right hand behind your back, palm up. 2. Use your other hand to dangle an exercise band, a towel, or a similar object over your shoulder. Grasp  the band with your left / right hand so you are holding on to both ends. 3. Gently pull up on the band until you feel a stretch in the front of your left / right shoulder. The movement of your arm toward the center of your body is called internal rotation. ? Avoid shrugging your shoulder while you raise your arm. Keep your shoulder blade tucked down toward the middle of your back. 4. Hold for __________ seconds. 5. Release the stretch by letting go of the band and lowering your hands. Repeat __________ times. Complete this exercise __________ times a day. Strengthening exercises External rotation  1. Sit in a stable chair without armrests. 2. Secure an exercise band to a stable object at elbow height on your left / right side. 3. Place a soft object, such as a folded towel or a small pillow, between your left / right upper arm and your body to move your elbow about 4 inches (  10 cm) away from your side. 4. Hold the end of the exercise band so it is tight and there is no slack. 5. Keeping your elbow pressed against the soft object, slowly move your forearm out, away from your abdomen (external rotation). Keep your body steady so only your forearm moves. 6. Hold for __________ seconds. 7. Slowly return to the starting position. Repeat __________ times. Complete this exercise __________ times a day. Shoulder abduction  1. Sit in a stable chair without armrests, or stand up. 2. Hold a __________ weight in your left / right hand, or hold an exercise band with both hands. 3. Start with your arms straight down and your left / right palm facing in, toward your body. 4. Slowly lift your left / right hand out to your side (abduction). Do not lift your hand above shoulder height unless your health care provider tells you that this is safe. ? Keep your arms straight. ? Avoid shrugging your shoulder while you do this movement. Keep your shoulder blade tucked down toward the middle of your back. 5. Hold  for __________ seconds. 6. Slowly lower your arm, and return to the starting position. Repeat __________ times. Complete this exercise __________ times a day. Shoulder extension 1. Sit in a stable chair without armrests, or stand up. 2. Secure an exercise band to a stable object in front of you so it is at shoulder height. 3. Hold one end of the exercise band in each hand. Your palms should face each other. 4. Straighten your elbows and lift your hands up to shoulder height. 5. Step back, away from the secured end of the exercise band, until the band is tight and there is no slack. 6. Squeeze your shoulder blades together as you pull your hands down to the sides of your thighs (extension). Stop when your hands are straight down by your sides. Do not let your hands go behind your body. 7. Hold for __________ seconds. 8. Slowly return to the starting position. Repeat __________ times. Complete this exercise __________ times a day. Shoulder row 1. Sit in a stable chair without armrests, or stand up. 2. Secure an exercise band to a stable object in front of you so it is at waist height. 3. Hold one end of the exercise band in each hand. Position your palms so that your thumbs are facing the ceiling (neutral position). 4. Bend each of your elbows to a 90-degree angle (right angle) and keep your upper arms at your sides. 5. Step back until the band is tight and there is no slack. 6. Slowly pull your elbows back behind you. 7. Hold for __________ seconds. 8. Slowly return to the starting position. Repeat __________ times. Complete this exercise __________ times a day. Shoulder press-ups  1. Sit in a stable chair that has armrests. Sit upright, with your feet flat on the floor. 2. Put your hands on the armrests so your elbows are bent and your fingers are pointing forward. Your hands should be about even with the sides of your body. 3. Push down on the armrests and use your arms to lift yourself  off the chair. Straighten your elbows and lift yourself up as much as you comfortably can. ? Move your shoulder blades down, and avoid letting your shoulders move up toward your ears. ? Keep your feet on the ground. As you get stronger, your feet should support less of your body weight as you lift yourself up. 4. Hold for __________ seconds.  5. Slowly lower yourself back into the chair. Repeat __________ times. Complete this exercise __________ times a day. Wall push-ups  1. Stand so you are facing a stable wall. Your feet should be about one arm-length away from the wall. 2. Lean forward and place your palms on the wall at shoulder height. 3. Keep your feet flat on the floor as you bend your elbows and lean forward toward the wall. 4. Hold for __________ seconds. 5. Straighten your elbows to push yourself back to the starting position. Repeat __________ times. Complete this exercise __________ times a day. This information is not intended to replace advice given to you by your health care provider. Make sure you discuss any questions you have with your health care provider. Document Released: 08/05/2005 Document Revised: 01/13/2019 Document Reviewed: 10/21/2018 Elsevier Patient Education  2020 [employer]  Hand Exercises Hand exercises can be helpful for almost anyone. These exercises can strengthen the hands, improve flexibility and movement, and increase blood flow to the hands. These results can make work and daily tasks easier. Hand exercises can be especially helpful for people who have joint pain from arthritis or have nerve damage from overuse (carpal tunnel syndrome). These exercises can also help people who have injured a hand. Exercises Most of these hand exercises are gentle stretching and motion exercises. It is usually safe to do them often throughout the day. Warming up your hands before exercise may help to reduce stiffness. You can do this with gentle massage or by placing  your hands in warm water for 10-15 minutes. It is normal to feel some stretching, pulling, tightness, or mild discomfort as you begin new exercises. This will gradually improve. Stop an exercise right away if you feel sudden, severe pain or your pain gets worse. Ask your health care provider which exercises are best for you. Knuckle bend or "claw" fist 1. Stand or sit with your arm, hand, and all five fingers pointed straight up. Make sure to keep your wrist straight during the exercise. 2. Gently bend your fingers down toward your palm until the tips of your fingers are touching the top of your palm. Keep your big knuckle straight and just bend the small knuckles in your fingers. 3. Hold this position for __________ seconds. 4. Straighten (extend) your fingers back to the starting position. Repeat this exercise 5-10 times with each hand. Full finger fist 1. Stand or sit with your arm, hand, and all five fingers pointed straight up. Make sure to keep your wrist straight during the exercise. 2. Gently bend your fingers into your palm until the tips of your fingers are touching the middle of your palm. 3. Hold this position for __________ seconds. 4. Extend your fingers back to the starting position, stretching every joint fully. Repeat this exercise 5-10 times with each hand. Straight fist 1. Stand or sit with your arm, hand, and all five fingers pointed straight up. Make sure to keep your wrist straight during the exercise. 2. Gently bend your fingers at the big knuckle, where your fingers meet your hand, and the middle knuckle. Keep the knuckle at the tips of your fingers straight and try to touch the bottom of your palm. 3. Hold this position for __________ seconds. 4. Extend your fingers back to the starting position, stretching every joint fully. Repeat this exercise 5-10 times with each hand. Tabletop 1. Stand or sit with your arm, hand, and all five fingers pointed straight up. Make sure  to keep your  wrist straight during the exercise. 2. Gently bend your fingers at the big knuckle, where your fingers meet your hand, as far down as you can while keeping the small knuckles in your fingers straight. Think of forming a tabletop with your fingers. 3. Hold this position for __________ seconds. 4. Extend your fingers back to the starting position, stretching every joint fully. Repeat this exercise 5-10 times with each hand. Finger spread 1. Place your hand flat on a table with your palm facing down. Make sure your wrist stays straight as you do this exercise. 2. Spread your fingers and thumb apart from each other as far as you can until you feel a gentle stretch. Hold this position for __________ seconds. 3. Bring your fingers and thumb tight together again. Hold this position for __________ seconds. Repeat this exercise 5-10 times with each hand. Making circles 1. Stand or sit with your arm, hand, and all five fingers pointed straight up. Make sure to keep your wrist straight during the exercise. 2. Make a circle by touching the tip of your thumb to the tip of your index finger. 3. Hold for __________ seconds. Then open your hand wide. 4. Repeat this motion with your thumb and each finger on your hand. Repeat this exercise 5-10 times with each hand. Thumb motion 1. Sit with your forearm resting on a table and your wrist straight. Your thumb should be facing up toward the ceiling. Keep your fingers relaxed as you move your thumb. 2. Lift your thumb up as high as you can toward the ceiling. Hold for __________ seconds. 3. Bend your thumb across your palm as far as you can, reaching the tip of your thumb for the small finger (pinkie) side of your palm. Hold for __________ seconds. Repeat this exercise 5-10 times with each hand. Grip strengthening  1. Hold a stress ball or other soft ball in the middle of your hand. 2. Slowly increase the pressure, squeezing the ball as much as you  can without causing pain. Think of bringing the tips of your fingers into the middle of your palm. All of your finger joints should bend when doing this exercise. 3. Hold your squeeze for __________ seconds, then relax. Repeat this exercise 5-10 times with each hand. Contact a health care provider if:  Your hand pain or discomfort gets much worse when you do an exercise.  Your hand pain or discomfort does not improve within 2 hours after you exercise. If you have any of these problems, stop doing these exercises right away. Do not do them again unless your health care provider says that you can. Get help right away if:  You develop sudden, severe hand pain or swelling. If this happens, stop doing these exercises right away. Do not do them again unless your health care provider says that you can. This information is not intended to replace advice given to you by your health care provider. Make sure you discuss any questions you have with your health care provider. Document Released: 09/02/2015 Document Revised: 01/12/2019 Document Reviewed: 09/22/2018 Elsevier Patient Education  2020 [employer].

## 2019-07-13 NOTE — Progress Notes (Signed)
CMP WNL.  WBC count is borderline elevated.  He is on long term prednisone 5 mg po daily.  Rest of CBC WNL.

## 2019-07-15 LAB — CBC WITH DIFFERENTIAL/PLATELET
Absolute Monocytes: 873 cells/uL (ref 200–950)
Basophils Absolute: 74 cells/uL (ref 0–200)
Basophils Relative: 0.6 %
Eosinophils Absolute: 234 cells/uL (ref 15–500)
Eosinophils Relative: 1.9 %
HCT: 48.4 % (ref 38.5–50.0)
Hemoglobin: 16.2 g/dL (ref 13.2–17.1)
Lymphs Abs: 2694 cells/uL (ref 850–3900)
MCH: 29.5 pg (ref 27.0–33.0)
MCHC: 33.5 g/dL (ref 32.0–36.0)
MCV: 88 fL (ref 80.0–100.0)
MPV: 11.7 fL (ref 7.5–12.5)
Monocytes Relative: 7.1 %
Neutro Abs: 8426 cells/uL — ABNORMAL HIGH (ref 1500–7800)
Neutrophils Relative %: 68.5 %
Platelets: 238 10*3/uL (ref 140–400)
RBC: 5.5 10*6/uL (ref 4.20–5.80)
RDW: 13.6 % (ref 11.0–15.0)
Total Lymphocyte: 21.9 %
WBC: 12.3 10*3/uL — ABNORMAL HIGH (ref 3.8–10.8)

## 2019-07-15 LAB — COMPLETE METABOLIC PANEL WITH GFR
AG Ratio: 1.8 (calc) (ref 1.0–2.5)
ALT: 18 U/L (ref 9–46)
AST: 12 U/L (ref 10–35)
Albumin: 4 g/dL (ref 3.6–5.1)
Alkaline phosphatase (APISO): 56 U/L (ref 35–144)
BUN: 15 mg/dL (ref 7–25)
CO2: 26 mmol/L (ref 20–32)
Calcium: 9.5 mg/dL (ref 8.6–10.3)
Chloride: 105 mmol/L (ref 98–110)
Creat: 0.93 mg/dL (ref 0.70–1.33)
GFR, Est African American: 107 mL/min/{1.73_m2} (ref >=60)
GFR, Est Non African American: 93 mL/min/{1.73_m2} (ref >=60)
Globulin: 2.2 g/dL (calc) (ref 1.9–3.7)
Glucose, Bld: 85 mg/dL (ref 65–99)
Potassium: 3.9 mmol/L (ref 3.5–5.3)
Sodium: 141 mmol/L (ref 135–146)
Total Bilirubin: 0.3 mg/dL (ref 0.2–1.2)
Total Protein: 6.2 g/dL (ref 6.1–8.1)

## 2019-07-15 LAB — QUANTIFERON-TB GOLD PLUS
Mitogen-NIL: 8.34 IU/mL
NIL: 0.03 IU/mL
QuantiFERON-TB Gold Plus: NEGATIVE
TB1-NIL: 0 IU/mL
TB2-NIL: 0.01 IU/mL

## 2019-07-17 NOTE — Progress Notes (Signed)
TB gold negative

## 2019-08-08 ENCOUNTER — Other Ambulatory Visit: Payer: Self-pay | Admitting: Rheumatology

## 2019-08-08 NOTE — Telephone Encounter (Signed)
Last Visit: 07/12/19 Next visit: 10/12/19 Labs: 07/12/19 CMP WNL. WBC count is borderline elevated. Rest of CBC WNL TB Gold: 07/12/19 Neg   Okay to refill per Dr. Estanislado Pandy

## 2019-10-12 ENCOUNTER — Ambulatory Visit: Payer: BC Managed Care – PPO | Admitting: Rheumatology

## 2019-10-30 ENCOUNTER — Other Ambulatory Visit: Payer: Self-pay | Admitting: *Deleted

## 2019-10-30 MED ORDER — ORENCIA CLICKJECT 125 MG/ML ~~LOC~~ SOAJ: 125.0000 mg | SUBCUTANEOUS | 0 refills | Status: DC

## 2019-10-30 NOTE — Telephone Encounter (Signed)
Refill request received via fax  Last Visit: 07/12/19 Next visit: 12/07/19 Labs: 07/12/19 CMP WNL. WBC count is borderline elevated. Rest of CBC WNL TB Gold: 07/12/19 Neg   Patient advised he is due to update labs. Patient will update 11/02/19  Okay to refill 30 day supply per Dr Corliss Skains

## 2019-11-06 ENCOUNTER — Telehealth: Payer: Self-pay | Admitting: Rheumatology

## 2019-11-06 NOTE — Telephone Encounter (Signed)
Patient scheduled for 11/07/19 at 10:15 am.

## 2019-11-06 NOTE — Progress Notes (Signed)
Office Visit Note  Patient: Gregory Hinton             Date of Birth: Sep 05, 1966           MRN: [id number]             PCP: [facility] Referring: [facility] Visit Date: 11/07/2019 Occupation: @GUAROCC @  Subjective:  Left knee joint pain   History of Present Illness: Gregory Hinton is a 54 y.o. male with history of seronegative rheumatoid arthritis and osteoarthritis.  He is on Orencia 125 mg subcutaneous injections once weekly, Arava 20 mg by mouth daily, prednisone 5 mg by mouth daily.  He states for the past 1 month he has been experiencing increased pain and inflammation in the left knee joint.  He states that in the evenings after working he has a large joint effusion.  He has tried using ice, heat, and Salonpas.  He has been taking Advil 200 mg 3 to 4 tablets 3 times daily for pain relief.  He states when the pain and inflammation is severe he takes prednisone 10 mg by mouth daily.  He continues to have chronic pain in both hands which he attributes to his job.  He has chronic pain and stiffness in both shoulder joints.  He experiences nocturnal pain and wakes up every hour due to discomfort in both shoulders.  Activities of Daily Living:  Patient reports morning stiffness for 1 hour.   Patient Reports nocturnal pain.  Difficulty dressing/grooming: Denies Difficulty climbing stairs: Denies Difficulty getting out of chair: Denies Difficulty using hands for taps, buttons, cutlery, and/or writing: Denies  Review of Systems  Constitutional: Positive for fatigue. Negative for night sweats.  HENT: Negative for mouth sores, mouth dryness and nose dryness.   Eyes: Negative for redness, itching and dryness.  Respiratory: Negative for cough, hemoptysis, shortness of breath and difficulty breathing.   Cardiovascular: Negative for chest pain, palpitations, hypertension, irregular heartbeat and swelling in legs/feet.  Gastrointestinal: Negative for  blood in stool, constipation and diarrhea.  Endocrine: Negative for increased urination.  Genitourinary: Negative for difficulty urinating and painful urination.  Musculoskeletal: Positive for arthralgias, joint pain, joint swelling and morning stiffness. Negative for myalgias, muscle weakness, muscle tenderness and myalgias.  Skin: Negative for color change, rash, hair loss, nodules/bumps, skin tightness, ulcers and sensitivity to sunlight.  Allergic/Immunologic: Negative for susceptible to infections.  Neurological: Negative for dizziness, fainting, headaches, memory loss, night sweats and weakness.  Hematological: Negative for swollen glands.  Psychiatric/Behavioral: Negative for depressed mood, confusion and sleep disturbance. The patient is not nervous/anxious.     PMFS History:  Patient Active Problem List   Diagnosis Date Noted  . High risk medication use 04/19/2018  . History of diverticulitis 05/04/2017  . History of colon polyps 05/04/2017  . Primary osteoarthritis of both hands 05/04/2017  . Primary osteoarthritis of both feet 05/04/2017  . Primary osteoarthritis of both knees 04/02/2017  . Class 1 obesity without serious comorbidity with body mass index (BMI) of 32.0 to 32.9 in adult 10/14/2016  . Spondylosis of lumbar region without myelopathy or radiculopathy 10/14/2016  . DJD (degenerative joint disease), cervical 10/14/2016  . High risk medications (not anticoagulants) long-term use 10/14/2016  . Rheumatoid arthritis, seronegative, multiple sites (HCC) 10/14/2016  . Lumbar stenosis with neurogenic claudication 12/17/2015    Past Medical History:  Diagnosis Date  . Adrenal insufficiency (HCC)   . Anxiety   . Arthritis   .  Back pain    had 3 fusions surgeries  . H/O cardiovascular stress test 2014   admitted at [facility]. overnight for chest pain, stress/echo test wnl  . Hypertension   . PONV (postoperative nausea and vomiting)   . Rheumatoid arthritis ([place])    manages  by Dr. Estanislado Pandy, rec'd Orencia weekly for 2 months, switched from Humira     Family History  Problem Relation Age of Onset  . Aneurysm Mother   . Leukemia Father   . Healthy Daughter   . Healthy Son    Past Surgical History:  Procedure Laterality Date  . ANTERIOR LAT LUMBAR FUSION Left 12/26/2018   Procedure: Left Lumbar Two-Three Anterolateral decompression/interbody fusion/lateral plate;  Surgeon: Kristeen Miss, MD;  Location: [facility];  Service: Neurosurgery;  Laterality: Left;  Anterolateral  . BACK SURGERY  [id number]   lumbar fusion 3 times  . CARPAL TUNNEL RELEASE Right 2000   and left side 2 years ago with left ulnar surgery  . COLONOSCOPY    . HERNIA REPAIR Right 2001   inguinal    Social History   Social History Narrative  . Not on file   Immunization History  Administered Date(s) Administered  . Influenza-Unspecified 05/05/2014  . Pneumococcal Polysaccharide-23 04/25/2014  . Zoster 04/25/2014     Objective: Vital Signs: BP 129/83 (BP Location: Left Arm, Patient Position: Sitting, Cuff Size: Normal)   Pulse 86   Resp 15   Ht 6\' 4"  (1.93 m)   Wt 281 lb (127.5 kg)   BMI 34.20 kg/m    Physical Exam Vitals and nursing note reviewed.  Constitutional:      Appearance: He is well-developed.  HENT:     Head: Normocephalic and atraumatic.  Eyes:     Conjunctiva/sclera: Conjunctivae normal.     Pupils: Pupils are equal, round, and reactive to light.  Cardiovascular:     Rate and Rhythm: Normal rate and regular rhythm.     Heart sounds: Normal heart sounds.  Pulmonary:     Effort: Pulmonary effort is normal.     Breath sounds: Normal breath sounds.  Abdominal:     General: Bowel sounds are normal.     Palpations: Abdomen is soft.  Musculoskeletal:     Cervical back: Normal range of motion and neck supple.  Skin:    General: Skin is warm and dry.     Capillary Refill: Capillary refill takes less than 2 seconds.  Neurological:     Mental Status: He is alert  and oriented to person, place, and time.  Psychiatric:        Behavior: Behavior normal.      Musculoskeletal Exam: C-spine limited range of motion.  Shoulder joints abduction to 120 degrees bilaterally.  Elbow joints have good range of motion.  He has slightly limited range of motion bilateral wrist joints.  He has tenderness of the right second MCP joint.  He has PIP and DIP synovial thickening consistent with osteoarthritis of both hands.Hip joints have good range of motion.  Right knee has good range of motion with no warmth or effusion.  Left knee has warmth and moderate effusion.  Ankle joints have good range of motion with no tenderness or inflammation.  He does have pedal edema bilaterally.  No tenderness of the MTP joints.  CDAI Exam: CDAI Score: -- Patient Global: --; Provider Global: -- Swollen: 1 ; Tender: 3  Joint Exam 11/07/2019      Right  Left  Glenohumeral  Tender   Tender  Knee     Swollen Tender     Investigation: No additional findings.  Imaging: No results found.  Recent Labs: Lab Results  Component Value Date   WBC 12.3 (H) 07/12/2019   HGB 16.2 07/12/2019   PLT 238 07/12/2019   NA 141 07/12/2019   K 3.9 07/12/2019   CL 105 07/12/2019   CO2 26 07/12/2019   GLUCOSE 85 07/12/2019   BUN 15 07/12/2019   CREATININE 0.93 07/12/2019   BILITOT 0.3 07/12/2019   ALKPHOS 28 (L) 11/24/2017   AST 12 07/12/2019   ALT 18 07/12/2019   PROT 6.2 07/12/2019   ALBUMIN 3.0 (L) 11/24/2017   CALCIUM 9.5 07/12/2019   GFRAA 107 07/12/2019   QFTBGOLDPLUS NEGATIVE 07/12/2019    Speciality Comments: Prior therapy:  MTX, PLQ, Enbrel, and Humira (inadequate response). Due to his history of diverticulitis, Harriette Ohara is not an option.  Procedures:  Large Joint Inj: L knee on 11/07/2019 10:49 AM Indications: pain Details: 27 G 1.5 in needle, medial approach  Arthrogram: No  Medications: 1.5 mL lidocaine 1 %; 40 mg triamcinolone acetonide 40 MG/ML Aspirate: 3 mL Outcome:  tolerated well, no immediate complications Procedure, treatment alternatives, risks and benefits explained, specific risks discussed. Consent was given by the patient. Immediately prior to procedure a time out was called to verify the correct patient, procedure, equipment, support staff and site/side marked as required. Patient was prepped and draped in the usual sterile fashion.     Allergies: Eggs or egg-derived products   Assessment / Plan:     Visit Diagnoses: Rheumatoid arthritis, seronegative, multiple sites [facility]): He presents today with a moderate left knee joint effusion which started 1 month ago.  He continues to have chronic nocturnal pain in bilateral shoulder joints.  He has chronic pain and stiffness in both hands but no synovitis was noted today.  He continues to inject Orencia 125 mg subcutaneously once weekly and takes Arava 20 mg by mouth daily.  He is on long-term prednisone 5 mg daily and has been taking Advil 200 mg 3 to 4 tablets 3 times daily for pain relief since his left knee joint effusion started 1 month ago. He was advised to discontinued Advil while taking prednisone.  We discussed in the future if he continues to have recurrent flares we will add on plaquenil as triple therapy.  His treatment options are limited due to his past medical history of diverticulitis.  He has failed Enbrel, Humira, methotrexate, and Plaquenil in the past.  He will continue on Orencia 125 mg sq injections once weekly and Arava 20 mg 1 tablet by mouth daily at this time.  He is on long term prednisone 5 mg po daily, so I will place a future order for DEXA.  He was advised to notify us if he continues to have recurrent flares.    He will follow up in 3 months.   High risk medication use - Orencia 125 mg sq weekly injection and Arava 20 mg po daily (failed MTX, PLQ, Enbrel, and Humira in the past.)  CBC and CMP are due today.  He will return for lab work in May and every 3 months.  TB gold negative on  07/12/19. - Plan: CBC with Differential/Platelet, COMPLETE METABOLIC PANEL WITH GFR  Primary osteoarthritis of both hands: He has PIP and DIP synovial thickening consistent with osteoarthritis of both hands.  He has chronic pain and stiffness in both hands.  He works  with his hands on a daily basis for long hours at work.  Joint protection and muscle strengthening were discussed.  Primary osteoarthritis of both knees: He has has been experiencing increased pain by limiting joints for the past 1 month.  He presents today with warmth, tenderness, and a moderate left knee joint effusion.  He has not had any recent injuries or falls.  He requested a left knee joint cortisone injection.  Effusion, left knee: He presents today with a left knee joint moderate effusion which started 1 month ago.  He did not have any injuries or falls at that time.  He is not experiencing any mechanical symptoms.  He has not missed any doses of Orencia or Arava.  He continues to take prednisone 5 mg by mouth daily.  He is also been taking Advil 200 mg 3 to 4 tablets 3 times daily for pain relief.  He was advised to avoid NSAIDs while taking prednisone. He has tried using ice heat and Salonpas topically without any relief.  Treatment options were discussed.  He requested a left knee joint cortisone injection. 3 ml of fluid was drawn off. He tolerated procedure well.  The procedure note was completed above.  Aftercare was discussed.  Primary osteoarthritis of both feet: He has chronic pain in both feet.  He has not noticed any increased inflammation.  He does have pedal edema bilaterally.  He takes Lasix as prescribed.  DDD (degenerative disc disease), cervical: He has chronic neck pain and stiffness.    DDD (degenerative disc disease), lumbar: Chronic pain  Spondylosis of lumbar region without myelopathy or radiculopathy: Chronic pain  Other medical conditions are listed as possible  History of colon polyps  History of  diverticulitis  Orders: Orders Placed This Encounter  Procedures  . Large Joint Inj  . CBC with Differential/Platelet  . COMPLETE METABOLIC PANEL WITH GFR   No orders of the defined types were placed in this encounter.   Face-to-face time spent with patient was 30 minutes. Greater than 50% of time was spent in counseling and coordination of care.  Follow-Up Instructions: Return in about 3 months (around 02/04/2020) for Rheumatoid arthritis, Osteoarthritis, DDD.   Gearldine Bienenstock, PA-C   I examined and evaluated the patient with Sherron Ales PA.  Patient continues to have some discomfort from rheumatoid arthritis.  He presented with left knee joint effusion today.  He was having difficulty walking.  After informed consent was obtained I aspirated his left knee joint and it was injected with cortisone.  He tolerated the procedure well.  Due to history of diverticulitis he does not have very many options.  Although we can use additional DMARDs if he has recurrence of symptoms.  The plan of care was discussed as noted above.  Pollyann Savoy, MD  Note - This record has been created using Animal nutritionist.  Chart creation errors have been sought, but may not always  have been located. Such creation errors do not reflect on  the standard of medical care.

## 2019-11-06 NOTE — Telephone Encounter (Signed)
Patient left a voicemail stating he had labwork at [facility] and is checking if Dr. Corliss Skains received the results.  Patient also states his "left knee is swollen and is not sure what he needs to do about it."  Patient requested a return call.

## 2019-11-06 NOTE — Telephone Encounter (Signed)
Please schedule appointment to for evaluation.

## 2019-11-06 NOTE — Telephone Encounter (Signed)
Patient advised we have not received the lab results. Patient will call [facility] to have them re-fax. Patient states he is having pain swelling in left knee. Patient states the right knee has pain and minimal swelling. Patient states the left one is worse. Patient states working and standing on it makes the pain and swelling worse. Patient states when he takes his lasix he feels some relief. Patient states he is taking [place] and [place]. Patient denies missing any doses. Please advise.

## 2019-11-07 ENCOUNTER — Encounter: Payer: Self-pay | Admitting: Rheumatology

## 2019-11-07 ENCOUNTER — Ambulatory Visit (INDEPENDENT_AMBULATORY_CARE_PROVIDER_SITE_OTHER): Payer: BC Managed Care – PPO | Admitting: Rheumatology

## 2019-11-07 ENCOUNTER — Other Ambulatory Visit: Payer: Self-pay

## 2019-11-07 VITALS — BP 129/83 | HR 86 | Resp 15 | Ht 76.0 in | Wt 281.0 lb

## 2019-11-07 DIAGNOSIS — Z79899 Other long term (current) drug therapy: Secondary | ICD-10-CM

## 2019-11-07 DIAGNOSIS — M47816 Spondylosis without myelopathy or radiculopathy, lumbar region: Secondary | ICD-10-CM

## 2019-11-07 DIAGNOSIS — M0609 Rheumatoid arthritis without rheumatoid factor, multiple sites: Secondary | ICD-10-CM | POA: Diagnosis not present

## 2019-11-07 DIAGNOSIS — M170 Bilateral primary osteoarthritis of knee: Secondary | ICD-10-CM | POA: Diagnosis not present

## 2019-11-07 DIAGNOSIS — M19041 Primary osteoarthritis, right hand: Secondary | ICD-10-CM

## 2019-11-07 DIAGNOSIS — M25462 Effusion, left knee: Secondary | ICD-10-CM | POA: Diagnosis not present

## 2019-11-07 DIAGNOSIS — M5030 Other cervical disc degeneration, unspecified cervical region: Secondary | ICD-10-CM

## 2019-11-07 DIAGNOSIS — M5136 Other intervertebral disc degeneration, lumbar region: Secondary | ICD-10-CM

## 2019-11-07 DIAGNOSIS — Z7952 Long term (current) use of systemic steroids: Secondary | ICD-10-CM

## 2019-11-07 DIAGNOSIS — M19071 Primary osteoarthritis, right ankle and foot: Secondary | ICD-10-CM

## 2019-11-07 DIAGNOSIS — Z860100 Personal history of colon polyps, unspecified: Secondary | ICD-10-CM

## 2019-11-07 DIAGNOSIS — M19042 Primary osteoarthritis, left hand: Secondary | ICD-10-CM

## 2019-11-07 DIAGNOSIS — Z13820 Encounter for screening for osteoporosis: Secondary | ICD-10-CM

## 2019-11-07 DIAGNOSIS — M51369 Other intervertebral disc degeneration, lumbar region without mention of lumbar back pain or lower extremity pain: Secondary | ICD-10-CM

## 2019-11-07 DIAGNOSIS — M19072 Primary osteoarthritis, left ankle and foot: Secondary | ICD-10-CM

## 2019-11-07 DIAGNOSIS — Z8719 Personal history of other diseases of the digestive system: Secondary | ICD-10-CM

## 2019-11-07 MED ORDER — LIDOCAINE HCL 1 % IJ SOLN
1.5000 mL | INTRAMUSCULAR | Status: AC | PRN
  Administered 2019-11-07: 1.5 mL

## 2019-11-07 MED ORDER — TRIAMCINOLONE ACETONIDE 40 MG/ML IJ SUSP
40.0000 mg | INTRAMUSCULAR | Status: AC | PRN
  Administered 2019-11-07: 40 mg via INTRA_ARTICULAR

## 2019-11-07 NOTE — Progress Notes (Signed)
Medication Samples have been provided to the patient.  Drug name: Orencia Strength: 125mg     Qty: 1 LOT Exp.Date: 01/2020  Dosing instructions: Inject 125mg  into the skin once weekly.   The patient has been instructed regarding the correct time, dose, and frequency of taking this medication, including desired effects and most common side effects.   02/2020 10:27 AM 11/07/2019

## 2019-11-07 NOTE — Addendum Note (Signed)
Addended by: Ellen Henri on: 11/07/2019 02:24 PM   Modules accepted: Orders

## 2019-11-08 LAB — CBC WITH DIFFERENTIAL/PLATELET
Absolute Monocytes: 993 cells/uL — ABNORMAL HIGH (ref 200–950)
Basophils Absolute: 54 cells/uL (ref 0–200)
Basophils Relative: 0.4 %
Eosinophils Absolute: 54 cells/uL (ref 15–500)
Eosinophils Relative: 0.4 %
HCT: 45.4 % (ref 38.5–50.0)
Hemoglobin: 15.4 g/dL (ref 13.2–17.1)
Lymphs Abs: 3074 cells/uL (ref 850–3900)
MCH: 30.3 pg (ref 27.0–33.0)
MCHC: 33.9 g/dL (ref 32.0–36.0)
MCV: 89.4 fL (ref 80.0–100.0)
MPV: 11.1 fL (ref 7.5–12.5)
Monocytes Relative: 7.3 %
Neutro Abs: 9425 cells/uL — ABNORMAL HIGH (ref 1500–7800)
Neutrophils Relative %: 69.3 %
Platelets: 254 10*3/uL (ref 140–400)
RBC: 5.08 10*6/uL (ref 4.20–5.80)
RDW: 13.3 % (ref 11.0–15.0)
Total Lymphocyte: 22.6 %
WBC: 13.6 10*3/uL — ABNORMAL HIGH (ref 3.8–10.8)

## 2019-11-08 LAB — COMPLETE METABOLIC PANEL WITH GFR
AG Ratio: 2.1 (calc) (ref 1.0–2.5)
ALT: 18 U/L (ref 9–46)
AST: 14 U/L (ref 10–35)
Albumin: 4 g/dL (ref 3.6–5.1)
Alkaline phosphatase (APISO): 44 U/L (ref 35–144)
BUN: 24 mg/dL (ref 7–25)
CO2: 28 mmol/L (ref 20–32)
Calcium: 9.4 mg/dL (ref 8.6–10.3)
Chloride: 102 mmol/L (ref 98–110)
Creat: 1.1 mg/dL (ref 0.70–1.33)
GFR, Est African American: 88 mL/min/{1.73_m2} (ref >=60)
GFR, Est Non African American: 76 mL/min/{1.73_m2} (ref >=60)
Globulin: 1.9 g/dL (calc) (ref 1.9–3.7)
Glucose, Bld: 85 mg/dL (ref 65–99)
Potassium: 3.5 mmol/L (ref 3.5–5.3)
Sodium: 142 mmol/L (ref 135–146)
Total Bilirubin: 0.5 mg/dL (ref 0.2–1.2)
Total Protein: 5.9 g/dL — ABNORMAL LOW (ref 6.1–8.1)

## 2019-11-08 NOTE — Progress Notes (Signed)
WBC count is elevated but stable. He is on long term prednisone.  No recent infections.   CMP stable.

## 2019-11-13 ENCOUNTER — Telehealth: Payer: Self-pay | Admitting: Rheumatology

## 2019-11-13 NOTE — Telephone Encounter (Signed)
Can we issue patient a new sample since the first was defective? Please advise.

## 2019-11-13 NOTE — Telephone Encounter (Signed)
Medication Samples have been provided to the patient.  Drug name: orencia Strength: 125mg      Qty: 1 LOT  Exp.Date: 08/04/2020  Dosing instructions: Inject 125mg  into the skin once weekly.   The patient has been instructed regarding the correct time, dose, and frequency of taking this medication, including desired effects and most common side effects.   08/06/2020 11:21 AM 11/13/2019

## 2019-11-13 NOTE — Telephone Encounter (Signed)
Please clarify if he received any of the enbrel injection.  If he is unsure if any was dispensed he should hold this week and resume next week.  He can come to the office for a sample of [place]ren[place] for his next injection.

## 2019-11-13 NOTE — Telephone Encounter (Signed)
Patient called stating he picked up the sample of Orencia last week.  Patient states his wife tried to give him the injection and the injector clicked, but the medicine went on top of his leg.  The needle didn't penetrate the skin.  Patient states he leaves for [place] this afternoon and is requesting to stop by the office to pick up another sample.

## 2019-11-13 NOTE — Telephone Encounter (Signed)
Patient reports the auto injector clicked and the needle did not move and did not penetrate the skin. Patient is 100% sure that no medication was injected in to the skin. Advised patient he can come by the office for a sample and patient states that his wife will pick it up.

## 2019-11-20 ENCOUNTER — Telehealth: Payer: Self-pay | Admitting: Rheumatology

## 2019-11-20 NOTE — Telephone Encounter (Signed)
Patient called stating he came back from [place] last night with his left knee swollen and very painful.  Patient states he had fluid drained off his knee at his last appointment with Dr. Corliss Skains on 11/07/19.  Patient states he was nervous that it could be a blood clot or cellulitis which he has had in the past so went to [facility] in [place] last night.  Patient states they did an x-ray and were able to rule out an infection.  Patient is requesting a return call to discuss what he needs to do next.

## 2019-11-20 NOTE — Telephone Encounter (Signed)
Per urgent care note from 11/19/2019, no warmth of joint and no findings concerning for DVT. patient has been taking NSAIDs with minimal relief. You saw the patient on 11/07/2019 for knee joint aspiration and cortisone injection.

## 2019-11-20 NOTE — Telephone Encounter (Signed)
Advised patient that Dr. Corliss Skains recommends that patient should see his PCP to rule out cellulitis or infection. Patient verbalized understanding and will contact his PCP.

## 2019-11-20 NOTE — Telephone Encounter (Signed)
I would recommend that patient should see his PCP to rule out cellulitis or infection.

## 2019-11-28 ENCOUNTER — Telehealth: Payer: Self-pay | Admitting: Rheumatology

## 2019-11-28 NOTE — Telephone Encounter (Signed)
Patient states lt knee still bothering him. GP ruled out Cellulitis. Patient states the GP gave him a Kenalog injection on  11/23/19. Patient states he has had not relief. Patient states the GP recommended for him to go she orthopedic. Patient advised he can make an appointment with [facility]. Patient wanted to know if you have any other recommendations.

## 2019-11-28 NOTE — Telephone Encounter (Signed)
Patient states lt knee still bothering him. GP ruled out Cellulitis. Patient states seeing an Orthopedist was recommended. Patient would like to know whom Dr. Corliss Skains would recommend. Please call to advise.

## 2019-11-28 NOTE — Telephone Encounter (Signed)
Left a message for the patient to schedule an appointment.  We will have to discuss treatment options.  1 option could be to add low-dose methotrexate to his regimen.

## 2019-11-30 NOTE — Telephone Encounter (Signed)
Patient has been scheduled for an appointment on 12/07/19 at 8:30 am to discuss treatment options.

## 2019-12-01 NOTE — Progress Notes (Signed)
Office Visit Note  Patient: Gregory Hinton             Date of Birth: 05/09/1966           MRN: [id number]             PCP: [facility] Referring: [facility] Visit Date: 12/07/2019 Occupation: @GUAROCC @  Subjective:  Left knee joint pain   History of Present Illness: Gregory Hinton is a 54 y.o. male with history of seronegative rheumatoid arthritis, osteoarthritis, and DDD.  Patient is on Orencia 125 mg of days injections once weekly, Arava 20 mg by mouth daily, prednisone 10 mg by mouth daily.  He has increased the dose of prednisone from 5 mg daily to 10 mg daily due to the increased pain and inflammation he has been experiencing.  He states that his weekly Orencia dose only lasts about 4 days and then his joint pain and inflammation returns.  He continues to have pain and effusion in the left knee joint.  He had a left knee joint aspiration performed by Dr. Roda Shutters on 12/04/2019.  He is planning on proceeding with a left knee replacement in November 2021.  He has been experiencing increased pain in the right knee joint which she attributes to his gait change.  He is having severe pain in both shoulder joints which keep him up at night.  He continues to have pain and stiffness in both hands and both wrist joints.  He has chronic neck and lower back pain.  He has been experiencing increased fatigue since he started having recurrent flares.  Activities of Daily Living:  Patient reports joint stiffness all day  Patient Reports nocturnal pain.  Difficulty dressing/grooming: Denies Difficulty climbing stairs: Reports Difficulty getting out of chair: Reports Difficulty using hands for taps, buttons, cutlery, and/or writing: Denies  Review of Systems  Constitutional: Positive for fatigue. Negative for night sweats.  HENT: Negative for mouth sores, mouth dryness and nose dryness.   Eyes: Negative for redness, itching and dryness.  Respiratory: Negative for  shortness of breath and difficulty breathing.   Cardiovascular: Negative for chest pain, palpitations, hypertension, irregular heartbeat and swelling in legs/feet.  Gastrointestinal: Negative for blood in stool, constipation and diarrhea.  Endocrine: Negative for increased urination.  Genitourinary: Negative for difficulty urinating and painful urination.  Musculoskeletal: Positive for arthralgias, gait problem, joint pain, joint swelling and morning stiffness. Negative for myalgias, muscle weakness, muscle tenderness and myalgias.  Skin: Negative for color change, rash, hair loss, nodules/bumps, skin tightness, ulcers and sensitivity to sunlight.  Allergic/Immunologic: Negative for susceptible to infections.  Neurological: Negative for dizziness, fainting, headaches, memory loss, night sweats and weakness.  Hematological: Positive for bruising/bleeding tendency. Negative for swollen glands.  Psychiatric/Behavioral: Negative for depressed mood, confusion and sleep disturbance. The patient is not nervous/anxious.     PMFS History:  Patient Active Problem List   Diagnosis Date Noted  . High risk medication use 04/19/2018  . History of diverticulitis 05/04/2017  . History of colon polyps 05/04/2017  . Primary osteoarthritis of both hands 05/04/2017  . Primary osteoarthritis of both feet 05/04/2017  . Primary osteoarthritis of both knees 04/02/2017  . Class 1 obesity without serious comorbidity with body mass index (BMI) of 32.0 to 32.9 in adult 10/14/2016  . Spondylosis of lumbar region without myelopathy or radiculopathy 10/14/2016  . DJD (degenerative joint disease), cervical 10/14/2016  . High risk medications (not anticoagulants) long-term use 10/14/2016  .  Rheumatoid arthritis, seronegative, multiple sites (HCC) 10/14/2016  . Lumbar stenosis with neurogenic claudication 12/17/2015    Past Medical History:  Diagnosis Date  . Adrenal insufficiency (HCC)   . Anxiety   . Arthritis   .  Back pain    had 3 fusions surgeries  . H/O cardiovascular stress test 2014   admitted at hosp. overnight for chest pain, stress/echo test wnl  . Hypertension   . PONV (postoperative nausea and vomiting)   . Rheumatoid arthritis (HCC)    manages by Dr. Corliss Skains, rec'd Orencia weekly for 2 months, switched from Humira     Family History  Problem Relation Age of Onset  . Aneurysm Mother   . Leukemia Father   . Healthy Daughter   . Healthy Son    Past Surgical History:  Procedure Laterality Date  . ANTERIOR LAT LUMBAR FUSION Left 12/26/2018   Procedure: Left Lumbar Two-Three Anterolateral decompression/interbody fusion/lateral plate;  Surgeon: Barnett Abu, MD;  Location: [facility] OR;  Service: Neurosurgery;  Laterality: Left;  Anterolateral  . BACK SURGERY  [id number]   lumbar fusion 3 times  . CARPAL TUNNEL RELEASE Right 2000   and left side 2 years ago with left ulnar surgery  . COLONOSCOPY    . HERNIA REPAIR Right 2001   inguinal    Social History   Social History Narrative  . Not on file   Immunization History  Administered Date(s) Administered  . Influenza-Unspecified 05/05/2014  . Pneumococcal Polysaccharide-23 04/25/2014  . Zoster 04/25/2014     Objective: Vital Signs: BP (!) 143/94 (BP Location: Left Arm, Patient Position: Sitting, Cuff Size: Normal)   Pulse 84   Resp 16   Ht 6\' 4"  (1.93 m)   Wt 278 lb (126.1 kg)   BMI 33.84 kg/m    Physical Exam Vitals and nursing note reviewed.  Constitutional:      Appearance: He is well-developed.  HENT:     Head: Normocephalic and atraumatic.  Eyes:     Conjunctiva/sclera: Conjunctivae normal.     Pupils: Pupils are equal, round, and reactive to light.  Pulmonary:     Effort: Pulmonary effort is normal.  Abdominal:     General: Bowel sounds are normal.     Palpations: Abdomen is soft.  Musculoskeletal:     Cervical back: Normal range of motion and neck supple.  Skin:    General: Skin is warm and dry.      Capillary Refill: Capillary refill takes less than 2 seconds.  Neurological:     Mental Status: He is alert and oriented to person, place, and time.  Psychiatric:        Behavior: Behavior normal.      Musculoskeletal Exam: C-spine, thoracic spine, lumbar spine limited range of motion.  No midline spinal tenderness.  No SI joint tenderness.  Shoulder joint abduction to 90 degrees bilaterally.  Painful limited internal rotation of both shoulder joints noted.  Elbow joints have good range of motion.  He has tenderness of the left elbow joint on exam.  Wrist joints have limited flexion and extension bilaterally.  Tenderness of the right second and third MCPs and left second, third, and fourth MCP joints.  Left knee joint effusion noted.  He has inflammation and tenderness of the right knee joint.  Tenderness of both ankle joints noted.  CDAI Exam: CDAI Score: -- Patient Global: --; Provider Global: -- Swollen: 2 ; Tender: 12  Joint Exam 12/07/2019      Right  Left  Glenohumeral   Tender   Tender  Elbow      Tender  MCP 2   Tender   Tender  MCP 3   Tender   Tender  MCP 4      Tender  Knee  Swollen Tender  Swollen Tender  Ankle   Tender   Tender     Investigation: No additional findings.  Imaging: XR KNEE 3 VIEW LEFT  Result Date: 12/04/2019 Bone-on-bone joint space narrowing of the medial compartment.   Recent Labs: Lab Results  Component Value Date   WBC 13.6 (H) 11/07/2019   HGB 15.4 11/07/2019   PLT 254 11/07/2019   NA 142 11/07/2019   K 3.5 11/07/2019   CL 102 11/07/2019   CO2 28 11/07/2019   GLUCOSE 85 11/07/2019   BUN 24 11/07/2019   CREATININE 1.10 11/07/2019   BILITOT 0.5 11/07/2019   ALKPHOS 28 (L) 11/24/2017   AST 14 11/07/2019   ALT 18 11/07/2019   PROT 5.9 (L) 11/07/2019   ALBUMIN 3.0 (L) 11/24/2017   CALCIUM 9.4 11/07/2019   GFRAA 88 11/07/2019   QFTBGOLDPLUS NEGATIVE 07/12/2019    Speciality Comments: Prior therapy:  MTX, PLQ, Enbrel, and Humira  (inadequate response). Due to his history of diverticulitis, Harriette Ohara is not an option.  Procedures:  No procedures performed Allergies: Eggs or egg-derived products   Assessment / Plan:     Visit Diagnoses: Rheumatoid arthritis, seronegative, multiple sites [facility]): He has been experiencing recurrent left knee joint effusions.  He had a left knee joint aspiration on 11/07/2019 and 12/04/2019.  His right knee has warmth and inflammation on exam today.  He has been experiencing severe pain in both shoulder joints and has limited abduction to 90 degrees bilaterally.  He has tenderness of the left elbow joint and several MCP joints as described below.  He has tenderness of both ankle joints but no synovitis was noted.  He is currently on Orencia 125 mg of days injections once weekly, prednisone 10 mg by mouth daily, and Arava 20 mg by mouth daily.  According to the patient he experiences some relief in pain and inflammation for about 4 days after the Orencia injection but then his pain and inflammation returns.  He has had to increase his dose of prednisone from 5 mg daily to 10 mg by mouth daily.  We discussed switching from Orencia to Simponi subcu injections once every 28 days.  Indications, contraindications, potential side effects of Simponi were discussed today.  All questions were addressed and consent was obtained.  We will obtain baseline immunosuppressive lab work.  He will return for CBC and CMP in 1 month and every 3 months after starting on Simponi.  We will apply for [facility] through his insurance and he will return to the office for administration of the first injection.  He will continue taking Arava and prednisone as prescribed.  He will follow-up in the office in 6 weeks.  Medication counseling:   Baseline Immunosuppressant Therapy Labs TB GOLD Quantiferon TB Gold Latest Ref Rng & Units 07/12/2019  Quantiferon TB Gold Plus NEGATIVE NEGATIVE   Hepatitis Panel   HIV No results found for:  HIV Immunoglobulins   SPEP Serum Protein Electrophoresis Latest Ref Rng & Units 11/07/2019  Total Protein 6.1 - 8.1 g/dL 5.9(L)   G6PD No results found for: G6PDH TPMT No results found for: TPMT   Chest x-ray: Negative on 04/11/14  Does patient have diagnosis of heart failure?  No  Counseled patient that Simponi is a TNF blocking agent.  Reviewed Simponi dose of 50 mg once a month.  Counseled patient on purpose, proper use, and adverse effects of Simponi.  Reviewed the most common adverse effects including infections and injection site reactions.  Discussed that there is the possibility of an increased risk of malignancy but it is not well understood if this increased risk is due to the medication or the disease state.  Advised patient to get yearly dermatology exams due to risk of skin cancer.    Counseled patient that Simponi should be held for infections and prior to scheduled surgery.  Counseled patient to avoid live vaccines while on Simponi.  Advised patient to get annual influenza vaccine and the pneumococcal vaccine as indicated.    Reviewed the importance of regular labs while on Simponi therapy. Standing orders placed. Provided patient with medication education material and answered all questions.  Patient voiced understanding.  Patient consented to Simponi.  Will upload consent into the media tab.  Reviewed storage instructions for Simponi.  Advised initial injection must be administered in office.  Patient verbalized understanding.  Will apply for Simponi through patient's insurance and will update when we receive a response.  Patient dose will be Simponi 50 mg every 28 days.  Prescription pending lab results and/or insurance approval.  High risk medication use - Prednisone 10 mg po daily, Orencia 125 mg sq weekly injection,  and Arava 20 mg po daily (failed MTX, PLQ, Enbrel, and Humira in the past.)  Primary osteoarthritis of both hands:   Primary osteoarthritis of both knees:  Chronic pain.  He has been experiencing recurrent left knee joint effusions.  He had an aspiration on 11/07/2019 and 12/04/2019.  He is planning on proceeding with a left knee joint arthroplasty performed by Dr.Xu in November 2021.  He is wearing a compression brace today to help with his discomfort.  His right knee has been causing increased discomfort due to gait changes.  He has warmth and inflammation in the right knee joint on exam today.  He continues to take prednisone 10 mg by mouth daily.  Effusion, left knee -He had a left knee joint aspiration on 11/07/2019 and a repeat aspiration on 12/04/2019.  Synovial analysis revealed white blood cell count of 82.  No crystals.   Primary osteoarthritis of both feet: He has tenderness of both ankle joints.  DDD (degenerative disc disease), cervical: Chronic pain.  He has limited range of motion with discomfort.  DDD (degenerative disc disease), lumbar: Chronic pain. Limited range of motion with discomfort.  Spondylosis of lumbar region without myelopathy or radiculopathy: Chronic pain   History of colon polyps  History of diverticulitis  Orders: No orders of the defined types were placed in this encounter.  No orders of the defined types were placed in this encounter.   Face-to-face time spent with patient was 30 minutes. Greater than 50% of time was spent in counseling and coordination of care.  Follow-Up Instructions: Return in about 6 weeks (around 01/18/2020) for Rheumatoid arthritis, Osteoarthritis.   Ofilia Neas, PA-C   I examined and evaluated the patient with Hazel Sams PA.  He had synovitis on examination as described above.  Patient continues to have flares despite being on Orencia, Arava and prednisone combination.  We had detailed discussion regarding different treatment options.  We will apply for Simponi Aria infusions.  If approved we will change therapy.  I detailed discussion with the patient.  The plan  of care was discussed as  noted above.  Pollyann Savoy, MD Note - This record has been created using Animal nutritionist.  Chart creation errors have been sought, but may not always  have been located. Such creation errors do not reflect on  the standard of medical care.

## 2019-12-04 ENCOUNTER — Ambulatory Visit (INDEPENDENT_AMBULATORY_CARE_PROVIDER_SITE_OTHER): Payer: BC Managed Care – PPO | Admitting: Orthopaedic Surgery

## 2019-12-04 ENCOUNTER — Ambulatory Visit: Payer: Self-pay

## 2019-12-04 ENCOUNTER — Other Ambulatory Visit: Payer: Self-pay

## 2019-12-04 ENCOUNTER — Encounter: Payer: Self-pay | Admitting: Orthopaedic Surgery

## 2019-12-04 VITALS — Ht 74.5 in | Wt 270.0 lb

## 2019-12-04 DIAGNOSIS — M1712 Unilateral primary osteoarthritis, left knee: Secondary | ICD-10-CM | POA: Diagnosis not present

## 2019-12-04 MED ORDER — METHYLPREDNISOLONE ACETATE 40 MG/ML IJ SUSP
40.0000 mg | INTRAMUSCULAR | Status: AC | PRN
  Administered 2019-12-04: 11:00:00 40 mg via INTRA_ARTICULAR

## 2019-12-04 MED ORDER — BUPIVACAINE HCL 0.5 % IJ SOLN
2.0000 mL | INTRAMUSCULAR | Status: AC | PRN
  Administered 2019-12-04: 11:00:00 2 mL via INTRA_ARTICULAR

## 2019-12-04 MED ORDER — LIDOCAINE HCL 1 % IJ SOLN
2.0000 mL | INTRAMUSCULAR | Status: AC | PRN
  Administered 2019-12-04: 2 mL

## 2019-12-04 NOTE — Progress Notes (Signed)
Office Visit Note   Patient: Gregory Hinton           Date of Birth: Nov 21, 1965           MRN: [id number] Visit Date: 12/04/2019              Requested by: [facility], [place],  [place] [address] PCP: Gregory Hinton: Visit Diagnoses:  1. Primary osteoarthritis of left knee     Plan: Impression is advanced left knee DJD with bone-on-bone medial compartment joint space narrowing. Based on discussion patient agreed to undergo a repeat aspiration injection. We obtained 35 cc joint fluid from a superior lateral approach and cortisone was also injected. Patient tolerates well. Patient is leaning towards having her left knee replacement towards the end of November if he is able to make it that long. He only gets temporary relief from nonsurgical treatments. Follow-up as needed.  Follow-Up Instructions: Return if symptoms worsen or fail to improve.   Orders:  Orders Placed This Encounter  Procedures  . Large Joint Inj: L knee  . XR KNEE 3 VIEW LEFT  . Cell count + diff,  w/ cryst-synvl fld   No orders of the defined types were placed in this encounter.     Procedures: Large Joint Inj: L knee on 12/04/2019 11:16 AM Details: 22 G needle Medications: 2 mL bupivacaine 0.5 %; 2 mL lidocaine 1 %; 40 mg methylPREDNISolone acetate 40 MG/ML Aspirate: 35 mL blood-tinged; sent for lab analysis Outcome: tolerated well, no immediate complications Patient was prepped and draped in the usual sterile fashion.       Clinical Data: No additional findings.   Subjective: Chief Complaint  Patient presents with  . Left Knee - Pain    Gregory Hinton is a 54 year old gentleman comes in as referral from Dr. Estanislado Hinton for evaluation of chronic left knee pain that has been worsening over the last 2 months. Denies any injuries. He does have a history of RA and currently on Orencia. 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 He has difficulty sleeping due to nighttime pain as well. He is unable to walk any substantial distance. He underwent left knee aspiration injection by Gregory Hinton about a month ago and he did not notice much relief. He denies any numbness and tingling.   Review of Systems  Constitutional: Negative.   All other systems reviewed and are negative.    Objective: Vital Signs: Ht 6' 2.5" (1.892 m)   Wt 270 lb (122.5 kg)   BMI 34.20 kg/m   Physical Exam Vitals and nursing note reviewed.  Constitutional:      Appearance: He is well-developed.  HENT:     Head: Normocephalic and atraumatic.  Eyes:     Pupils: Pupils are equal, round, and reactive to light.  Pulmonary:     Effort: Pulmonary effort is normal.  Abdominal:     Palpations: Abdomen is soft.  Musculoskeletal:        General: Normal range of motion.     Cervical back: Neck supple.  Skin:    General: Skin is warm.  Neurological:     Mental Status: He is alert and oriented to person, place, and time.  Psychiatric:        Behavior: Behavior normal.        Thought Content: Thought  content normal.        Judgment: Judgment normal.     Ortho Exam Left knee exam shows a moderate joint effusion. Moderate restriction range of motion secondary to pain and effusion. Collaterals and cruciates are stable. Specialty Comments:  No specialty comments available.  Imaging: XR KNEE 3 VIEW LEFT  Result Date: 12/04/2019 Bone-on-bone joint space narrowing of the medial compartment.    PMFS History: Patient Active Problem List   Diagnosis Date Noted  . High risk medication use 04/19/2018  . History of diverticulitis 05/04/2017  . History of colon polyps 05/04/2017  . Primary osteoarthritis of both hands 05/04/2017  . Primary osteoarthritis of both feet 05/04/2017  . Primary osteoarthritis of both knees 04/02/2017  . Class 1 obesity without serious comorbidity with body mass index  (BMI) of 32.0 to 32.9 in adult 10/14/2016  . Spondylosis of lumbar region without myelopathy or radiculopathy 10/14/2016  . DJD (degenerative joint disease), cervical 10/14/2016  . High risk medications (not anticoagulants) long-term use 10/14/2016  . Rheumatoid arthritis, seronegative, multiple sites (HCC) 10/14/2016  . Lumbar stenosis with neurogenic claudication 12/17/2015   Past Medical History:  Diagnosis Date  . Adrenal insufficiency (HCC)   . Anxiety   . Arthritis   . Back pain    had 3 fusions surgeries  . H/O cardiovascular stress test 2014   admitted at hosp. overnight for chest pain, stress/echo test wnl  . Hypertension   . PONV (postoperative nausea and vomiting)   . Rheumatoid arthritis (HCC)    manages by Gregory Hinton, rec'd Orencia weekly for 2 months, switched from Humira     Family History  Problem Relation Age of Onset  . Aneurysm Mother   . Leukemia Father   . Healthy Daughter   . Healthy Son     Past Surgical History:  Procedure Laterality Date  . ANTERIOR LAT LUMBAR FUSION Left 12/26/2018   Procedure: Left Lumbar Two-Three Anterolateral decompression/interbody fusion/lateral plate;  Surgeon: Barnett Abu, MD;  Location: [facility] OR;  Service: Neurosurgery;  Laterality: Left;  Anterolateral  . BACK SURGERY  [id number]   lumbar fusion 3 times  . CARPAL TUNNEL RELEASE Right 2000   and left side 2 years ago with left ulnar surgery  . COLONOSCOPY    . HERNIA REPAIR Right 2001   inguinal    Social History   Occupational History  . Not on file  Tobacco Use  . Smoking status: Former Smoker    Quit date: 02/18/1998    Years since quitting: 21.8  . Smokeless tobacco: Never Used  Substance and Sexual Activity  . Alcohol use: Yes    Comment: rarely  . Drug use: No  . Sexual activity: Not on file

## 2019-12-05 LAB — TIQ-NTM

## 2019-12-05 LAB — SYNOVIAL CELL COUNT + DIFF, W/ CRYSTALS
Basophils, %: 0 %
Eosinophils-Synovial: 0 % (ref 0–2)
Lymphocytes-Synovial Fld: 56 % (ref 0–74)
Monocyte/Macrophage: 8 % (ref 0–69)
Neutrophil, Synovial: 30 % — ABNORMAL HIGH (ref 0–24)
Synoviocytes, %: 6 % (ref 0–15)
WBC, Synovial: 82 cells/uL (ref <150)

## 2019-12-05 NOTE — Progress Notes (Signed)
Fluid just shows arthritis fluid

## 2019-12-07 ENCOUNTER — Other Ambulatory Visit: Payer: Self-pay

## 2019-12-07 ENCOUNTER — Ambulatory Visit (INDEPENDENT_AMBULATORY_CARE_PROVIDER_SITE_OTHER): Payer: BC Managed Care – PPO | Admitting: Rheumatology

## 2019-12-07 ENCOUNTER — Telehealth: Payer: Self-pay | Admitting: Pharmacist

## 2019-12-07 ENCOUNTER — Encounter: Payer: Self-pay | Admitting: Rheumatology

## 2019-12-07 ENCOUNTER — Ambulatory Visit: Payer: BC Managed Care – PPO | Admitting: Physician Assistant

## 2019-12-07 VITALS — BP 143/94 | HR 84 | Resp 16 | Ht 76.0 in | Wt 278.0 lb

## 2019-12-07 DIAGNOSIS — M0609 Rheumatoid arthritis without rheumatoid factor, multiple sites: Secondary | ICD-10-CM | POA: Diagnosis not present

## 2019-12-07 DIAGNOSIS — M170 Bilateral primary osteoarthritis of knee: Secondary | ICD-10-CM | POA: Diagnosis not present

## 2019-12-07 DIAGNOSIS — M19071 Primary osteoarthritis, right ankle and foot: Secondary | ICD-10-CM

## 2019-12-07 DIAGNOSIS — M25462 Effusion, left knee: Secondary | ICD-10-CM

## 2019-12-07 DIAGNOSIS — M19041 Primary osteoarthritis, right hand: Secondary | ICD-10-CM | POA: Diagnosis not present

## 2019-12-07 DIAGNOSIS — M19042 Primary osteoarthritis, left hand: Secondary | ICD-10-CM

## 2019-12-07 DIAGNOSIS — Z86010 Personal history of colonic polyps: Secondary | ICD-10-CM

## 2019-12-07 DIAGNOSIS — Z8719 Personal history of other diseases of the digestive system: Secondary | ICD-10-CM

## 2019-12-07 DIAGNOSIS — M5136 Other intervertebral disc degeneration, lumbar region: Secondary | ICD-10-CM

## 2019-12-07 DIAGNOSIS — Z79899 Other long term (current) drug therapy: Secondary | ICD-10-CM | POA: Diagnosis not present

## 2019-12-07 DIAGNOSIS — M5030 Other cervical disc degeneration, unspecified cervical region: Secondary | ICD-10-CM

## 2019-12-07 DIAGNOSIS — M47816 Spondylosis without myelopathy or radiculopathy, lumbar region: Secondary | ICD-10-CM

## 2019-12-07 DIAGNOSIS — M19072 Primary osteoarthritis, left ankle and foot: Secondary | ICD-10-CM

## 2019-12-07 NOTE — Progress Notes (Signed)
Pharmacy Note  Subjective: Patient presents today to [facility] for follow up office visit.  Patient seen by the pharmacist for counseling on rheumatoid arthritis. He is currently on Orencia 125 mg weekly and Arava 20 mg daily with inadequate response. Prior therapy:  MTX, PLQ, Enbrel, and Humira (inadequate response). Due to his history of diverticulitis, Gregory Hinton is not an option.  Objective:  CBC    Component Value Date/Time   WBC 13.6 (H) 11/07/2019 1114   RBC 5.08 11/07/2019 1114   HGB 15.4 11/07/2019 1114   HCT 45.4 11/07/2019 1114   PLT 254 11/07/2019 1114   MCV 89.4 11/07/2019 1114   MCH 30.3 11/07/2019 1114   MCHC 33.9 11/07/2019 1114   RDW 13.3 11/07/2019 1114   LYMPHSABS 3,074 11/07/2019 1114   MONOABS 0.9 06/21/2018 2041   EOSABS 54 11/07/2019 1114   BASOSABS 54 11/07/2019 1114     CMP     Component Value Date/Time   NA 142 11/07/2019 1114   K 3.5 11/07/2019 1114   CL 102 11/07/2019 1114   CO2 28 11/07/2019 1114   GLUCOSE 85 11/07/2019 1114   BUN 24 11/07/2019 1114   CREATININE 1.10 11/07/2019 1114   CALCIUM 9.4 11/07/2019 1114   PROT 5.9 (L) 11/07/2019 1114   ALBUMIN 3.0 (L) 11/24/2017 2013   AST 14 11/07/2019 1114   ALT 18 11/07/2019 1114   ALKPHOS 28 (L) 11/24/2017 2013   BILITOT 0.5 11/07/2019 1114   GFRNONAA 76 11/07/2019 1114   GFRAA 88 11/07/2019 1114     Baseline Immunosuppressant Therapy Labs TB GOLD Quantiferon TB Gold Latest Ref Rng & Units 07/12/2019  Quantiferon TB Gold Plus NEGATIVE NEGATIVE   Hepatitis Panel   HIV No results found for: HIV Immunoglobulins   SPEP Serum Protein Electrophoresis Latest Ref Rng & Units 11/07/2019  Total Protein 6.1 - 8.1 g/dL 5.9(L)   G6PD No results found for: G6PDH TPMT No results found for: TPMT   Chest x-ray Negative exam 04/11/2014  Does patient have diagnosis of heart failure?  No  Assessment/Plan: Counseled patient that Simponi is a TNF blocking agent. Counseled patient on  purpose, proper use, and adverse effects of Simponi.  Reviewed the most common adverse effects including infections and injection site reactions.  Discussed that there is the possibility of an increased risk of malignancy but it is not well understood if this increased risk is due to the medication or the disease state.  Advised patient to get yearly dermatology exams due to risk of skin cancer.    Counseled patient that Simponi should be held for infections and prior to scheduled surgery.  Counseled patient to avoid live vaccines while on Simponi.  Advised patient to get annual influenza vaccine and the pneumococcal vaccine as indicated.    Reviewed the importance of regular labs while on Simponi therapy. Standing orders placed. Provided patient with medication education material and answered all questions.  Patient voiced understanding.  Patient consented to Simponi.  Will upload consent into the media tab.  Reviewed storage instructions for Simponi.  Advised initial injection must be administered in office.  Patient verbalized understanding.  Will apply for Simponi through patient's insurance and will update when we receive a response.  Patient dose will be Simponi 50 mg every 28 days.  Prescription pending lab results and/or insurance approval.  All questions encouraged and answered.  Instructed patient to call with any questions or concerns.  Mariella Saa, PharmD, [place], [place] Clinical Specialty Pharmacist [phone]  12/07/2019 9:53 AM

## 2019-12-07 NOTE — Telephone Encounter (Signed)
Submitted a Prior Authorization request to [facility] for [facility] via Cover My Meds. Will update once we receive a response.  Key: [id number]  10:29 AM Dorthula Nettles, CPhT

## 2019-12-07 NOTE — Telephone Encounter (Addendum)
Please start benefits investigation for Simponi for the treatment of rheumatoid arthritis. He is currently on Orencia 125 mg weekly and Arava 20 mg daily with inadequate response. Prior therapy:  MTX, PLQ, Enbrel, and Humira (inadequate response). Due to his history of diverticulitis, Harriette Ohara is not an option.  He has Nurse, learning disability and could be co-pay eligible.     Verlin Fester, PharmD, [place], CPP Clinical Specialty Pharmacist [phone]  12/07/2019 9:56 AM

## 2019-12-08 NOTE — Telephone Encounter (Signed)
Called [employer] PA line- rep Silvio Pate advised that they are still loading PA into the system, states they have end of day on 3/7 to get it completed. Will run test claim on Monday.

## 2019-12-08 NOTE — Telephone Encounter (Signed)
Received notification from [facility] regarding a prior authorization for [facility] Authorization has been APPROVED from 12/07/19 to 12/05/22.   Claim not processing, will call plan later today.

## 2019-12-11 ENCOUNTER — Telehealth: Payer: Self-pay | Admitting: Rheumatology

## 2019-12-11 NOTE — Telephone Encounter (Signed)
Ran test claim, patient's copay for 1 month supply is $649.73. Patient is eligible for a copay card.

## 2019-12-11 NOTE — Telephone Encounter (Signed)
Patient left a voicemail stating [employer] approved his new medication and requested a return call.

## 2019-12-12 LAB — PROTEIN ELECTROPHORESIS, SERUM, WITH REFLEX
Albumin ELP: 4.1 g/dL (ref 3.8–4.8)
Alpha 1: 0.3 g/dL (ref 0.2–0.3)
Alpha 2: 0.8 g/dL (ref 0.5–0.9)
Beta 2: 0.3 g/dL (ref 0.2–0.5)
Beta Globulin: 0.4 g/dL (ref 0.4–0.6)
Gamma Globulin: 0.5 g/dL — ABNORMAL LOW (ref 0.8–1.7)
Total Protein: 6.4 g/dL (ref 6.1–8.1)

## 2019-12-12 LAB — IGG, IGA, IGM
IgG (Immunoglobin G), Serum: 500 mg/dL — ABNORMAL LOW (ref 600–1640)
IgM, Serum: 53 mg/dL (ref 50–300)
Immunoglobulin A: 152 mg/dL (ref 47–310)

## 2019-12-12 LAB — HEPATITIS PANEL, ACUTE
Hep A IgM: NONREACTIVE
Hep B C IgM: NONREACTIVE
Hepatitis B Surface Ag: NONREACTIVE
Hepatitis C Ab: NONREACTIVE
SIGNAL TO CUT-OFF: 0.01 (ref <1.00)

## 2019-12-12 LAB — IFE INTERPRETATION: Immunofix Electr Int: NOT DETECTED

## 2019-12-12 LAB — HIV ANTIBODY (ROUTINE TESTING W REFLEX): HIV 1&2 Ab, 4th Generation: NONREACTIVE

## 2019-12-12 NOTE — Telephone Encounter (Signed)
Called to notify patient of approval.  His last Orencia injection was Sunday 3/7.  We need to wait at least 1 week after his last Orencia dose prior to starting Simponi.  He still has labs pending.  Patient preferred to schedule an appointment for Monday 3/15 at 9 AM.  Advised that appointment may need to be rescheduled pending lab results.  Patient verbalized understanding.  Verlin Fester, PharmD, [place], CPP Clinical Specialty Pharmacist [phone]  12/12/2019 9:38 AM

## 2019-12-12 NOTE — Telephone Encounter (Signed)
Documented in another telephone encounter.  Nothing further needed.   Verlin Fester, PharmD, [place], CPP Clinical Specialty Pharmacist [phone]  12/12/2019 9:35 AM

## 2019-12-12 NOTE — Addendum Note (Signed)
Addended by: Verlin Fester C on: 12/12/2019 09:40 AM   Modules accepted: Orders

## 2019-12-13 NOTE — Progress Notes (Signed)
Hepatitis panel negative, HIV negative, and IFE did not reveal any monoclonal proteins.

## 2019-12-18 ENCOUNTER — Ambulatory Visit (INDEPENDENT_AMBULATORY_CARE_PROVIDER_SITE_OTHER): Payer: BC Managed Care – PPO | Admitting: Pharmacist

## 2019-12-18 ENCOUNTER — Other Ambulatory Visit: Payer: Self-pay

## 2019-12-18 VITALS — BP 125/79 | HR 87

## 2019-12-18 DIAGNOSIS — M0609 Rheumatoid arthritis without rheumatoid factor, multiple sites: Secondary | ICD-10-CM

## 2019-12-18 DIAGNOSIS — Z7189 Other specified counseling: Secondary | ICD-10-CM | POA: Diagnosis not present

## 2019-12-18 MED ORDER — GOLIMUMAB 50 MG/0.5ML ~~LOC~~ SOAJ: 50.0000 mg | SUBCUTANEOUS | 0 refills | Status: DC

## 2019-12-18 NOTE — Patient Instructions (Signed)
Remember the 5 C's:  COUNTER- leave on the counter at least 30 mins but up to overnight to bring medication to room temperature and prevent stinging  COLD- Placing something cold (like and ice gel pack or cold water bottle) on the injection site just before cleansing with alcohol may help reduce pain  CLARITIN- for the first two weeks of treatment or  the day of, the day before, and the day after injecting to minimize injection site reactions  CORTISONE CREAM- apply if injection site is irritated and itching  CALL ME- if injection site reaction is bigger than the size of your fist, looks infected, blisters, or develop hives   

## 2019-12-18 NOTE — Progress Notes (Signed)
Pharmacy Note  Subjective:   Patient is being initiated on Simponi.  Patient was previously counseled extensively on and consented to initiation of Simponi at that time.  Patient presents to clinic today to receive the first dose of Simponi.     Objective: CMP     Component Value Date/Time   NA 142 11/07/2019 1114   K 3.5 11/07/2019 1114   CL 102 11/07/2019 1114   CO2 28 11/07/2019 1114   GLUCOSE 85 11/07/2019 1114   BUN 24 11/07/2019 1114   CREATININE 1.10 11/07/2019 1114   CALCIUM 9.4 11/07/2019 1114   PROT 6.4 12/07/2019 0952   ALBUMIN 3.0 (L) 11/24/2017 2013   AST 14 11/07/2019 1114   ALT 18 11/07/2019 1114   ALKPHOS 28 (L) 11/24/2017 2013   BILITOT 0.5 11/07/2019 1114   GFRNONAA 76 11/07/2019 1114   GFRAA 88 11/07/2019 1114    CBC    Component Value Date/Time   WBC 13.6 (H) 11/07/2019 1114   RBC 5.08 11/07/2019 1114   HGB 15.4 11/07/2019 1114   HCT 45.4 11/07/2019 1114   PLT 254 11/07/2019 1114   MCV 89.4 11/07/2019 1114   MCH 30.3 11/07/2019 1114   MCHC 33.9 11/07/2019 1114   RDW 13.3 11/07/2019 1114   LYMPHSABS 3,074 11/07/2019 1114   MONOABS 0.9 06/21/2018 2041   EOSABS 54 11/07/2019 1114   BASOSABS 54 11/07/2019 1114    Baseline Immunosuppressant Therapy Labs TB GOLD Quantiferon TB Gold Latest Ref Rng & Units 07/12/2019  Quantiferon TB Gold Plus NEGATIVE NEGATIVE   Hepatitis Panel Hepatitis Latest Ref Rng & Units 12/07/2019  Hep B Surface Ag NON-REACTI NON-REACTIVE  Hep B IgM NON-REACTI NON-REACTIVE  Hep C Ab NON-REACTI NON-REACTIVE  Hep C Ab NON-REACTI NON-REACTIVE  Hep A IgM NON-REACTI NON-REACTIVE   HIV Lab Results  Component Value Date   HIV NON-REACTIVE 12/07/2019   Immunoglobulins Immunoglobulin Electrophoresis Latest Ref Rng & Units 12/07/2019  IgA  47 - 310 mg/dL 093  IgG 818 - 2,993 mg/dL 716(R)  IgM 50 - 678 mg/dL 53   SPEP Serum Protein Electrophoresis Latest Ref Rng & Units 12/07/2019  Total Protein 6.1 - 8.1 g/dL 6.4  Albumin 3.8  - 4.8 g/dL 4.1  Alpha-1 0.2 - 0.3 g/dL 0.3  Alpha-2 0.5 - 0.9 g/dL 0.8  Beta Globulin 0.4 - 0.6 g/dL 0.4  Beta 2 0.2 - 0.5 g/dL 0.3  Gamma Globulin 0.8 - 1.7 g/dL 9.3(Y)   B0FB No results found for: G6PDH TPMT No results found for: TPMT   Chest x-ray: negative exam on 04/11/2014  Patient running a fever or have signs/symptoms of infection? No  Assessment/Plan:  Demonstrated proper injection technique with Simponi demo pen.  Patient able to demonstrate proper injection technique using the teach back method. Patient self injected in the lower right abdomen with:  Sample Medication: Simponi SmartJect 50 mg/0.5 ml NDC: 51025-852-77 Lot: [id number] Expiration: 07/2020  Patient tolerated well.  Observed for 30 mins in office for adverse reaction and none noted.   Patient is to return in 1 month for follow up appointment and labs.  Standing orders placed. Prescription sent to [facility] per patient's request.  He signed up for co-pay card while in office and processing information is below: BIN- [id number]  All questions encouraged and answered.  Instructed patient to call with any further questions or concerns.   Verlin Fester, PharmD, [place], CPP Clinical Specialty Pharmacist [phone]  12/18/2019 9:24 AM

## 2020-01-08 NOTE — Progress Notes (Signed)
Office Visit Note  Patient: Gregory Hinton             Date of Birth: 08-08-1966           MRN: [id number]             PCP: [facility] Referring: [facility] Visit Date: 01/18/2020 Occupation: @GUAROCC @  Subjective:  Right wrist pain and swelling   History of Present Illness: Gregory Hinton is a 54 y.o. male with history of seronegative rheumatoid arthritis and osteoarthritis.  He continues to take Arava 20 mg 1 tablet by mouth daily and prednisone 5 mg by mouth daily.  He was started on Simponi subcu injections on 12/18/2019.  He had his second injection 4 days ago.  He states that after each injection he experienced flulike symptoms.  He was having arthralgias and myalgias as well as significant fatigue.  He states that after the second injection he also developed tingling and itching around his mouth.  He presents today to discuss other treatment options.  He is experiencing pain and inflammation in multiple joints.  He is having severe pain and swelling in the right wrist.  He states that he has had significant difficulty with ADLs.  He has been unable to wash his hands without difficulty.  He has very limited range of motion of the right wrist and states that on March 22ng he was pulling an 65 lb object and felt a pop and sharp pain in his right forearm and noticed bruising on the volar aspect of the right forearm.  He states the bruising has since resolved but he continues to have pain and swelling.  He is also had increased discomfort in the left knee joint which she knows needs replacement but is not ready to proceed with surgery at this time.  He continues to have chronic pain in both shoulder joints as well.   Activities of Daily Living:  Patient reports joint stiffness all day  Patient Reports nocturnal pain.  Difficulty dressing/grooming: Reports Difficulty climbing stairs: Reports Difficulty getting out of chair: Reports Difficulty  using hands for taps, buttons, cutlery, and/or writing: Reports  Review of Systems  Constitutional: Positive for fatigue.  HENT: Negative for mouth sores, mouth dryness and nose dryness.   Eyes: Negative for itching and dryness.  Respiratory: Negative for shortness of breath and difficulty breathing.   Cardiovascular: Positive for swelling in legs/feet. Negative for chest pain and palpitations.  Gastrointestinal: Negative for blood in stool, constipation and diarrhea.  Endocrine: Negative for heat intolerance.  Genitourinary: Negative for difficulty urinating and painful urination.  Musculoskeletal: Positive for arthralgias, joint pain, joint swelling, myalgias, morning stiffness, muscle tenderness and myalgias.  Skin: Negative for color change, rash, skin tightness and sensitivity to sunlight.  Allergic/Immunologic: Negative for susceptible to infections.  Neurological: Positive for headaches. Negative for dizziness, numbness and memory loss.  Psychiatric/Behavioral: Positive for sleep disturbance. Negative for confusion.    PMFS History:  Patient Active Problem List   Diagnosis Date Noted  . High risk medication use 04/19/2018  . History of diverticulitis 05/04/2017  . History of colon polyps 05/04/2017  . Primary osteoarthritis of both hands 05/04/2017  . Primary osteoarthritis of both feet 05/04/2017  . Primary osteoarthritis of both knees 04/02/2017  . Class 1 obesity without serious comorbidity with body mass index (BMI) of 32.0 to 32.9 in adult 10/14/2016  . Spondylosis of lumbar region without myelopathy or radiculopathy 10/14/2016  .  DJD (degenerative joint disease), cervical 10/14/2016  . High risk medications (not anticoagulants) long-term use 10/14/2016  . Rheumatoid arthritis, seronegative, multiple sites (HCC) 10/14/2016  . Lumbar stenosis with neurogenic claudication 12/17/2015    Past Medical History:  Diagnosis Date  . Adrenal insufficiency (HCC)   . Anxiety   .  Arthritis   . Back pain    had 3 fusions surgeries  . H/O cardiovascular stress test 2014   admitted at hosp. overnight for chest pain, stress/echo test wnl  . Hypertension   . PONV (postoperative nausea and vomiting)   . Rheumatoid arthritis (HCC)    manages by Dr. Corliss Skains, rec'd Orencia weekly for 2 months, switched from Humira     Family History  Problem Relation Age of Onset  . Aneurysm Mother   . Leukemia Father   . Healthy Daughter   . Healthy Son    Past Surgical History:  Procedure Laterality Date  . ANTERIOR LAT LUMBAR FUSION Left 12/26/2018   Procedure: Left Lumbar Two-Three Anterolateral decompression/interbody fusion/lateral plate;  Surgeon: Barnett Abu, MD;  Location: [facility] OR;  Service: Neurosurgery;  Laterality: Left;  Anterolateral  . BACK SURGERY  [id number]   lumbar fusion 3 times  . CARPAL TUNNEL RELEASE Right 2000   and left side 2 years ago with left ulnar surgery  . COLONOSCOPY    . HERNIA REPAIR Right 2001   inguinal    Social History   Social History Narrative  . Not on file   Immunization History  Administered Date(s) Administered  . Influenza-Unspecified 05/05/2014  . Pneumococcal Polysaccharide-23 04/25/2014  . Zoster 04/25/2014     Objective: Vital Signs: BP (!) 143/90 (BP Location: Left Arm, Patient Position: Sitting, Cuff Size: Normal)   Pulse 88   Resp 16   Ht 6\' 4"  (1.93 m)   Wt 281 lb (127.5 kg)   BMI 34.20 kg/m    Physical Exam Vitals and nursing note reviewed.  Constitutional:      Appearance: He is well-developed.  HENT:     Head: Normocephalic and atraumatic.  Eyes:     Conjunctiva/sclera: Conjunctivae normal.     Pupils: Pupils are equal, round, and reactive to light.  Pulmonary:     Effort: Pulmonary effort is normal.  Abdominal:     General: Bowel sounds are normal.     Palpations: Abdomen is soft.  Musculoskeletal:     Cervical back: Normal range of motion and neck supple.  Skin:    General: Skin is warm and  dry.     Capillary Refill: Capillary refill takes less than 2 seconds.  Neurological:     Mental Status: He is alert and oriented to person, place, and time.  Psychiatric:        Behavior: Behavior normal.      Musculoskeletal Exam: C-spine, thoracic, and lumbar spine limited ROM.  Shoulder joint abduction to about 90 degrees bilaterally.  Elbow joints good ROM.  Right wrist flexor tenosynovitis noted. Painful and extremely limited ROM of the right wrist joint.  Left wrist has slightly limited flexion and extension.  MCPs, PIPs, and DIPs good ROM with no synovitis.  Left knee joint warmth and small effusion noted.  Right knee has good ROM with no warmth or effusion.  Ankle joints good ROM with no discomfort.   CDAI Exam: CDAI Score: 12.6  Patient Global: 8 mm; Provider Global: 8 mm Swollen: 2 ; Tender: 10  Joint Exam 01/18/2020      Right  Left  Glenohumeral   Tender   Tender  Wrist  Swollen Tender     MCP 1   Tender     MCP 2   Tender   Tender  MCP 3   Tender   Tender  Knee     Swollen Tender  Tarsometatarsal   Tender        Investigation: No additional findings.  Imaging: XR Wrist Complete Right  Result Date: 01/18/2020 No radiocarpal joint space narrowing was noted.  No fracture was noted. Impression: Unremarkable x-ray of the wrist joint.  XR Foot 2 Views Left  Result Date: 01/18/2020 Juxta-articular osteopenia was noted.  PIP and DIP narrowing was noted.  Narrowing of MTP joints with subluxation was noted.  Cystic changes were noted.  No intertarsal tibiotalar joint space narrowing was noted. Impression: These findings are consistent with rheumatoid arthritis and osteoarthritis overlap.  XR Foot 2 Views Right  Result Date: 01/18/2020 Inferior calcaneal spur was noted.  No tibiotalar or subtalar joint space narrowing was noted.  Narrowing of all MTP joints with subluxation was noted.  Cystic changes were noted in the MTPs.  PIP and DIP narrowing was noted. Impression:  These findings are consistent with rheumatoid arthritis and osteoarthritis overlap.  XR Hand 2 View Left  Result Date: 01/18/2020 Juxta-articular osteopenia was noted.  First second and third MCP joint narrowing was noted.  Cystic versus erosive change was noted in the second MCP joint.  PIP narrowing was noted.  No intercarpal radiocarpal joint space narrowing was noted. Impression: These findings are consistent with rheumatoid arthritis and osteoarthritis overlap.  XR Hand 2 View Right  Result Date: 01/18/2020 Right first and second MCP narrowing was noted.  Juxta-articular osteopenia was noted.  Mild PIP narrowing was noted.  No intercarpal radiocarpal joint space narrowing was noted.  No erosive changes were noted. Impression: These findings are consistent with rheumatoid arthritis and osteoarthritis overlap.   Recent Labs: Lab Results  Component Value Date   WBC 13.6 (H) 11/07/2019   HGB 15.4 11/07/2019   PLT 254 11/07/2019   NA 142 11/07/2019   K 3.5 11/07/2019   CL 102 11/07/2019   CO2 28 11/07/2019   GLUCOSE 85 11/07/2019   BUN 24 11/07/2019   CREATININE 1.10 11/07/2019   BILITOT 0.5 11/07/2019   ALKPHOS 28 (L) 11/24/2017   AST 14 11/07/2019   ALT 18 11/07/2019   PROT 6.4 12/07/2019   ALBUMIN 3.0 (L) 11/24/2017   CALCIUM 9.4 11/07/2019   GFRAA 88 11/07/2019   QFTBGOLDPLUS NEGATIVE 07/12/2019    Speciality Comments: Prior therapy: MTX, PLQ, Enbrel, Humira, and Orencia (inadequate response). Due to his history of diverticulitis, Morrie Sheldon is not an option.  Procedures:  No procedures performed Allergies: Eggs or egg-derived products   Assessment / Plan:     Visit Diagnoses: Rheumatoid arthritis, seronegative, multiple sites ([place]) -He has tenderness and flexor tenosynovitis of the right wrist joint on exam.  He has chronic left knee joint pain and an ongoing joint effusion on exam.  He has been experiencing increased arthralgias and joint stiffness.  He was started on  Simponi sq injections on 12/18/2019.  He had his second injection 4 days ago.  He has experienced severe flulike symptoms as well as oral irritation after each injection. He will discontinue Simponi.  In the past he had an inadequate response to Orencia, methotrexate, Plaquenil, Enbrel, and Humira.  He has a previous history of diverticulitis but reports that his last flare was  over 18 years ago. We discussed switching him to Cimzia 200 mg sq injections every 14 days.  Indications, contraindications, potential side effects of Cimzia were discussed today.  All questions were addressed and consent was obtained.  We will apply for Cimzia through his insurance.  He will continue taking Arava 20 mg 1 tablet by mouth daily.  A prednisone taper starting at 20 mg tapering by 5 mg every week was sent to the pharmacy today.  We discussed that if he has an inadequate response or cannot tolerate Cimzia we will try to obtain clearance from his GI specialist to possibly start on a Jak inhibitor in the future.  X-rays of both hands and feet were obtained today which revealed findings consistent with osteoarthritis and rheumatoid arthritis overlap.  A complete right wrist x-ray was also obtained which was unremarkable.  We will proceed with scheduling an MRI of the right wrist joint.  He plans on following up with Dr. Roda Shutters depending on the MRI results.  He will follow up in our office in 6 weeks. Plan: XR Hand 2 View Left, XR Hand 2 View Right, XR Foot 2 Views Left, XR Foot 2 Views Right, XR Wrist Complete Right  Counseled patient that Cimzia is a TNF blocking agent.  Counseled patient on purpose, proper use, and adverse effects of Cimzia.  Reviewed the most common adverse effects including infections, headache, and injection site reactions. Discussed that there is the possibility of an increased risk of malignancy but it is not well understood if this increased risk is due to the medication or the disease state.  Advised patient to  get yearly dermatology exams due to risk of skin cancer.  Counseled patient that Cimzia should be held prior to scheduled surgery.  Counseled patient to avoid live vaccines while on Cimzia.  Recommend annual influenza, Pneumovax 23, Prevnar 13, and Shingrix as indicated.   Reviewed the importance of regular labs while on Cimzia therapy. Standing orders placed. Provided patient with medication education material and answered all questions.  Patient voiced understanding.  Patient consented to Cimzia.  Will upload consent into the media tab.  Reviewed storage instructions for Cimzia.  Will apply for Cimzia through patient's insurance.  Advised initial injection must be administered in office.    Dose will be Cimzia 400 mg at weeks 0,2,4 then Cimzia 200 mg every 14 days  Prescription pending labs and insurance approval.  High risk medication use -  Applying for Cimzia sq injections. He will continue taking Arava 20 mg po daily.  (failed Orencia, MTX, PLQ, Enbrel, and Humira in the past.  D/c Simponi due to flu-like symptoms)   Pain in right wrist - He presents today with right wrist flexor tenosynovitis.  He has painful and very limited ROM of the right wrist joint on exam.  On 12/25/19 he was pulling a 65lb object at work and felt a pop and sharp pain in his right forearm.  He developed bruising on the volar aspect of the right forearm, which has since resolved (pictures were reviewed).  He has had significant difficulty with ADLs.  His pain has been severe and has brought him to tears due to the severity. X-rays of the right wrist were obtained today which were unremarkable.  We will proceed with scheduling a MRI of the right wrist joint.  He will follow up with Dr. Roda Shutters for further evaluation depending on the MRI results.  A prednisone taper starting at 20 mg tapering by 5  mg every week as sent to the pharmacy. Plan: XR Wrist Complete Right  Primary osteoarthritis of both hands: He has PIP and DIP thickening  consistent with osteoarthritis of both hands. He has been experiencing increased pain in both hands.  Joint protection and muscle strengthening were discussed.   Primary osteoarthritis of both knees: Chronic pain in both knee joints.  He has been experiencing severe pain in the left knee joint.  He has tried alternating ice, heat, and use a compression brace.  He is not ready to proceed with a knee replacement at this time.  He follows up with Dr. Roda Shutters.  Effusion, left knee - Left knee joint aspiration on 11/07/2019 and a repeat aspiration on 12/04/2019.  Synovial analysis revealed white blood cell count of 82.  No crystals. He has ongoing warmth and an effusion in the left knee joint.   Primary osteoarthritis of both feet: He has been experiencing increased discomfort on the dorsal aspect of the right foot.   DDD (degenerative disc disease), cervical: He has limited ROM of the C-spine on exam.  He has no symptoms of radiculopathy at this time.   DDD (degenerative disc disease), lumbar: Chronic pain   Spondylosis of lumbar region without myelopathy or radiculopathy: Chronic pain  Other medical conditions are listed as follows:   History of colon polyps  History of diverticulitis    Orders: Orders Placed This Encounter  Procedures  . XR Hand 2 View Left  . XR Hand 2 View Right  . XR Foot 2 Views Left  . XR Foot 2 Views Right  . XR Wrist Complete Right  . MR WRIST RIGHT WO CONTRAST   Meds ordered this encounter  Medications  . predniSONE (DELTASONE) 5 MG tablet    Sig: Take 4 tablets by mouth daily x1wk, 3 tablets by mouth daily x1wk, 2 tablets by mouth daily x1wk, 1 tablet by mouth daily x1wk.    Dispense:  70 tablet    Refill:  0    Face-to-face time spent with patient was 30 minutes. Greater than 50% of time was spent in counseling and coordination of care.  Follow-Up Instructions: Return in about 6 weeks (around 02/29/2020) for Rheumatoid arthritis.   Gearldine Bienenstock,  PA-C  Note - This record has been created using Dragon software.  Chart creation errors have been sought, but may not always  have been located. Such creation errors do not reflect on  the standard of medical care.

## 2020-01-16 ENCOUNTER — Telehealth: Payer: Self-pay | Admitting: Rheumatology

## 2020-01-16 NOTE — Telephone Encounter (Signed)
Patient took second dose of Simpona Sunday. Patient has been feeling tired (exhausted not just tired), joints are hurting worse, around mouth turned red and itched last pm. Patient has a rov scheduled for this Thursday, and does not know if he needs to keep appointment. Please call to advise.

## 2020-01-16 NOTE — Telephone Encounter (Signed)
Patient states he had his second Simponi injection. Patient states he is experiencing extreme fatigue, increased pain. Patient states the pain in his wrist has increased since starting the Simponi. Patient states he had had his second dose on  01/14/20. Patient states he had itching around his mouth last evening. Patient states that has subsided. Patient dies have a follow up appointment 01/18/20.

## 2020-01-16 NOTE — Telephone Encounter (Signed)
With his history of diverticulitis the options are very limited.  If he is having a reaction to Symphony should not take it anymore.  We can apply for Cimzia. Cimzia will be another good choice for him.  We can apply for it when he comes for follow-up appointment on the 15th.

## 2020-01-18 ENCOUNTER — Ambulatory Visit: Payer: BC Managed Care – PPO | Admitting: Physician Assistant

## 2020-01-18 ENCOUNTER — Ambulatory Visit: Payer: Self-pay

## 2020-01-18 ENCOUNTER — Encounter: Payer: Self-pay | Admitting: Physician Assistant

## 2020-01-18 ENCOUNTER — Other Ambulatory Visit: Payer: Self-pay

## 2020-01-18 VITALS — BP 143/90 | HR 88 | Resp 16 | Ht 76.0 in | Wt 281.0 lb

## 2020-01-18 DIAGNOSIS — M19041 Primary osteoarthritis, right hand: Secondary | ICD-10-CM

## 2020-01-18 DIAGNOSIS — M47816 Spondylosis without myelopathy or radiculopathy, lumbar region: Secondary | ICD-10-CM

## 2020-01-18 DIAGNOSIS — M170 Bilateral primary osteoarthritis of knee: Secondary | ICD-10-CM

## 2020-01-18 DIAGNOSIS — M5136 Other intervertebral disc degeneration, lumbar region: Secondary | ICD-10-CM

## 2020-01-18 DIAGNOSIS — Z860100 Personal history of colon polyps, unspecified: Secondary | ICD-10-CM

## 2020-01-18 DIAGNOSIS — Z8719 Personal history of other diseases of the digestive system: Secondary | ICD-10-CM

## 2020-01-18 DIAGNOSIS — M19071 Primary osteoarthritis, right ankle and foot: Secondary | ICD-10-CM

## 2020-01-18 DIAGNOSIS — M5030 Other cervical disc degeneration, unspecified cervical region: Secondary | ICD-10-CM

## 2020-01-18 DIAGNOSIS — M25531 Pain in right wrist: Secondary | ICD-10-CM

## 2020-01-18 DIAGNOSIS — M0609 Rheumatoid arthritis without rheumatoid factor, multiple sites: Secondary | ICD-10-CM

## 2020-01-18 DIAGNOSIS — M51369 Other intervertebral disc degeneration, lumbar region without mention of lumbar back pain or lower extremity pain: Secondary | ICD-10-CM

## 2020-01-18 DIAGNOSIS — M25462 Effusion, left knee: Secondary | ICD-10-CM

## 2020-01-18 DIAGNOSIS — Z79899 Other long term (current) drug therapy: Secondary | ICD-10-CM

## 2020-01-18 DIAGNOSIS — M19042 Primary osteoarthritis, left hand: Secondary | ICD-10-CM

## 2020-01-18 MED ORDER — PREDNISONE 5 MG PO TABS: ORAL_TABLET | ORAL | 0 refills | Status: DC

## 2020-01-18 NOTE — Patient Instructions (Signed)
Certolizumab pegol injection What is this medicine? CERTOLIZUMAB (SER toe LIZ oo mab) is used to treat rheumatoid arthritis, psoriatic arthritis, ankylosing spondylitis, non-radiographic axial spondyloarthritis, Crohn's disease, and plaque psoriasis. This medicine may be used for other purposes; ask your health care provider or pharmacist if you have questions. COMMON BRAND NAME(S): Cimzia What should I tell my health care provider before I take this medicine? They need to know if you have any of these conditions:  cancer  diabetes  Guillian-Barre syndrome  heart failure  hepatitis B or history of hepatitis B infection  immune system problems  infection or history of infections  low blood counts, like low white cell, platelet, or red cell counts  multiple sclerosis  recently received or scheduled to receive a vaccine  tuberculosis, a positive skin test for tuberculosis or have recently been in close contact with someone who has tuberculosis  an unusual or allergic reaction to certolizumab, other medicines, latex, rubber, foods, dyes, or preservatives  pregnant or trying to get pregnant  breast-feeding How should I use this medicine? This medicine is for injection under the skin. It is usually given by a health care professional in a hospital or clinic setting. If you get this medicine at home, you will be taught how to prepare and give this medicine. Use exactly as directed. Take your medicine at regular intervals. Do not take your medicine more often than directed. It is important that you put your used needles and syringes in a special sharps container. Do not put them in a trash can. If you do not have a sharps container, call your pharmacist or healthcare provider to get one. A special MedGuide will be given to you by the pharmacist with each prescription and refill. Be sure to read this information carefully each time. Talk to your pediatrician regarding the use of this  medicine in children. Special care may be needed. Overdosage: If you think you have taken too much of this medicine contact a poison control center or emergency room at once. NOTE: This medicine is only for you. Do not share this medicine with others. What if I miss a dose? It is important not to miss your dose. Call your doctor or health care professional if you are unable to keep an appointment. If you give your medicine by injection under the skin: If you miss a dose, take it as soon as you can. If it is almost time for your next dose, take only that dose. Do not take double or extra doses. Call your doctor or health care professional if you are not sure how to handle a missed dose. What may interact with this medicine? Do not take this medicine with any of the following medications:  biologic medicines such as abatacept, adalimumab, anakinra, etanercept, golimumab, infliximab, natalizumab, rituximab, secukinumab, tocilizumab, ustekinumab  live vaccines  tofacitinib This list may not describe all possible interactions. Give your health care provider a list of all the medicines, herbs, non-prescription drugs, or dietary supplements you use. Also tell them if you smoke, drink alcohol, or use illegal drugs. Some items may interact with your medicine. What should I watch for while using this medicine? Visit your doctor or health care professional for regular checks on your progress. Tell your doctor or healthcare professional if your symptoms do not start to get better or if they get worse. Your condition will be monitored carefully while you are receiving this medicine. You will be tested for tuberculosis (TB) before you start   this medicine. If your doctor prescribes any medicine for TB, you should start taking the TB medicine before starting this medicine. Make sure to finish the full course of TB medicine. Call your doctor or health care professional for advice if you get a fever, chills, sore  throat, or other symptoms of an infection. Do not treat yourself. This medicine may decrease your body's ability to fight infection. Try to avoid being around people who are sick. Talk to your doctor about your risk of cancer. You may be more at risk for certain types of cancers if you take this medicine. What side effects may I notice from receiving this medicine? Side effects that you should report to your doctor or health care professional as soon as possible:  allergic reactions like skin rash, itching or hives, swelling of the face, lips, or tongue  breathing problems  changes in vision  chest pain  joint or muscle pain  mouth sores  numbness or tingling in any part of your body  signs and symptoms of infection like fever or chills; cough; sore throat; pain or trouble passing urine  signs and symptoms of liver injury like dark yellow or brown urine; general ill feeling or flu-like symptoms; light-colored stools; loss of appetite; nausea; right upper belly pain; unusually weak or tired; yellowing of the eyes or skin  swelling of the legs or ankles  swollen lymph nodes in the neck, underarm, or groin areas  unexplained weight loss  unusual bleeding or bruising  unusually weak or tired Side effects that usually do not require medical attention (report to your doctor or health care professional if they continue or are bothersome):  irritation at site where injected This list may not describe all possible side effects. Call your doctor for medical advice about side effects. You may report side effects to FDA at 1-800-FDA-1088. Where should I keep my medicine? Keep out of the reach of children. If you are using this medicine at home, keep the syringes in the refrigerator between 2 to 8 degrees C (36 to 46 degrees F). Do not freeze. Protect from light. Keep this medicine in the original container. Throw away any unused medicine after the expiration date on the label. NOTE: This  sheet is a summary. It may not cover all possible information. If you have questions about this medicine, talk to your doctor, pharmacist, or health care provider.  2020 Elsevier/Gold Standard (2018-01-04 16:35:32)  

## 2020-01-18 NOTE — Telephone Encounter (Signed)
Submitted a Prior Authorization request to [employer] for [employer] via Cover My Meds. Will update once we receive a response.  (Key: [id number])

## 2020-01-19 ENCOUNTER — Telehealth: Payer: Self-pay | Admitting: Rheumatology

## 2020-01-19 NOTE — Telephone Encounter (Signed)
Received notification from [facility] regarding a prior authorization for [facility]. Authorization has been APPROVED from 01/18/20 to 01/16/21.   Ran test claim, patient's copay is zero.  Patient fills though [facility] and can use a copay card.

## 2020-01-19 NOTE — Telephone Encounter (Signed)
FYI: I spoke with Cala Bradford at [employer]. The case for patient's right wrist MRI is pending until doctor calls to do a peer to peer ,and provide more information. Doctor will need to call [phone], and have patient's name, DOB, and member ID number.

## 2020-01-22 ENCOUNTER — Telehealth: Payer: Self-pay | Admitting: Rheumatology

## 2020-01-22 NOTE — Telephone Encounter (Signed)
Called to notify patient and no answer. Left voicemail notifying of approval.  Informed patient we can not schedule until 4 weeks after last Simponi dose.  Requested return call.   Verlin Fester, PharmD, [place], CPP Clinical Specialty Pharmacist [phone]  01/22/2020 11:12 AM

## 2020-01-22 NOTE — Telephone Encounter (Signed)
I spoke with Dr. Juel Burrow.  The MRI has been approved till July 16, 2020, for [facility] facility.  Authorization #[id number].

## 2020-01-22 NOTE — Telephone Encounter (Signed)
Patient called you back to let you know his last injection was 01/14/2020. If you need anything else, please call.

## 2020-01-25 NOTE — Telephone Encounter (Signed)
Patient will be able to schedule new start Cimzia visit on or after 02/13/20.  Called to discuss with patient but no answer.  Left voicemail instructing patient to schedule new start visit with pharmacy clinic on or after 02/13/20.   Verlin Fester, PharmD, [place], CPP Clinical Specialty Pharmacist [phone]  01/25/2020 9:15 AM

## 2020-01-25 NOTE — Telephone Encounter (Signed)
Patient returned call.  Scheduled for 02/13/20 at 10AM.   Verlin Fester, PharmD, [place], CPP Clinical Specialty Pharmacist [phone]  01/25/2020 9:24 AM

## 2020-02-01 ENCOUNTER — Ambulatory Visit (HOSPITAL_COMMUNITY)
Admission: RE | Admit: 2020-02-01 | Discharge: 2020-02-01 | Disposition: A | Discharge status: Home / Self Care | Payer: BC Managed Care – PPO | Source: Ambulatory Visit | Attending: Rheumatology | Admitting: Rheumatology

## 2020-02-01 ENCOUNTER — Other Ambulatory Visit: Payer: Self-pay

## 2020-02-01 DIAGNOSIS — M25531 Pain in right wrist: Secondary | ICD-10-CM | POA: Insufficient documentation

## 2020-02-01 IMAGING — MR MR WRIST*R* W/O CM
5 series · 40 of 40 positions shown · non-contrast
Comparison: None.

CLINICAL DATA: Injured wrist. Persistent pain. A vitamin-E capsule
was used to mark the area the patient's pain which is along the
radial aspect of the wrist

EXAM:
MR OF THE RIGHT WRIST WITHOUT CONTRAST
TECHNIQUE: Multiplanar, multisequence MR imaging of the right wrist was
performed. No intravenous contrast was administered.

[Series 6: T1 · coronal · right · 3.0mm · 0.47mm/px · 7 of 19 slices shown (1 of 2)]
[im 1/19]
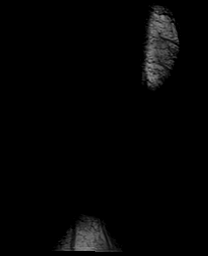
[im 4/19]
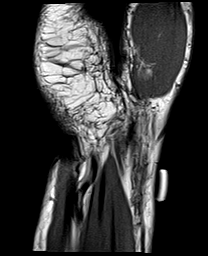
[im 7/19]
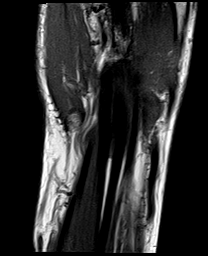
[im 10/19]
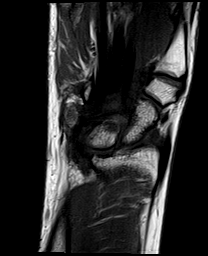
[im 13/19]
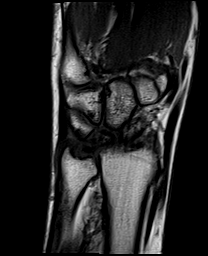
[im 16/19]
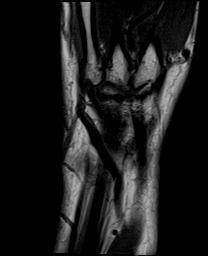
[im 19/19]
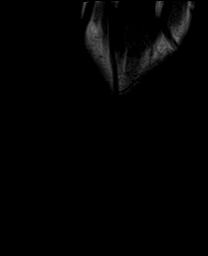

[Series 7: T2 fat-sat · coronal · right · 3.0mm · 0.47mm/px · 8 of 19 slices shown (1 of 2)]
[im 1/19]
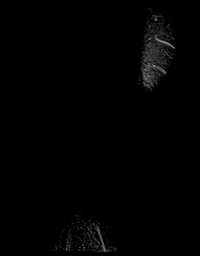
[im 3/19]
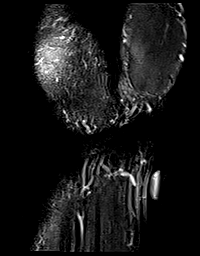
[im 6/19]
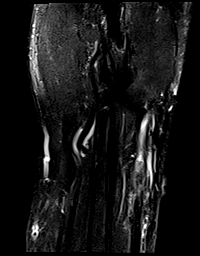
[im 8/19]
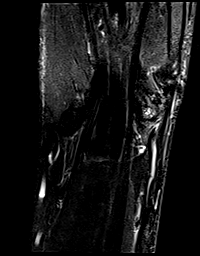
[im 11/19]
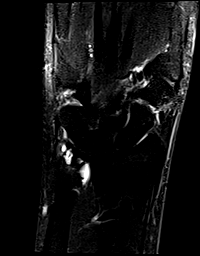
[im 13/19]
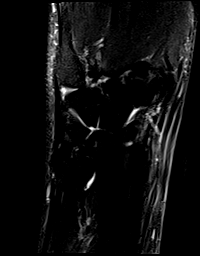
[im 16/19]
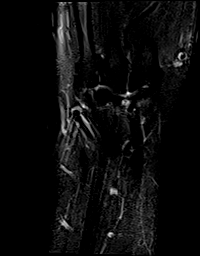
[im 19/19]
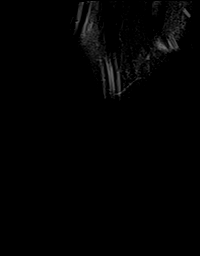

[Series 8: PD fat-sat · sagittal · right · 3.0mm · 0.38mm/px · 9 of 23 slices shown]
[im 1/23]
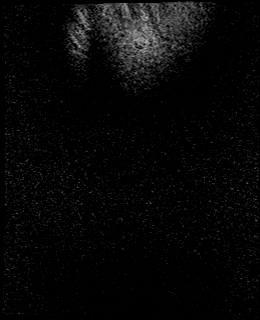
[im 3/23]
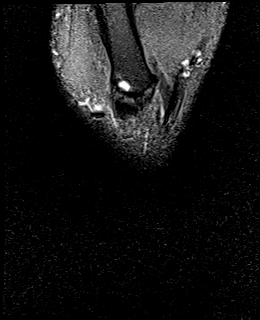
[im 6/23]
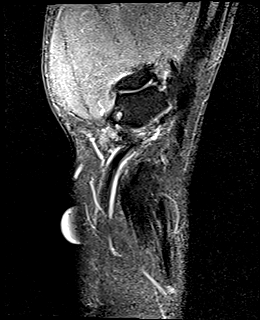
[im 9/23]
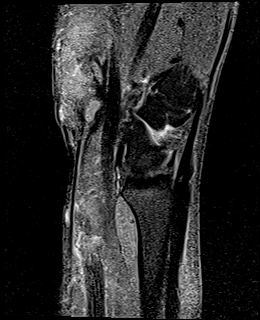
[im 12/23]
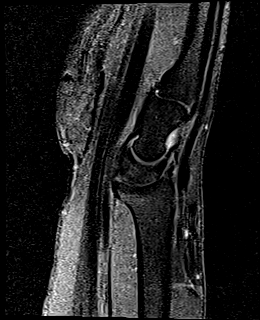
[im 14/23]
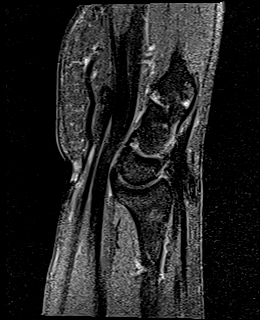
[im 17/23]
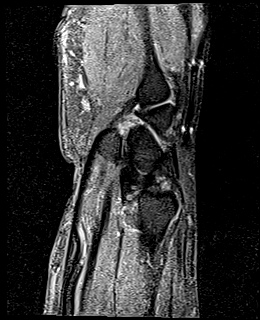
[im 20/23]
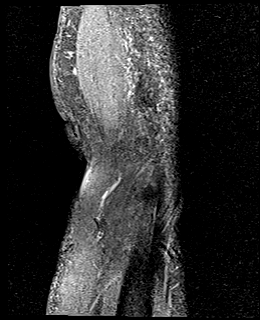
[im 23/23]
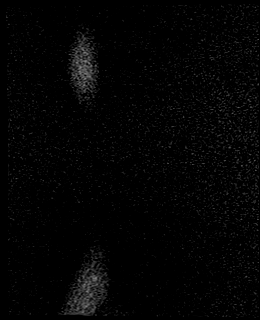

[Series 100: T2 fat-sat · axial · right · 4.0mm · 0.47mm/px · z∈[-31,+56]mm · 8 of 20 slices shown (2 of 2)]
[im 1/20]
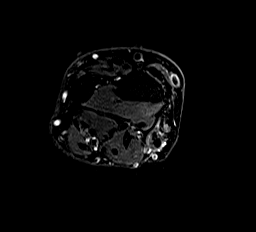
[im 3/20]
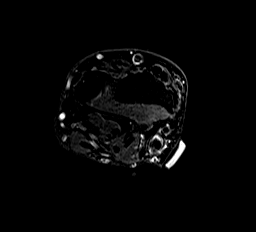
[im 6/20]
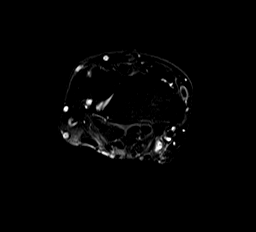
[im 9/20]
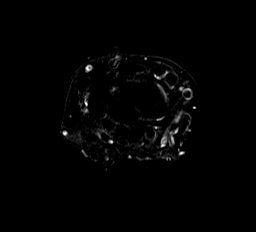
[im 11/20]
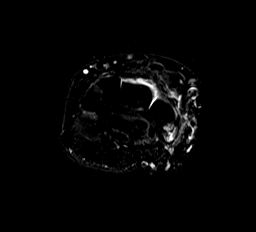
[im 14/20]
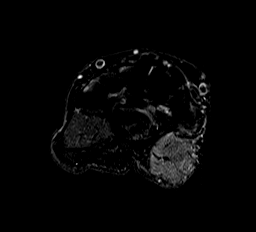
[im 17/20]
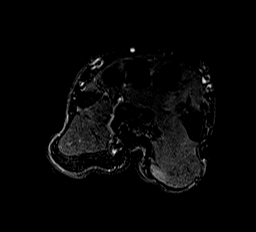
[im 20/20]
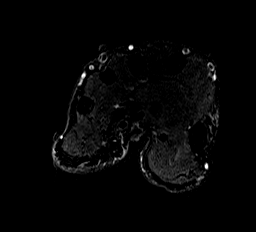

[Series 101: T1 · axial · right · 4.0mm · 0.25mm/px · z∈[-29,+57]mm · 8 of 20 slices shown (2 of 2)]
[im 1/20]
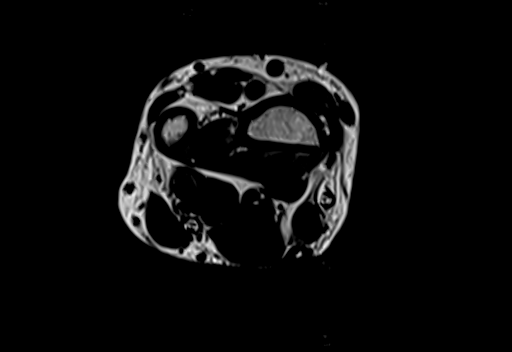
[im 3/20]
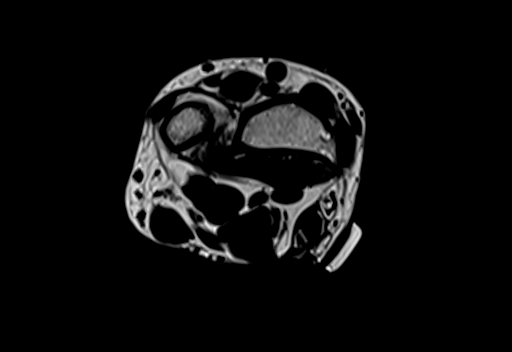
[im 6/20]
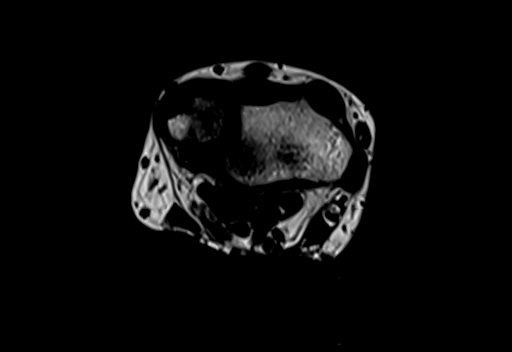
[im 9/20]
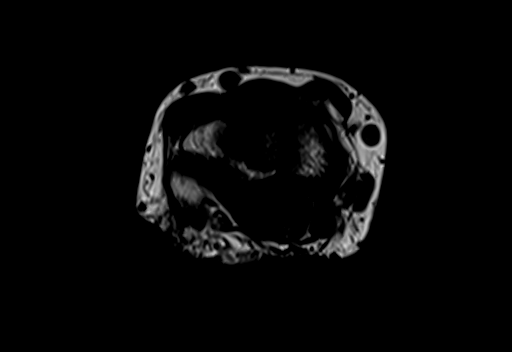
[im 11/20]
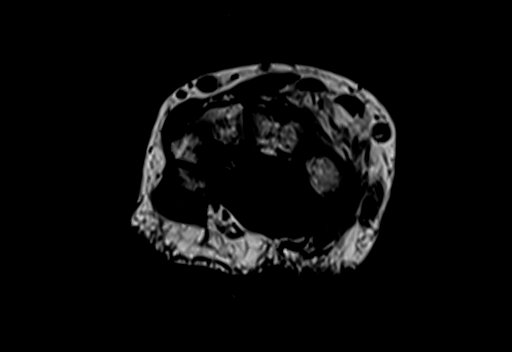
[im 14/20]
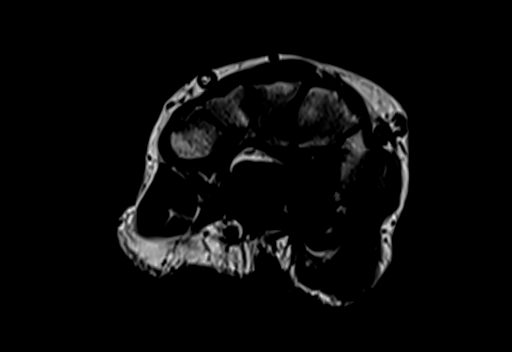
[im 17/20]
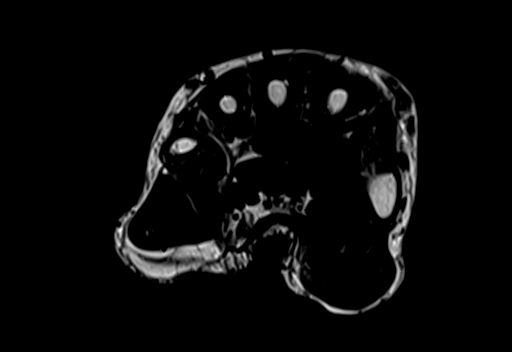
[im 20/20]
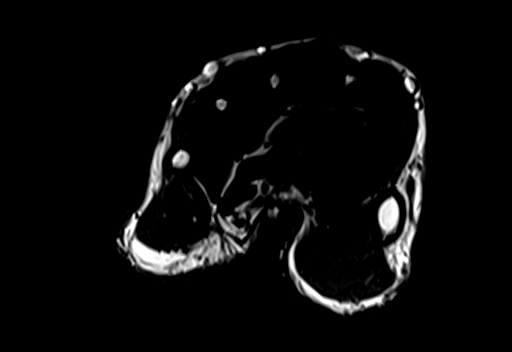

[40 of 40 positions shown; findings below may reference images not displayed]

FINDINGS: Ligaments: The intercarpal ligaments appear intact. The intercarpal
joint spaces are maintained.

Triangular fibrocartilage: The TFCC demonstrates some degenerative
changes. I do not see an obvious tear.

Tendons: The flexor carpi radialis tendon is torn/ruptured and
retracted. Surrounding fluid and inflammation. The other flexor
tendons are intact.

Carpal tunnel/median nerve: The carpal tunnel tendons are intact. No
tendinopathy or tenosynovitis. No mass or mass effect. The median
nerve is grossly normal.

Guyon's canal: Normal appearance of the neurovascular bundle,
surrounded by fat. No mass or mass effect.

Joint/cartilage: Mild degenerative changes and small joint effusion.
Radioulnar joint degenerative changes with moderate-sized joint
effusion.

Degenerative changes at the CMC joint of the thumb with subchondral
cystic change.

Bones/carpal alignment: No acute bony findings.

Other: Unremarkable hand and wrist musculature.
IMPRESSION: 1. Ruptured flexor carpi radialis tendon.
2. Intact intercarpal ligaments and TFCC.
3. Mild wrist joint degenerative changes most notable at the
radioulnar joint with moderate-sized effusion.

## 2020-02-02 ENCOUNTER — Other Ambulatory Visit: Payer: Self-pay | Admitting: Physician Assistant

## 2020-02-02 DIAGNOSIS — M25531 Pain in right wrist: Secondary | ICD-10-CM

## 2020-02-02 DIAGNOSIS — M66331 Spontaneous rupture of flexor tendons, right forearm: Secondary | ICD-10-CM

## 2020-02-06 ENCOUNTER — Ambulatory Visit: Payer: BC Managed Care – PPO | Admitting: Physician Assistant

## 2020-02-06 NOTE — Progress Notes (Deleted)
Pharmacy Note  Subjective:   Patient presents to clinic today to receive initial dose of Cimzia.  Patient running a fever or have signs/symptoms of infection? {yes/no:20286}  Patient currently on antibiotics for the treatment of infection? {yes/no:20286}  Patient have any upcoming invasive procedures/surgeries? {yes/no:20286}  Objective: CMP     Component Value Date/Time   NA 142 11/07/2019 1114   K 3.5 11/07/2019 1114   CL 102 11/07/2019 1114   CO2 28 11/07/2019 1114   GLUCOSE 85 11/07/2019 1114   BUN 24 11/07/2019 1114   CREATININE 1.10 11/07/2019 1114   CALCIUM 9.4 11/07/2019 1114   PROT 6.4 12/07/2019 0952   ALBUMIN 3.0 (L) 11/24/2017 2013   AST 14 11/07/2019 1114   ALT 18 11/07/2019 1114   ALKPHOS 28 (L) 11/24/2017 2013   BILITOT 0.5 11/07/2019 1114   GFRNONAA 76 11/07/2019 1114   GFRAA 88 11/07/2019 1114    CBC    Component Value Date/Time   WBC 13.6 (H) 11/07/2019 1114   RBC 5.08 11/07/2019 1114   HGB 15.4 11/07/2019 1114   HCT 45.4 11/07/2019 1114   PLT 254 11/07/2019 1114   MCV 89.4 11/07/2019 1114   MCH 30.3 11/07/2019 1114   MCHC 33.9 11/07/2019 1114   RDW 13.3 11/07/2019 1114   LYMPHSABS 3,074 11/07/2019 1114   MONOABS 0.9 06/21/2018 2041   EOSABS 54 11/07/2019 1114   BASOSABS 54 11/07/2019 1114    Baseline Immunosuppressant Therapy Labs TB GOLD Quantiferon TB Gold Latest Ref Rng & Units 07/12/2019  Quantiferon TB Gold Plus NEGATIVE NEGATIVE   Hepatitis Panel Hepatitis Latest Ref Rng & Units 12/07/2019  Hep B Surface Ag NON-REACTI NON-REACTIVE  Hep B IgM NON-REACTI NON-REACTIVE  Hep C Ab NON-REACTI NON-REACTIVE  Hep C Ab NON-REACTI NON-REACTIVE  Hep A IgM NON-REACTI NON-REACTIVE   HIV Lab Results  Component Value Date   HIV NON-REACTIVE 12/07/2019   Immunoglobulins Immunoglobulin Electrophoresis Latest Ref Rng & Units 12/07/2019  IgA  47 - 310 mg/dL 419  IgG 379 - 0,240 mg/dL 973(Z)  IgM 50 - 329 mg/dL 53   SPEP Serum Protein  Electrophoresis Latest Ref Rng & Units 12/07/2019  Total Protein 6.1 - 8.1 g/dL 6.4  Albumin 3.8 - 4.8 g/dL 4.1  Alpha-1 0.2 - 0.3 g/dL 0.3  Alpha-2 0.5 - 0.9 g/dL 0.8  Beta Globulin 0.4 - 0.6 g/dL 0.4  Beta 2 0.2 - 0.5 g/dL 0.3  Gamma Globulin 0.8 - 1.7 g/dL 9.2(E)   Q6ST No results found for: G6PDH TPMT No results found for: TPMT   Chest x-ray: ***  Assessment/Plan:  Demonstrated proper injection technique with Cimzia demo device.  Patient able to demonstrate proper injection technique using the teach back method.  Patient self injected in the **** with:  Sample Medication: *** NDC: *** Lot: *** Expiration: **  Patient tolerated well.  Observed for 30 mins in office for adverse reaction and ***.   Patient is to return in 1 month for follow up appointment and labs.  Standing orders placed. Prescription sent to [facility] per patient's request.  All questions encouraged and answered.  Instructed patient to call with any further questions or concerns.

## 2020-02-08 ENCOUNTER — Other Ambulatory Visit: Payer: Self-pay

## 2020-02-08 ENCOUNTER — Ambulatory Visit (INDEPENDENT_AMBULATORY_CARE_PROVIDER_SITE_OTHER): Payer: BC Managed Care – PPO | Admitting: Orthopaedic Surgery

## 2020-02-08 VITALS — Ht 75.0 in | Wt 280.0 lb

## 2020-02-08 DIAGNOSIS — M1712 Unilateral primary osteoarthritis, left knee: Secondary | ICD-10-CM | POA: Diagnosis not present

## 2020-02-08 MED ORDER — TRAMADOL HCL 50 MG PO TABS: 50.0000 mg | ORAL_TABLET | Freq: Two times a day (BID) | ORAL | 2 refills | Status: DC | PRN

## 2020-02-08 NOTE — Progress Notes (Signed)
Office Visit Note   Patient: Gregory Hinton           Date of Birth: [birth year]           MRN: [id number] Visit Date: 02/08/2020              Requested by: [facility], [place],  [place] [address] PCP: [place]: Visit Diagnoses:  1. Primary osteoarthritis of left knee     Plan: Impression is end-stage left knee DJD.  Patient has not received enough pain relief from conservative treatments and at this point he would like to proceed with a left total knee replacement.  Unfortunately he did recently suffer a spontaneous rupture of his FCR tendon for which he is seeing Dr. Amedeo Plenty this coming Monday.  I have asked him to let us know if he will need surgery for this first before undergoing the knee replacement.  We will also have to coordinate timing in terms of his Biologics for RA in terms of the knee replacement.  He will see his PCP later this month for yearly physical.  He is on chronic prednisone for adrenal insufficiency so he may need an increased dose around the time of his knee surgery.  Tramadol was prescribed today.  Risk benefits rehab recovery with the knee replacement reviewed in detail.  The patient will contact me next week through my chart on the treatment plan for his FCR rupture.  Follow-Up Instructions: Return if symptoms worsen or fail to improve.   Orders:  No orders of the defined types were placed in this encounter.  Meds ordered this encounter  Medications  . traMADol (ULTRAM) 50 MG tablet    Sig: Take 1-2 tablets (50-100 mg total) by mouth 2 (two) times daily as needed.    Dispense:  30 tablet    Refill:  2      Procedures: No procedures performed   Clinical Data: No additional findings.   Subjective: Chief Complaint  Patient presents with  . Left Knee - Follow-up    Gregory Hinton is a very pleasant 54 year old gentleman comes in for further discussion of left knee  pain due to his DJD.  He has gone to the point now that he is ready for knee replacement.  He just has not seen enough relief from conservative treatments have included activity modification, cortisone injections, knee brace.  He does have rheumatoid arthritis and is currently on Biologics.  He is also on 5 mg of prednisone for adrenal insufficiency.   Review of Systems  Constitutional: Negative.   All other systems reviewed and are negative.    Objective: Vital Signs: Ht 6\' 3"  (1.905 m)   Wt 280 lb (127 kg)   BMI 35.00 kg/m   Physical Exam Vitals and nursing note reviewed.  Constitutional:      Appearance: He is well-developed.  Pulmonary:     Effort: Pulmonary effort is normal.  Abdominal:     Palpations: Abdomen is soft.  Skin:    General: Skin is warm.  Neurological:     Mental Status: He is alert and oriented to person, place, and time.  Psychiatric:        Behavior: Behavior normal.        Thought Content: Thought content normal.        Judgment: Judgment normal.     Ortho Exam Left knee shows moderate limitation range  of motion secondary to severe pain.  Collaterals and cruciates are stable.  Small joint effusion. Specialty Comments:  No specialty comments available.  Imaging: No results found.   PMFS History: Patient Active Problem List   Diagnosis Date Noted  . High risk medication use 04/19/2018  . History of diverticulitis 05/04/2017  . History of colon polyps 05/04/2017  . Primary osteoarthritis of both hands 05/04/2017  . Primary osteoarthritis of both feet 05/04/2017  . Primary osteoarthritis of both knees 04/02/2017  . Class 1 obesity without serious comorbidity with body mass index (BMI) of 32.0 to 32.9 in adult 10/14/2016  . Spondylosis of lumbar region without myelopathy or radiculopathy 10/14/2016  . DJD (degenerative joint disease), cervical 10/14/2016  . High risk medications (not anticoagulants) long-term use 10/14/2016  . Rheumatoid  arthritis, seronegative, multiple sites (HCC) 10/14/2016  . Lumbar stenosis with neurogenic claudication 12/17/2015   Past Medical History:  Diagnosis Date  . Adrenal insufficiency (HCC)   . Anxiety   . Arthritis   . Back pain    had 3 fusions surgeries  . H/O cardiovascular stress test 2014   admitted at hosp. overnight for chest pain, stress/echo test wnl  . Hypertension   . PONV (postoperative nausea and vomiting)   . Rheumatoid arthritis (HCC)    manages by Dr. Corliss Skains, rec'd Orencia weekly for 2 months, switched from Humira     Family History  Problem Relation Age of Onset  . Aneurysm Mother   . Leukemia Father   . Healthy Daughter   . Healthy Son     Past Surgical History:  Procedure Laterality Date  . ANTERIOR LAT LUMBAR FUSION Left 12/26/2018   Procedure: Left Lumbar Two-Three Anterolateral decompression/interbody fusion/lateral plate;  Surgeon: Barnett Abu, MD;  Location: [facility] OR;  Service: Neurosurgery;  Laterality: Left;  Anterolateral  . BACK SURGERY  [id number]   lumbar fusion 3 times  . CARPAL TUNNEL RELEASE Right 2000   and left side 2 years ago with left ulnar surgery  . COLONOSCOPY    . HERNIA REPAIR Right 2001   inguinal    Social History   Occupational History  . Not on file  Tobacco Use  . Smoking status: Former Smoker    Quit date: 02/18/1998    Years since quitting: 21.9  . Smokeless tobacco: Never Used  Substance and Sexual Activity  . Alcohol use: Yes    Comment: rarely  . Drug use: No  . Sexual activity: Not on file

## 2020-02-13 ENCOUNTER — Telehealth: Payer: Self-pay | Admitting: Pharmacist

## 2020-02-13 ENCOUNTER — Ambulatory Visit: Payer: BC Managed Care – PPO

## 2020-02-13 NOTE — Telephone Encounter (Signed)
Called patient to discuss upcoming surgery and if it would be appropriate to start Cimzia at this time.  Patient states he no longer will have to have surgery on his hand.  He has an appointment to discuss knee replacement surgery on May 26.  Patient states his goal is to have knee replacement surgery the first or second week of June.  Advised it is best to wait to start Cimzia he will not be able to complete the loading dose and will have to hold after surgery.  Patient verbalized understanding.  He is finished his prednisone taper and is currently taking prednisone 5 mg daily.  He states his RA symptoms are starting to return after finishing taper.  He wants to know if another prednisone taper would be appropriate or any other suggestions to help manage his RA symptoms at this time.   Gregory Hinton, PharmD, [place], CPP Clinical Specialty Pharmacist [phone]  02/13/2020 9:28 AM

## 2020-02-13 NOTE — Telephone Encounter (Signed)
Most surgeons do not like to operate on prednisone more than 5 mg p.o. daily.  He may increase the prednisone to 10 mg p.o. daily and decrease prednisone to 5 mg p.o. daily 1 week prior to the surgery.  He may be able to start Cimzia 1 to 2 weeks after knee replacement surgery if cleared by the orthopedic surgeon.

## 2020-02-14 NOTE — Telephone Encounter (Signed)
Attempted to contact the patient and left message for patient to call the office.  

## 2020-02-15 NOTE — Telephone Encounter (Signed)
Patient most surgeons do not like to operate on prednisone more than 5 mg p.o. daily.  He may increase the prednisone to 10 mg p.o. daily and decrease prednisone to 5 mg p.o. daily 1 week prior to the surgery.  He may be able to start Cimzia 1 to 2 weeks after knee replacement surgery if cleared by the orthopedic surgeon. Patient verbalized understanding.

## 2020-02-19 NOTE — Progress Notes (Deleted)
Office Visit Note  Patient: Gregory Hinton             Date of Birth: [birth year]           MRN: [id number]             PCP: [facility] Referring: [facility] Visit Date: 02/29/2020 Occupation: @GUAROCC @  Subjective:  No chief complaint on file.   History of Present Illness: KYMIR COLES is a 54 y.o. male ***   Activities of Daily Living:  Patient reports morning stiffness for *** {minute/hour:19697}.   Patient {ACTIONS;DENIES/REPORTS:21021675::"Denies"} nocturnal pain.  Difficulty dressing/grooming: {ACTIONS;DENIES/REPORTS:21021675::"Denies"} Difficulty climbing stairs: {ACTIONS;DENIES/REPORTS:21021675::"Denies"} Difficulty getting out of chair: {ACTIONS;DENIES/REPORTS:21021675::"Denies"} Difficulty using hands for taps, buttons, cutlery, and/or writing: {ACTIONS;DENIES/REPORTS:21021675::"Denies"}  No Rheumatology ROS completed.   PMFS History:  Patient Active Problem List   Diagnosis Date Noted  . High risk medication use 04/19/2018  . History of diverticulitis 05/04/2017  . History of colon polyps 05/04/2017  . Primary osteoarthritis of both hands 05/04/2017  . Primary osteoarthritis of both feet 05/04/2017  . Primary osteoarthritis of both knees 04/02/2017  . Class 1 obesity without serious comorbidity with body mass index (BMI) of 32.0 to 32.9 in adult 10/14/2016  . Spondylosis of lumbar region without myelopathy or radiculopathy 10/14/2016  . DJD (degenerative joint disease), cervical 10/14/2016  . High risk medications (not anticoagulants) long-term use 10/14/2016  . Rheumatoid arthritis, seronegative, multiple sites (HCC) 10/14/2016  . Lumbar stenosis with neurogenic claudication 12/17/2015    Past Medical History:  Diagnosis Date  . Adrenal insufficiency (HCC)   . Anxiety   . Arthritis   . Back pain    had 3 fusions surgeries  . H/O cardiovascular stress test 2014   admitted at hosp. overnight for chest pain,  stress/echo test wnl  . Hypertension   . PONV (postoperative nausea and vomiting)   . Rheumatoid arthritis (HCC)    manages by Dr. 2015, rec'd Orencia weekly for 2 months, switched from Humira     Family History  Problem Relation Age of Onset  . Aneurysm Mother   . Leukemia Father   . Healthy Daughter   . Healthy Son    Past Surgical History:  Procedure Laterality Date  . ANTERIOR LAT LUMBAR FUSION Left 12/26/2018   Procedure: Left Lumbar Two-Three Anterolateral decompression/interbody fusion/lateral plate;  Surgeon: 12/28/2018, MD;  Location: [facility] OR;  Service: Neurosurgery;  Laterality: Left;  Anterolateral  . BACK SURGERY  Barnett Abu   lumbar fusion 3 times  . CARPAL TUNNEL RELEASE Right 2000   and left side 2 years ago with left ulnar surgery  . COLONOSCOPY    . HERNIA REPAIR Right 2001   inguinal    Social History   Social History Narrative  . Not on file   Immunization History  Administered Date(s) Administered  . Influenza-Unspecified 05/05/2014  . Pneumococcal Polysaccharide-23 04/25/2014  . Zoster 04/25/2014     Objective: Vital Signs: There were no vitals taken for this visit.   Physical Exam   Musculoskeletal Exam: ***  CDAI Exam: CDAI Score: -- Patient Global: --; Provider Global: -- Swollen: --; Tender: -- Joint Exam 02/29/2020   No joint exam has been documented for this visit   There is currently no information documented on the homunculus. Go to the Rheumatology activity and complete the homunculus joint exam.  Investigation: No additional findings.  Imaging: MR WRIST RIGHT WO CONTRAST  Result Date: 02/02/2020 CLINICAL DATA:  Injured wrist. Persistent pain. A vitamin-E capsule was used to mark the area the patient's pain which is along the radial aspect of the wrist EXAM: MR OF THE RIGHT WRIST WITHOUT CONTRAST TECHNIQUE: Multiplanar, multisequence MR imaging of the right wrist was performed. No intravenous contrast was administered.  COMPARISON:  None. FINDINGS: Ligaments: The intercarpal ligaments appear intact. The intercarpal joint spaces are maintained. Triangular fibrocartilage: The TFCC demonstrates some degenerative changes. I do not see an obvious tear. Tendons: The flexor carpi radialis tendon is torn/ruptured and retracted. Surrounding fluid and inflammation. The other flexor tendons are intact. Carpal tunnel/median nerve: The carpal tunnel tendons are intact. No tendinopathy or tenosynovitis. No mass or mass effect. The median nerve is grossly normal. Guyon's canal: Normal appearance of the neurovascular bundle, surrounded by fat. No mass or mass effect. Joint/cartilage: Mild degenerative changes and small joint effusion. Radioulnar joint degenerative changes with moderate-sized joint effusion. Degenerative changes at the [facility] joint of the thumb with subchondral cystic change. Bones/carpal alignment: No acute bony findings. Other: Unremarkable hand and wrist musculature. IMPRESSION: 1. Ruptured flexor carpi radialis tendon. 2. Intact intercarpal ligaments and TFCC. 3. Mild wrist joint degenerative changes most notable at the radioulnar joint with moderate-sized effusion. Electronically Signed   By: Marijo Sanes M.D.   On: 02/02/2020 10:30    Recent Labs: Lab Results  Component Value Date   WBC 13.6 (H) 11/07/2019   HGB 15.4 11/07/2019   PLT 254 11/07/2019   NA 142 11/07/2019   K 3.5 11/07/2019   CL 102 11/07/2019   CO2 28 11/07/2019   GLUCOSE 85 11/07/2019   BUN 24 11/07/2019   CREATININE 1.10 11/07/2019   BILITOT 0.5 11/07/2019   ALKPHOS 28 (L) 11/24/2017   AST 14 11/07/2019   ALT 18 11/07/2019   PROT 6.4 12/07/2019   ALBUMIN 3.0 (L) 11/24/2017   CALCIUM 9.4 11/07/2019   GFRAA 88 11/07/2019   QFTBGOLDPLUS NEGATIVE 07/12/2019    Speciality Comments: Prior therapy: MTX, PLQ, Enbrel, Humira, and Orencia (inadequate response). Due to his history of diverticulitis, Morrie Sheldon is not an option.  Procedures:  No  procedures performed Allergies: Eggs or egg-derived products   Assessment / Plan:     Visit Diagnoses: No diagnosis found.  Orders: No orders of the defined types were placed in this encounter.  No orders of the defined types were placed in this encounter.   Face-to-face time spent with patient was *** minutes. Greater than 50% of time was spent in counseling and coordination of care.  Follow-Up Instructions: No follow-ups on file.   Earnestine Mealing, CMA  Note - This record has been created using Editor, commissioning.  Chart creation errors have been sought, but may not always  have been located. Such creation errors do not reflect on  the standard of medical care.

## 2020-02-29 ENCOUNTER — Ambulatory Visit: Payer: BC Managed Care – PPO | Admitting: Rheumatology

## 2020-05-05 HISTORY — PX: REPLACEMENT TOTAL KNEE: SUR1224

## 2020-07-11 NOTE — Progress Notes (Signed)
Office Visit Note  Patient: Gregory Hinton             Date of Birth: 07-15-1966           MRN: [id number]             PCP: [facility] Referring: [facility] Visit Date: 07/16/2020 Occupation: @GUAROCC @  Subjective:  Increased pain in joints.   History of Present Illness: Gregory Hinton is a 54 y.o. male with history of seronegative rheumatoid arthritis.  He underwent left total knee replacement in August 2021 by Dr.Olin.  He states he had very good recovery from the surgery and is continuing to do well.  He states his been having increased pain and discomfort in his right fifth toe and the lateral proximal aspect of his right foot.  He continues to have discomfort in his shoulders.  He has been off Simponi since June.  He states Simponi caused extreme fatigue and rash and he discontinued the medication.  He states all the medications he tried so far Orencia helped the most.  He would like to go back on Orencia.  He has not had an episode of diverticulitis in the last 15 years.  He has been on leflunomide for a month now.  He is also taking prednisone for adrenal insufficiency for which she has been followed by endocrinologist.  Activities of Daily Living:  Patient reports morning stiffness for 1 hour.   Patient Reports nocturnal pain.  Difficulty dressing/grooming: Denies Difficulty climbing stairs: Denies Difficulty getting out of chair: Denies Difficulty using hands for taps, buttons, cutlery, and/or writing: Reports  Review of Systems  Constitutional: Positive for fatigue.  HENT: Negative for mouth sores, mouth dryness and nose dryness.   Eyes: Negative for pain, itching and dryness.  Respiratory: Negative for shortness of breath and difficulty breathing.   Cardiovascular: Negative for chest pain and palpitations.  Gastrointestinal: Positive for constipation and diarrhea. Negative for blood in stool.  Endocrine: Negative for increased  urination.  Genitourinary: Negative for difficulty urinating.  Musculoskeletal: Positive for arthralgias, joint pain, joint swelling, myalgias, morning stiffness, muscle tenderness and myalgias.  Skin: Negative for color change, rash and redness.  Allergic/Immunologic: Negative for susceptible to infections.  Neurological: Positive for numbness and weakness. Negative for dizziness, headaches and memory loss.  Hematological: Positive for bruising/bleeding tendency.  Psychiatric/Behavioral: Negative for confusion.    PMFS History:  Patient Active Problem List   Diagnosis Date Noted  . High risk medication use 04/19/2018  . History of diverticulitis 05/04/2017  . History of colon polyps 05/04/2017  . Primary osteoarthritis of both hands 05/04/2017  . Primary osteoarthritis of both feet 05/04/2017  . Primary osteoarthritis of both knees 04/02/2017  . Class 1 obesity without serious comorbidity with body mass index (BMI) of 32.0 to 32.9 in adult 10/14/2016  . Spondylosis of lumbar region without myelopathy or radiculopathy 10/14/2016  . DJD (degenerative joint disease), cervical 10/14/2016  . High risk medications (not anticoagulants) long-term use 10/14/2016  . Rheumatoid arthritis, seronegative, multiple sites (HCC) 10/14/2016  . Lumbar stenosis with neurogenic claudication 12/17/2015    Past Medical History:  Diagnosis Date  . Adrenal insufficiency (HCC)   . Anxiety   . Arthritis   . Back pain    had 3 fusions surgeries  . H/O cardiovascular stress test 2014   admitted at hosp. overnight for chest pain, stress/echo test wnl  . Hypertension   . PONV (postoperative nausea  and vomiting)   . Rheumatoid arthritis (HCC)    manages by Dr. Corliss Skains, rec'd Orencia weekly for 2 months, switched from Humira     Family History  Problem Relation Age of Onset  . Aneurysm Mother   . Leukemia Father   . Healthy Daughter   . Healthy Son    Past Surgical History:  Procedure Laterality  Date  . ANTERIOR LAT LUMBAR FUSION Left 12/26/2018   Procedure: Left Lumbar Two-Three Anterolateral decompression/interbody fusion/lateral plate;  Surgeon: Barnett Abu, MD;  Location: [facility] OR;  Service: Neurosurgery;  Laterality: Left;  Anterolateral  . BACK SURGERY  [id number]   lumbar fusion 3 times  . CARPAL TUNNEL RELEASE Right 2000   and left side 2 years ago with left ulnar surgery  . COLONOSCOPY    . HERNIA REPAIR Right 2001   inguinal   . REPLACEMENT TOTAL KNEE Left 05/2020   Social History   Social History Narrative  . Not on file   Immunization History  Administered Date(s) Administered  . Influenza-Unspecified 05/05/2014  . Pneumococcal Polysaccharide-23 04/25/2014  . Zoster 04/25/2014     Objective: Vital Signs: BP (!) 144/95 (BP Location: Right Arm, Patient Position: Sitting, Cuff Size: Normal)   Pulse 94   Resp 16   Ht 6\' 4"  (1.93 m)   Wt 288 lb (130.6 kg)   BMI 35.06 kg/m    Physical Exam Vitals and nursing note reviewed.  Constitutional:      Appearance: He is well-developed.  HENT:     Head: Normocephalic and atraumatic.  Eyes:     Conjunctiva/sclera: Conjunctivae normal.     Pupils: Pupils are equal, round, and reactive to light.  Cardiovascular:     Rate and Rhythm: Normal rate and regular rhythm.     Heart sounds: Normal heart sounds.  Pulmonary:     Effort: Pulmonary effort is normal.     Breath sounds: Normal breath sounds.  Abdominal:     General: Bowel sounds are normal.     Palpations: Abdomen is soft.  Musculoskeletal:     Cervical back: Normal range of motion and neck supple.  Skin:    General: Skin is warm and dry.     Capillary Refill: Capillary refill takes less than 2 seconds.  Neurological:     Mental Status: He is alert and oriented to person, place, and time.  Psychiatric:        Behavior: Behavior normal.      Musculoskeletal Exam: He had painful range of motion of bilateral shoulders.  Elbow joints, wrist joints, MCPs,  PIPs and DIPs with good range of motion.  He has some PIP and DIP thickening but no synovitis.  Hip joints were in good range of motion.  His left knee joint is replaced and had some warmth there.  He had tenderness over right ankle and right foot MTPs but no synovitis was noted.  CDAI Exam: CDAI Score: 3.2  Patient Global: 8 mm; Provider Global: 4 mm Swollen: 0 ; Tender: 8  Joint Exam 07/16/2020      Right  Left  Glenohumeral   Tender   Tender  Subtalar   Tender     MTP 1   Tender     MTP 2   Tender     MTP 3   Tender     MTP 4   Tender     MTP 5   Tender        Investigation: No additional findings.  Imaging: No results found.  Recent Labs: Lab Results  Component Value Date   WBC 13.6 (H) 11/07/2019   HGB 15.4 11/07/2019   PLT 254 11/07/2019   NA 142 11/07/2019   K 3.5 11/07/2019   CL 102 11/07/2019   CO2 28 11/07/2019   GLUCOSE 85 11/07/2019   BUN 24 11/07/2019   CREATININE 1.10 11/07/2019   BILITOT 0.5 11/07/2019   ALKPHOS 28 (L) 11/24/2017   AST 14 11/07/2019   ALT 18 11/07/2019   PROT 6.4 12/07/2019   ALBUMIN 3.0 (L) 11/24/2017   CALCIUM 9.4 11/07/2019   GFRAA 88 11/07/2019   QFTBGOLDPLUS NEGATIVE 07/12/2019   Left stent at [facility] June 05, 2020 CBC WBC 13.6, hemoglobin 15.0, platelets 249, CMP normal, calcium 10.6 mildly elevated.   Speciality Comments: Prior therapy: MTX, PLQ, Enbrel, Humira, and Orencia (inadequate response). Due to his history of diverticulitis, Harriette Ohara is not an option. Simponi-rash and fatigue  Procedures:  No procedures performed Allergies: Eggs or egg-derived products   Assessment / Plan:     Visit Diagnoses: Rheumatoid arthritis, seronegative, multiple sites (HCC)-he continues to have a lot of pain and discomfort in his joints.  No synovitis was noted.  He states his swelling has gone down since he has been taking prednisone for adrenal insufficiency.  He has tried several medications in the past including  methotrexate, Enbrel, Humira, Orencia and Simponi.  The Simponi was the last medication he was on which caused rash and fatigue.  He believes that Dub Amis was the most effective medication and he would like to go back on Orencia.  Indications side effects contraindications were discussed and reviewed.  Handout was given and consent was taken.  We will apply for Orencia.  Medication counseling:  TB Gold: July 12, 2019, repeat pending Hepatitis panel: December 07, 2019 HIV: December 07, 2019 SPEP: December 07, 2019 Immunoglobulins: December 07, 2019  Does patient have a diagnosis of COPD? No  Counseled patient that Dub Amis is a selective T-cell costimulation blocker indicated for rheumatoid arthritis.  Counseled patient on purpose, proper use, and adverse effects of Orencia. The most common adverse effects are increased risk of infections, headache, and injection site reactions.  There is the possibility of an increased risk of malignancy but it is not well understood if this increased risk is due to the medication or the disease state.  Reviewed the importance of regular labs while on Orencia therapy.  Counseled patient that Dub Amis should be held prior to scheduled surgery.  Counseled patient to avoid live vaccines while on Orencia.  Advised patient to get annual influenza vaccine and the pneumococcal vaccine as indicated.  Provided patient with medication education material and answered all questions.  Patient consented to [facility].  Will upload consent into patient's chart.  Will apply for Orencia through patient's insurance.  Reviewed storage information for Orencia.  Advised initial injection must be administered in office.    High risk medication use-he is currently on Arava 20 mg p.o. daily.  He is also taking prednisone 10 mg p.o. daily for adrenal insufficiency.  He has been advised not to take ibuprofen with prednisone.  Spontaneous rupture of flexor tendon of right forearm  Primary osteoarthritis of both  hands-joint protection muscle strengthening was discussed.  Primary osteoarthritis of both knees-he continues to have some discomfort in his knees.  Status post total left knee replacement - August 2021 by Dr. Charlann Boxer.  He had some warmth on palpation but has been recovering well.  Primary osteoarthritis of both feet-he complains of pain and discomfort in his feet.  He has difficulty walking on his right foot.  He states he notices intermittent swelling.  Which has been better since he is taking prednisone.  DDD (degenerative disc disease), cervical-he has chronic pain and discomfort.  DDD (degenerative disc disease), lumbar-chronic pain.  Spondylosis of lumbar region without myelopathy or radiculopathy  History of colon polyps  History of diverticulitis-he has not had an episode in 15 years.  Adrenal insufficiency (HCC)-he is followed by endocrinologist.  Currently he is on prednisone 10 mg p.o. daily.  Educated about COVID-19 virus infection.  Patient has not been immunized and does not want to take the vaccine.  Risk of not getting the vaccination was discussed.  Use of mask, social distancing and hand hygiene was discussed.  Use of monoclonal antibody was also discussed in case he develops COVID-19 infection.  Orders: No orders of the defined types were placed in this encounter.  No orders of the defined types were placed in this encounter.   .  Follow-Up Instructions: Return in about 2 months (around 09/15/2020) for Rheumatoid arthritis.   Pollyann Savoy, MD  Note - This record has been created using Animal nutritionist.  Chart creation errors have been sought, but may not always  have been located. Such creation errors do not reflect on  the standard of medical care.

## 2020-07-16 ENCOUNTER — Telehealth: Payer: Self-pay

## 2020-07-16 ENCOUNTER — Other Ambulatory Visit: Payer: Self-pay

## 2020-07-16 ENCOUNTER — Ambulatory Visit (INDEPENDENT_AMBULATORY_CARE_PROVIDER_SITE_OTHER): Payer: BC Managed Care – PPO | Admitting: Rheumatology

## 2020-07-16 ENCOUNTER — Encounter: Payer: Self-pay | Admitting: Rheumatology

## 2020-07-16 VITALS — BP 144/95 | HR 94 | Resp 16 | Ht 76.0 in | Wt 288.0 lb

## 2020-07-16 DIAGNOSIS — E2740 Unspecified adrenocortical insufficiency: Secondary | ICD-10-CM

## 2020-07-16 DIAGNOSIS — M170 Bilateral primary osteoarthritis of knee: Secondary | ICD-10-CM

## 2020-07-16 DIAGNOSIS — Z9225 Personal history of immunosupression therapy: Secondary | ICD-10-CM

## 2020-07-16 DIAGNOSIS — Z86010 Personal history of colonic polyps: Secondary | ICD-10-CM

## 2020-07-16 DIAGNOSIS — M19042 Primary osteoarthritis, left hand: Secondary | ICD-10-CM

## 2020-07-16 DIAGNOSIS — Z79899 Other long term (current) drug therapy: Secondary | ICD-10-CM | POA: Diagnosis not present

## 2020-07-16 DIAGNOSIS — Z96652 Presence of left artificial knee joint: Secondary | ICD-10-CM

## 2020-07-16 DIAGNOSIS — M66331 Spontaneous rupture of flexor tendons, right forearm: Secondary | ICD-10-CM | POA: Diagnosis not present

## 2020-07-16 DIAGNOSIS — M5030 Other cervical disc degeneration, unspecified cervical region: Secondary | ICD-10-CM

## 2020-07-16 DIAGNOSIS — M19072 Primary osteoarthritis, left ankle and foot: Secondary | ICD-10-CM

## 2020-07-16 DIAGNOSIS — Z111 Encounter for screening for respiratory tuberculosis: Secondary | ICD-10-CM

## 2020-07-16 DIAGNOSIS — M19041 Primary osteoarthritis, right hand: Secondary | ICD-10-CM

## 2020-07-16 DIAGNOSIS — Z8719 Personal history of other diseases of the digestive system: Secondary | ICD-10-CM

## 2020-07-16 DIAGNOSIS — M0609 Rheumatoid arthritis without rheumatoid factor, multiple sites: Secondary | ICD-10-CM | POA: Diagnosis not present

## 2020-07-16 DIAGNOSIS — M19071 Primary osteoarthritis, right ankle and foot: Secondary | ICD-10-CM

## 2020-07-16 DIAGNOSIS — M47816 Spondylosis without myelopathy or radiculopathy, lumbar region: Secondary | ICD-10-CM

## 2020-07-16 DIAGNOSIS — M5136 Other intervertebral disc degeneration, lumbar region: Secondary | ICD-10-CM

## 2020-07-16 NOTE — Patient Instructions (Signed)
Abatacept solution for injection (subcutaneous or intravenous use) What is this medicine? ABATACEPT (a ba TA sept) is used to treat moderate to severe active rheumatoid arthritis or psoriatic arthritis in adults. This medicine is also used to treat juvenile idiopathic arthritis. This medicine may be used for other purposes; ask your health care provider or pharmacist if you have questions. COMMON BRAND NAME(S): Orencia What should I tell my health care provider before I take this medicine? They need to know if you have any of these conditions:  cancer  diabetes  hepatitis B or history of hepatitis B infection  immune system problems  infection or history of infection (especially a virus infection such as chickenpox, cold sores, or herpes)  lung or breathing problems, like chronic obstructive pulmonary disease (COPD)  recently received or scheduled to receive a vaccination  scheduled to have surgery  tuberculosis, a positive skin test for tuberculosis, or have recently been in close contact with someone who has tuberculosis  an unusual or allergic reaction to abatacept, other medicines, foods, dyes, or preservatives  pregnant or trying to get pregnant  breast-feeding How should I use this medicine? This medicine is for infusion into a vein or for injection under the skin. Infusions are given by a health care professional in a hospital or clinic setting. If you are to give your own medicine at home, you will be taught how to prepare and give this medicine under the skin. Use exactly as directed. Take your medicine at regular intervals. Do not take your medicine more often than directed. It is important that you put your used needles and syringes in a special sharps container. Do not put them in a trash can. If you do not have a sharps container, call your pharmacist or health care provider to get one. Talk to your pediatrician regarding the use of this medicine in children. While  infusions in a clinic may be prescribed for children as young as 2 years for selected conditions, precautions do apply. Overdosage: If you think you have taken too much of this medicine contact a poison control center or emergency room at once. NOTE: This medicine is only for you. Do not share this medicine with others. What if I miss a dose? This medicine is used once a week if given by injection under the skin. If you miss a dose, take it as soon as you can. If it is almost time for your next dose, take only that dose. Do not take double or extra doses. If you are to be given an infusion of this medicine, it is important not to miss your dose. Doses are usually every 4 weeks. Call your doctor or health care professional if you are unable to keep an appointment. What may interact with this medicine? Do not take this medicine with any of the following medications:  live vaccines This medicine may also interact with the following medications:  anakinra  baricitinib  canakinumab  medicines that lower your chance of fighting an infection  rituximab  TNF blockers such as adalimumab, certolizumab, etanercept, golimumab, infliximab  tocilizumab  tofacitinib  upadacitinib  ustekinumab This list may not describe all possible interactions. Give your health care provider a list of all the medicines, herbs, non-prescription drugs, or dietary supplements you use. Also tell them if you smoke, drink alcohol, or use illegal drugs. Some items may interact with your medicine. What should I watch for while using this medicine? Visit your doctor for regular checks on your   progress. Tell your doctor or health care professional if your symptoms do not start to get better or if they get worse. You will be tested for tuberculosis (TB) before you start this medicine. If your doctor prescribed any medicine for TB, you should start taking the TB medicine before starting this medicine. Make sure to finish the  full course of TB medicine. This medicine may increase your risk of getting an infection. Call your doctor or health care professional if you get fever, chills, or sore throat, or other symptoms of a cold or flu. Do not treat yourself. Try to avoid being around people who are sick. If you have diabetes and are getting this medicine in a vein, the infusion can give false high blood sugar readings on the day of your dose. This may happen if you use certain types of blood glucose tests. Your health care provider may tell you to use a different way to monitor your blood sugar levels. What side effects may I notice from receiving this medicine? Side effects that you should report to your doctor or health care professional as soon as possible:  allergic reactions like skin rash, itching or hives, swelling of the face, lips, or tongue  breathing problems  chest pain  dizziness  signs and symptoms of infection like fever; chills; cough; sore throat; pain or trouble passing urine  unusually weak or tired Side effects that usually do not require medical attention (report to your doctor or health care professional if they continue or are bothersome):  diarrhea  headache  nausea  pain, redness, or irritation at site where injected  stomach pain or upset This list may not describe all possible side effects. Call your doctor for medical advice about side effects. You may report side effects to FDA at 1-800-FDA-1088. Where should I keep my medicine? Infusions will be given in a hospital or clinic and will not be stored at home. Storage for syringes and autoinjectors stored at home: Keep out of the reach of children. Store in a refrigerator between 2 and 8 degrees C (36 and 46 degrees F). Keep this medicine in the original container. Protect from light. Do not freeze. Do not shake. Throw away any unused medicine after the expiration date. NOTE: This sheet is a summary. It may not cover all possible  information. If you have questions about this medicine, talk to your doctor, pharmacist, or health care provider.  2020 Elsevier/Gold Standard (2019-03-28 14:01:21)  Standing Labs We placed an order today for your standing lab work.   Please have your standing labs drawn in 1 month after starting Orencia and then every 3 months  If possible, please have your labs drawn 2 weeks prior to your appointment so that the provider can discuss your results at your appointment.  We have open lab daily Monday through Thursday from 8:30-12:30 PM and 1:30-4:30 PM and Friday from 8:30-12:30 PM and 1:30-4:00 PM at the office of Dr. Pollyann Savoy, [facility].   Please be advised, patients with office appointments requiring lab work will take precedents over walk-in lab work.  If possible, please come for your lab work on Monday and Friday afternoons, as you may experience shorter wait times. The office is located at [address], [place], [place] [address] No appointment is necessary.   Labs are drawn by [facility]. Please bring your co-pay at the time of your lab draw.  You may receive a bill from [facility] for your lab work.  If you wish to have your labs drawn at another location, please call the office 24 hours in advance to send orders.  If you have any questions regarding directions or hours of operation,  please call [phone].   As a reminder, please drink plenty of water prior to coming for your lab work. Thanks!

## 2020-07-16 NOTE — Telephone Encounter (Signed)
Please apply for subcutaneous orencia per Dr. Corliss Skains. Thanks!   Consent obtained and sent to the scan center.

## 2020-07-16 NOTE — Telephone Encounter (Signed)
Submitted a Prior Authorization request to [employer] for [employer] via Cover My Meds. Will update once we receive a response.   (Key: [id number])

## 2020-07-18 NOTE — Telephone Encounter (Signed)
Received fax for additional clinical information. Faxed to [employer]. Awaiting determination.  Fax# [phone]

## 2020-07-22 NOTE — Telephone Encounter (Signed)
Yes, he should get repeat CBC CMP and TB Gold.

## 2020-07-22 NOTE — Telephone Encounter (Signed)
Received notification from [facility] regarding a prior authorization for [facility]. Authorization has been APPROVED from 07/16/20 to 07/16/21.   Authorization # [id number]  Patient can still fill through [facility], current copay for 1 month is zero, patient may need to reactivate his previous Orencia copay card.

## 2020-07-22 NOTE — Telephone Encounter (Signed)
Attempted to contact the patient and left message for patient to call the office.  

## 2020-07-22 NOTE — Telephone Encounter (Signed)
Patient advised that the [place] has been approved and he may continue to fill with [facility]. Patient advised he will need updated labs prior to scheduling his new start.

## 2020-07-22 NOTE — Addendum Note (Signed)
Addended by: Henriette Combs on: 07/22/2020 02:07 PM   Modules accepted: Orders

## 2020-07-23 ENCOUNTER — Other Ambulatory Visit: Payer: Self-pay | Admitting: *Deleted

## 2020-07-23 DIAGNOSIS — Z79899 Other long term (current) drug therapy: Secondary | ICD-10-CM

## 2020-07-23 DIAGNOSIS — Z111 Encounter for screening for respiratory tuberculosis: Secondary | ICD-10-CM

## 2020-07-23 DIAGNOSIS — Z9225 Personal history of immunosupression therapy: Secondary | ICD-10-CM

## 2020-07-24 NOTE — Progress Notes (Signed)
CBC and CMP is stable. Anemia noted. Please advise patient to take multivitamin with iron and follow-up with his PCP.

## 2020-07-25 LAB — CBC WITH DIFFERENTIAL/PLATELET
Absolute Monocytes: 946 cells/uL (ref 200–950)
Basophils Absolute: 80 cells/uL (ref 0–200)
Basophils Relative: 0.7 %
Eosinophils Absolute: 285 cells/uL (ref 15–500)
Eosinophils Relative: 2.5 %
HCT: 45.2 % (ref 38.5–50.0)
Hemoglobin: 15.1 g/dL (ref 13.2–17.1)
Lymphs Abs: 3010 cells/uL (ref 850–3900)
MCH: 30.8 pg (ref 27.0–33.0)
MCHC: 33.4 g/dL (ref 32.0–36.0)
MCV: 92.2 fL (ref 80.0–100.0)
MPV: 11 fL (ref 7.5–12.5)
Monocytes Relative: 8.3 %
Neutro Abs: 7079 cells/uL (ref 1500–7800)
Neutrophils Relative %: 62.1 %
Platelets: 248 10*3/uL (ref 140–400)
RBC: 4.9 10*6/uL (ref 4.20–5.80)
RDW: 13.1 % (ref 11.0–15.0)
Total Lymphocyte: 26.4 %
WBC: 11.4 10*3/uL — ABNORMAL HIGH (ref 3.8–10.8)

## 2020-07-25 LAB — QUANTIFERON-TB GOLD PLUS
Mitogen-NIL: >10 IU/mL
NIL: 0.03 IU/mL
QuantiFERON-TB Gold Plus: NEGATIVE
TB1-NIL: 0 IU/mL
TB2-NIL: 0.01 IU/mL

## 2020-07-25 LAB — COMPLETE METABOLIC PANEL WITH GFR
AG Ratio: 2 (calc) (ref 1.0–2.5)
ALT: 19 U/L (ref 9–46)
AST: 11 U/L (ref 10–35)
Albumin: 3.9 g/dL (ref 3.6–5.1)
Alkaline phosphatase (APISO): 49 U/L (ref 35–144)
BUN: 23 mg/dL (ref 7–25)
CO2: 27 mmol/L (ref 20–32)
Calcium: 9.4 mg/dL (ref 8.6–10.3)
Chloride: 104 mmol/L (ref 98–110)
Creat: 0.87 mg/dL (ref 0.70–1.33)
GFR, Est African American: 113 mL/min/{1.73_m2} (ref >=60)
GFR, Est Non African American: 97 mL/min/{1.73_m2} (ref >=60)
Globulin: 2 g/dL (calc) (ref 1.9–3.7)
Glucose, Bld: 93 mg/dL (ref 65–99)
Potassium: 3.8 mmol/L (ref 3.5–5.3)
Sodium: 139 mmol/L (ref 135–146)
Total Bilirubin: 0.3 mg/dL (ref 0.2–1.2)
Total Protein: 5.9 g/dL — ABNORMAL LOW (ref 6.1–8.1)

## 2020-07-25 NOTE — Telephone Encounter (Signed)
Patient scheduled for an Orencia new start on 08/02/2020 at 10:40 am.

## 2020-07-25 NOTE — Progress Notes (Signed)
TB Gold is negative.

## 2020-08-02 ENCOUNTER — Other Ambulatory Visit: Payer: Self-pay | Admitting: Physician Assistant

## 2020-08-02 ENCOUNTER — Ambulatory Visit (INDEPENDENT_AMBULATORY_CARE_PROVIDER_SITE_OTHER): Payer: BC Managed Care – PPO | Admitting: Physician Assistant

## 2020-08-02 ENCOUNTER — Other Ambulatory Visit: Payer: Self-pay

## 2020-08-02 ENCOUNTER — Encounter: Payer: Self-pay | Admitting: Physician Assistant

## 2020-08-02 VITALS — BP 144/95 | HR 94

## 2020-08-02 DIAGNOSIS — Z79899 Other long term (current) drug therapy: Secondary | ICD-10-CM | POA: Diagnosis not present

## 2020-08-02 DIAGNOSIS — M0609 Rheumatoid arthritis without rheumatoid factor, multiple sites: Secondary | ICD-10-CM

## 2020-08-02 MED ORDER — ORENCIA CLICKJECT 125 MG/ML ~~LOC~~ SOAJ: 125.0000 mg | SUBCUTANEOUS | 0 refills | Status: DC

## 2020-08-02 NOTE — Progress Notes (Signed)
Pharmacy Note  Subjective:   Patient presents to clinic today to restart on Orencia 125 mg sq injection today in the office. He was previously on orencia for 2 years. Pateint is currently on arava 20mg  by mouth daily and prednisone 10mg  by mouth daily.   Prior therapy: MTX, PLQ, Enbrel, Humira, and Orencia (on for 2 years). Due to his history of diverticulitis, is not an option. Simponi-rash and fatigue  Lab results from 07/23/20 were reviewed with the patient today.   He had his left knee replaced 9 weeks ago (August 2021) by Dr. 07/25/20.  He has not had any complications or signs of infection.  He was cleared by his orthopedic surgeon to restart Orencia.   Covid-19 vaccine status: patient is not vaccinated.   Patient running a fever or have signs/symptoms of infection? No  Patient currently on antibiotics for the treatment of infection? No  Patient have any upcoming invasive procedures/surgeries? No   Objective: CMP     Component Value Date/Time   NA 139 07/23/2020 1039   K 3.8 07/23/2020 1039   CL 104 07/23/2020 1039   CO2 27 07/23/2020 1039   GLUCOSE 93 07/23/2020 1039   BUN 23 07/23/2020 1039   CREATININE 0.87 07/23/2020 1039   CALCIUM 9.4 07/23/2020 1039   PROT 5.9 (L) 07/23/2020 1039   ALBUMIN 3.0 (L) 11/24/2017 2013   AST 11 07/23/2020 1039   ALT 19 07/23/2020 1039   ALKPHOS 28 (L) 11/24/2017 2013   BILITOT 0.3 07/23/2020 1039   GFRNONAA 97 07/23/2020 1039   GFRAA 113 07/23/2020 1039    CBC    Component Value Date/Time   WBC 11.4 (H) 07/23/2020 1039   RBC 4.90 07/23/2020 1039   HGB 15.1 07/23/2020 1039   HCT 45.2 07/23/2020 1039   PLT 248 07/23/2020 1039   MCV 92.2 07/23/2020 1039   MCH 30.8 07/23/2020 1039   MCHC 33.4 07/23/2020 1039   RDW 13.1 07/23/2020 1039   LYMPHSABS 3,010 07/23/2020 1039   MONOABS 0.9 06/21/2018 2041   EOSABS 285 07/23/2020 1039   BASOSABS 80 07/23/2020 1039    Baseline Immunosuppressant Therapy Labs TB GOLD Quantiferon  TB Gold Latest Ref Rng & Units 07/23/2020  Quantiferon TB Gold Plus NEGATIVE NEGATIVE   Hepatitis Panel Hepatitis Latest Ref Rng & Units 12/07/2019  Hep B Surface Ag NON-REACTI NON-REACTIVE  Hep B IgM NON-REACTI NON-REACTIVE  Hep C Ab NON-REACTI NON-REACTIVE  Hep C Ab NON-REACTI NON-REACTIVE  Hep A IgM NON-REACTI NON-REACTIVE   HIV Lab Results  Component Value Date   HIV NON-REACTIVE 12/07/2019   Immunoglobulins Immunoglobulin Electrophoresis Latest Ref Rng & Units 12/07/2019  IgA  47 - 310 mg/dL 02/06/2020  IgG 02/06/2020 - 694 mg/dL 854)  IgM 50 - 6,270 mg/dL 53   SPEP Serum Protein Electrophoresis Latest Ref Rng & Units 07/23/2020  Total Protein 6.1 - 8.1 g/dL 5.9(L)  Albumin 3.8 - 4.8 g/dL -  Alpha-1 0.2 - 0.3 g/dL -  Alpha-2 0.5 - 0.9 g/dL -  Beta Globulin 0.4 - 0.6 g/dL -  Beta 2 0.2 - 0.5 g/dL -  Gamma Globulin 0.8 - 1.7 g/dL -   938 No results found for: G6PDH TPMT No results found for: TPMT   Chest x-ray: Negative CXR 04/11/14  Assessment/Plan:  Demonstrated proper injection technique with Orencia demo device  Patient able to demonstrate proper injection technique using the teach back method.  Patient self injected in the left lower abdomen with:  Sample Medication:  Orencia clickject GTIN: [id number] S/N: [id number] Lot: [id number] Expiration: 11/2021  Patient tolerated well.  Observed for 30 mins in office for adverse reaction and no injection site reaction noted. He tolerated the injection.    Patient is to return in 1 month for labs and 8 weeks for follow-up appointment.  Standing orders for CBC and CMP were placed.   Orencia clickject approved through insurance.  Prescription sent to [facility].  All questions encouraged and answered.  Instructed patient to call with any further questions or concerns.  Sherron Ales, PA-C   08/02/2020 11:49 AM

## 2020-08-02 NOTE — Patient Instructions (Signed)
Standing Labs We placed an order today for your standing lab work.   Please have your standing labs drawn in 1 month then every 3 months   If possible, please have your labs drawn 2 weeks prior to your appointment so that the provider can discuss your results at your appointment.  We have open lab daily Monday through Thursday from 8:30-12:30 PM and 1:30-4:30 PM and Friday from 8:30-12:30 PM and 1:30-4:00 PM at the office of Dr. Shaili Deveshwar, [facility].   Please be advised, patients with office appointments requiring lab work will take precedents over walk-in lab work.  If possible, please come for your lab work on Monday and Friday afternoons, as you may experience shorter wait times. The office is located at [address], [address], [place], [place] [address] No appointment is necessary.   Labs are drawn by [facility]. Please bring your co-pay at the time of your lab draw.  You may receive a bill from [facility] for your lab work.  If you wish to have your labs drawn at another location, please call the office 24 hours in advance to send orders.  If you have any questions regarding directions or hours of operation,  please call [phone].   As a reminder, please drink plenty of water prior to coming for your lab work. Thanks!   

## 2020-09-05 NOTE — Progress Notes (Deleted)
Office Visit Note  Patient: Gregory Hinton             Date of Birth: Dec 25, 1965           MRN: [id number]             PCP: [facility] Referring: [facility] Visit Date: 09/19/2020 Occupation: @GUAROCC @  Subjective:  No chief complaint on file.   History of Present Illness: Gregory Hinton is a 54 y.o. male ***   Activities of Daily Living:  Patient reports morning stiffness for *** {minute/hour:19697}.   Patient {ACTIONS;DENIES/REPORTS:21021675::"Denies"} nocturnal pain.  Difficulty dressing/grooming: {ACTIONS;DENIES/REPORTS:21021675::"Denies"} Difficulty climbing stairs: {ACTIONS;DENIES/REPORTS:21021675::"Denies"} Difficulty getting out of chair: {ACTIONS;DENIES/REPORTS:21021675::"Denies"} Difficulty using hands for taps, buttons, cutlery, and/or writing: {ACTIONS;DENIES/REPORTS:21021675::"Denies"}  No Rheumatology ROS completed.   PMFS History:  Patient Active Problem List   Diagnosis Date Noted  . High risk medication use 04/19/2018  . History of diverticulitis 05/04/2017  . History of colon polyps 05/04/2017  . Primary osteoarthritis of both hands 05/04/2017  . Primary osteoarthritis of both feet 05/04/2017  . Primary osteoarthritis of both knees 04/02/2017  . Class 1 obesity without serious comorbidity with body mass index (BMI) of 32.0 to 32.9 in adult 10/14/2016  . Spondylosis of lumbar region without myelopathy or radiculopathy 10/14/2016  . DJD (degenerative joint disease), cervical 10/14/2016  . High risk medications (not anticoagulants) long-term use 10/14/2016  . Rheumatoid arthritis, seronegative, multiple sites (HCC) 10/14/2016  . Lumbar stenosis with neurogenic claudication 12/17/2015    Past Medical History:  Diagnosis Date  . Adrenal insufficiency (HCC)   . Anxiety   . Arthritis   . Back pain    had 3 fusions surgeries  . H/O cardiovascular stress test 2014   admitted at hosp. overnight for chest pain,  stress/echo test wnl  . Hypertension   . PONV (postoperative nausea and vomiting)   . Rheumatoid arthritis (HCC)    manages by Dr. 2015, rec'd Orencia weekly for 2 months, switched from Humira     Family History  Problem Relation Age of Onset  . Aneurysm Mother   . Leukemia Father   . Healthy Daughter   . Healthy Son    Past Surgical History:  Procedure Laterality Date  . ANTERIOR LAT LUMBAR FUSION Left 12/26/2018   Procedure: Left Lumbar Two-Three Anterolateral decompression/interbody fusion/lateral plate;  Surgeon: 12/28/2018, MD;  Location: [facility] OR;  Service: Neurosurgery;  Laterality: Left;  Anterolateral  . BACK SURGERY  [facility]ett Abu   lumbar fusion 3 times  . CARPAL TUNNEL RELEASE Right 2000   and left side 2 years ago with left ulnar surgery  . COLONOSCOPY    . HERNIA REPAIR Right 2001   inguinal   . REPLACEMENT TOTAL KNEE Left 05/2020   Social History   Social History Narrative  . Not on file   Immunization History  Administered Date(s) Administered  . Influenza-Unspecified 05/05/2014  . Pneumococcal Polysaccharide-23 04/25/2014  . Zoster 04/25/2014     Objective: Vital Signs: There were no vitals taken for this visit.   Physical Exam   Musculoskeletal Exam: ***  CDAI Exam: CDAI Score: -- Patient Global: --; Provider Global: -- Swollen: --; Tender: -- Joint Exam 09/19/2020   No joint exam has been documented for this visit   There is currently no information documented on the homunculus. Go to the Rheumatology activity and complete the homunculus joint exam.  Investigation: No additional findings.  Imaging: No results found.  Recent  Labs: Lab Results  Component Value Date   WBC 11.4 (H) 07/23/2020   HGB 15.1 07/23/2020   PLT 248 07/23/2020   NA 139 07/23/2020   K 3.8 07/23/2020   CL 104 07/23/2020   CO2 27 07/23/2020   GLUCOSE 93 07/23/2020   BUN 23 07/23/2020   CREATININE 0.87 07/23/2020   BILITOT 0.3 07/23/2020   ALKPHOS 28 (L)  11/24/2017   AST 11 07/23/2020   ALT 19 07/23/2020   PROT 5.9 (L) 07/23/2020   ALBUMIN 3.0 (L) 11/24/2017   CALCIUM 9.4 07/23/2020   GFRAA 113 07/23/2020   QFTBGOLDPLUS NEGATIVE 07/23/2020    Speciality Comments: Prior therapy: MTX, PLQ, Enbrel, Humira, and Orencia (inadequate response). Due to his history of diverticulitis, Harriette Ohara is not an option. Simponi-rash and fatigue  Procedures:  No procedures performed Allergies: Eggs or egg-derived products   Assessment / Plan:     Visit Diagnoses: No diagnosis found.  Orders: No orders of the defined types were placed in this encounter.  No orders of the defined types were placed in this encounter.   Face-to-face time spent with patient was *** minutes. Greater than 50% of time was spent in counseling and coordination of care.  Follow-Up Instructions: No follow-ups on file.   Ellen Henri, CMA  Note - This record has been created using Animal nutritionist.  Chart creation errors have been sought, but may not always  have been located. Such creation errors do not reflect on  the standard of medical care.

## 2020-09-19 ENCOUNTER — Ambulatory Visit: Payer: BC Managed Care – PPO | Admitting: Physician Assistant

## 2020-09-19 DIAGNOSIS — E2740 Unspecified adrenocortical insufficiency: Secondary | ICD-10-CM

## 2020-09-19 DIAGNOSIS — Z96652 Presence of left artificial knee joint: Secondary | ICD-10-CM

## 2020-09-19 DIAGNOSIS — M19041 Primary osteoarthritis, right hand: Secondary | ICD-10-CM

## 2020-09-19 DIAGNOSIS — M170 Bilateral primary osteoarthritis of knee: Secondary | ICD-10-CM

## 2020-09-19 DIAGNOSIS — M66331 Spontaneous rupture of flexor tendons, right forearm: Secondary | ICD-10-CM

## 2020-09-19 DIAGNOSIS — Z8719 Personal history of other diseases of the digestive system: Secondary | ICD-10-CM

## 2020-09-19 DIAGNOSIS — Z79899 Other long term (current) drug therapy: Secondary | ICD-10-CM

## 2020-09-19 DIAGNOSIS — M5030 Other cervical disc degeneration, unspecified cervical region: Secondary | ICD-10-CM

## 2020-09-19 DIAGNOSIS — Z86010 Personal history of colonic polyps: Secondary | ICD-10-CM

## 2020-09-19 DIAGNOSIS — M0609 Rheumatoid arthritis without rheumatoid factor, multiple sites: Secondary | ICD-10-CM

## 2020-09-19 DIAGNOSIS — M19071 Primary osteoarthritis, right ankle and foot: Secondary | ICD-10-CM

## 2020-09-19 DIAGNOSIS — M47816 Spondylosis without myelopathy or radiculopathy, lumbar region: Secondary | ICD-10-CM

## 2020-09-19 DIAGNOSIS — M5136 Other intervertebral disc degeneration, lumbar region: Secondary | ICD-10-CM

## 2020-10-01 NOTE — Progress Notes (Signed)
Office Visit Note  Patient: Gregory Hinton             Date of Birth: 09-04-1966           MRN: [id number]             PCP: [facility] Referring: [facility] Visit Date: 10/07/2020 Occupation: @  Subjective:  Discuss restarting medications   History of Present Illness: Gregory Hinton is a 54 y.o. male with history of seronegative rheumatoid arthritis, osteoarthritis, and DDD.  He was diagnosed with covid-19 at the end of November and received the monoclonal antibody infusion several days later.  He states his fever resolved after the infusion. He is continues to experience fatigue, shortness of breath, and a productive cough.  He has been using a nebulizer at home and took a zpak initially.  He has ongoing GI upset since taking the zpak and he plans on scheduling an appointment with Dr. Chales Abrahams.   He has been holding [place] as instructed and has not resumed these medications yet.  He is currently on prednisone 7.5 mg daily.  He is having pain in both hands and both shoulder joints.  He states he has some stiffness in his left knee replacement but has been trying to work on ROM and strengthening exercises.       Activities of Daily Living:  Patient reports morning stiffness for all day.   Patient Denies nocturnal pain.  Difficulty dressing/grooming: Denies Difficulty climbing stairs: Reports Difficulty getting out of chair: Denies Difficulty using hands for taps, buttons, cutlery, and/or writing: Reports  Review of Systems  Constitutional: Positive for fatigue.  HENT: Negative for mouth sores, mouth dryness and nose dryness.   Eyes: Negative for pain, itching and dryness.  Respiratory: Positive for shortness of breath and difficulty breathing. Negative for cough and wheezing.   Cardiovascular: Negative for chest pain and palpitations.  Gastrointestinal: Positive for constipation and diarrhea. Negative for blood in stool.   Endocrine: Negative for increased urination.  Genitourinary: Negative for difficulty urinating.  Musculoskeletal: Positive for arthralgias, joint pain, joint swelling, myalgias, muscle weakness, morning stiffness, muscle tenderness and myalgias.  Skin: Negative for color change, rash and redness.  Neurological: Positive for numbness, headaches and weakness. Negative for dizziness and memory loss.  Hematological: Positive for bruising/bleeding tendency.  Psychiatric/Behavioral: Negative for confusion.    PMFS History:  Patient Active Problem List   Diagnosis Date Noted  . High risk medication use 04/19/2018  . History of diverticulitis 05/04/2017  . History of colon polyps 05/04/2017  . Primary osteoarthritis of both hands 05/04/2017  . Primary osteoarthritis of both feet 05/04/2017  . Primary osteoarthritis of both knees 04/02/2017  . Class 1 obesity without serious comorbidity with body mass index (BMI) of 32.0 to 32.9 in adult 10/14/2016  . Spondylosis of lumbar region without myelopathy or radiculopathy 10/14/2016  . DJD (degenerative joint disease), cervical 10/14/2016  . High risk medications (not anticoagulants) long-term use 10/14/2016  . Rheumatoid arthritis, seronegative, multiple sites (HCC) 10/14/2016  . Lumbar stenosis with neurogenic claudication 12/17/2015    Past Medical History:  Diagnosis Date  . Adrenal insufficiency (HCC)   . Anxiety   . Arthritis   . Back pain    had 3 fusions surgeries  . H/O cardiovascular stress test 2014   admitted at hosp. overnight for chest pain, stress/echo test wnl  . Hypertension   . PONV (postoperative nausea and vomiting)   .  Rheumatoid arthritis (HCC)    manages by Dr. Corliss Skains, rec'd Orencia weekly for 2 months, switched from Humira     Family History  Problem Relation Age of Onset  . Aneurysm Mother   . Leukemia Father   . Healthy Daughter   . Healthy Son    Past Surgical History:  Procedure Laterality Date  .  ANTERIOR LAT LUMBAR FUSION Left 12/26/2018   Procedure: Left Lumbar Two-Three Anterolateral decompression/interbody fusion/lateral plate;  Surgeon: Barnett Abu, MD;  Location: [facility] OR;  Service: Neurosurgery;  Laterality: Left;  Anterolateral  . BACK SURGERY  [id number]   lumbar fusion 3 times  . CARPAL TUNNEL RELEASE Right 2000   and left side 2 years ago with left ulnar surgery  . COLONOSCOPY    . HERNIA REPAIR Right 2001   inguinal   . REPLACEMENT TOTAL KNEE Left 05/2020   Social History   Social History Narrative  . Not on file   Immunization History  Administered Date(s) Administered  . Influenza-Unspecified 05/05/2014  . Pneumococcal Polysaccharide-23 04/25/2014  . Zoster 04/25/2014     Objective: Vital Signs: BP (!) 152/102 (BP Location: Left Arm, Patient Position: Sitting, Cuff Size: Normal)   Pulse 87   Resp 17   Ht 6\' 4"  (1.93 m)   Wt 288 lb 9.6 oz (130.9 kg)   BMI 35.13 kg/m    Physical Exam Vitals and nursing note reviewed.  Constitutional:      Appearance: He is well-developed and well-nourished.  HENT:     Head: Normocephalic and atraumatic.  Eyes:     Extraocular Movements: EOM normal.     Conjunctiva/sclera: Conjunctivae normal.     Pupils: Pupils are equal, round, and reactive to light.  Pulmonary:     Effort: Pulmonary effort is normal.  Abdominal:     Palpations: Abdomen is soft.  Musculoskeletal:     Cervical back: Normal range of motion and neck supple.  Skin:    General: Skin is warm and dry.     Capillary Refill: Capillary refill takes less than 2 seconds.  Neurological:     Mental Status: He is alert and oriented to person, place, and time.  Psychiatric:        Mood and Affect: Mood and affect normal.        Behavior: Behavior normal.      Musculoskeletal Exam: C-spine limited ROM with lateral rotation.  Postural thoracic kyphosis.  Shoulder joint abduction to about 90 degrees bilaterally.  Elbow joints good ROM with no inflammation.  Tenderness and mild inflammation on the left wrist joint.  Tenderness and mild synovitis of the right 2nd and 3rd MCPs and left 2nd MCP.  Tenderness over the right 1st and 4th MCPs and left 1st and 3rd MCPs. Left knee replacement has good ROM with warmth.  Right knee has good ROM with no warmth or effusion.  Tenderness of both ankle joints.  Pedal edema noted bilaterally.   CDAI Exam: CDAI Score: 15.2  Patient Global: 6 mm; Provider Global: 6 mm Swollen: 4 ; Tender: 12  Joint Exam 10/07/2020      Right  Left  Glenohumeral   Tender   Tender  Wrist     Swollen Tender  MCP 1   Tender   Tender  MCP 2  Swollen Tender  Swollen Tender  MCP 3  Swollen Tender   Tender  MCP 4   Tender     Ankle   Tender   Tender  Investigation: No additional findings.  Imaging: DG Chest 2 View  Result Date: 10/07/2020 CLINICAL DATA:  Cough and shortness of. Reported recent COVID-19 positive EXAM: CHEST - 2 VIEW COMPARISON:  April 17, 2020 FINDINGS: Ill-defined airspace opacity is noted in portions of each mid lung and base region without consolidation. Heart size and pulmonary vascularity are normal. No adenopathy. No bone lesions. IMPRESSION: Patchy airspace opacity bilaterally, likely due to atypical organism pneumonia. Cardiac silhouette within normal limits. No evident adenopathy. Electronically Signed   By: Bretta Bang III M.D.   On: 10/07/2020 10:41    Recent Labs: Lab Results  Component Value Date   WBC 11.4 (H) 07/23/2020   HGB 15.1 07/23/2020   PLT 248 07/23/2020   NA 139 07/23/2020   K 3.8 07/23/2020   CL 104 07/23/2020   CO2 27 07/23/2020   GLUCOSE 93 07/23/2020   BUN 23 07/23/2020   CREATININE 0.87 07/23/2020   BILITOT 0.3 07/23/2020   ALKPHOS 28 (L) 11/24/2017   AST 11 07/23/2020   ALT 19 07/23/2020   PROT 5.9 (L) 07/23/2020   ALBUMIN 3.0 (L) 11/24/2017   CALCIUM 9.4 07/23/2020   GFRAA 113 07/23/2020   QFTBGOLDPLUS NEGATIVE 07/23/2020    Speciality Comments: Prior  therapy: MTX, PLQ, Enbrel, Humira, and Orencia (inadequate response). Due to his history of diverticulitis, Harriette Ohara is not an option. Simponi-rash and fatigue  Procedures:  No procedures performed Allergies: Eggs or egg-derived products   Assessment / Plan:     Visit Diagnoses: Rheumatoid arthritis, seronegative, multiple sites [facility]): He has joint tenderness and inflammation in multiple joints as discussed above.  He has been experiencing increased discomfort and stiffness in both shoulders and both hands since holding Orencia and Arava as recommended after being diagnosed with covid-19 infection in November.  He received the monoclonal antibody infusion several days after being diagnosed and has not had a fever since then.  He continues to have significant fatigue, shortness of breath, and a productive cough.  He was sent for a chest x-ray after his office visit this morning and was found to have bilateral opacities consistent with atypical pneumonia. An urgent referral to [facility] pulmonology was placed today.  He was advised to continue to hold Orencia and Arava until the infection has completely resolved and he has been cleared by pulmonology.  He will continue to take prednisone 7.5 mg daily (unable to further taper due to hx of adrenal insufficiency.  He will follow up in our office in 2 months.   High risk medication use -Currently holding Orencia 125 mg subcutaneous injections once weekly and Arava 20 mg 1 tablet by mouth daily. Diagnosed with covid in November.  CXR today revealed findings consistent with atypical organism pneumonia.  He will continue to hold orencia and arava until the infection has resolved and he has been cleared by pulmonary.    - Plan: Ambulatory referral to Pulmonology Lab work from 09/19/20 was reviewed today in the office. TB gold negative on 07/23/20 and will continue to be monitored yearly.   Spontaneous rupture of flexor tendon of right forearm: Repaired.  Primary  osteoarthritis of both hands: He has PIP and DIP thickening consistent with osteoarthritis of both hands.  He has been experiencing increased tenderness and stiffness in both hands recently.  Joint protection and muscle strengthening were discussed.  Primary osteoarthritis of right knee: Right knee joint has good range of motion with no warmth or effusion.  Status post total left knee replacement -  August 2021 by Dr. Charlann Boxer.  He has been experiencing some increased stiffness in the left knee which is replaced since he has had to be more sedentary since having Covid.  He has been working on range of motion and strengthening exercises.   Primary osteoarthritis of both feet: He continues to experience discomfort in both feet due to underlying neuropathy.  He has tenderness to palpation over bilateral ankle joints. Pedal edema noted bilaterally.   DDD (degenerative disc disease), cervical: Limited ROM with lateral rotation bilaterally.  DDD (degenerative disc disease), lumbar: Chronic pain.   Spondylosis of lumbar region without myelopathy or radiculopathy: Chronic pain.   History of COVID-19 - Diagnosed at the end of November 2021.  He received the monoclonal antibody infusion several days after being diagnosed. His fever resolved 1 day after receiving the infusion.  He was initially treated with a zpak and has been using a nebulizer at home.  He has ongoing shortness of breath, productive cough, and fatigue.  CXR ordered today which revealed findings consistent with atypical organism pneumonia.  Urgent referral to [place] [facility] placed today.  He was advised to continue to hold arava and orencia until he has been cleared by pulmonary to resume.  Plan: DG Chest 2 View  Shortness of breath -He has ongoing shortness of breath and a productive cough following covid-19 infection at the end of November 2021.  He has not had any recent fevers.  He continues to use his nebulizer at home.  CXR ordered today.   I called the patient to review results: patchy airspace opacity bilaterally likely due to atypical organism pneumonia.  Urgent referral to [place] [facility] placed today for further evaluation and management.  He will continue to hold Orencia and Arava until the infection has completely cleared.  Plan: DG Chest 2 View, Ambulatory referral to Pulmonology  Other medical conditions are listed as follows:   History of diverticulitis  History of colon polyps  Adrenal insufficiency (HCC) - He is followed by endocrinologist.  Currently he is on prednisone 7.5 mg p.o. daily.    Orders: Orders Placed This Encounter  Procedures  . DG Chest 2 View  . Ambulatory referral to Pulmonology   No orders of the defined types were placed in this encounter.     Follow-Up Instructions: Return in about 3 months (around 01/05/2021) for Rheumatoid arthritis, Osteoarthritis, DDD.   Gearldine Bienenstock, PA-C  Note - This record has been created using Dragon software.  Chart creation errors have been sought, but may not always  have been located. Such creation errors do not reflect on  the standard of medical care.

## 2020-10-05 HISTORY — PX: BACK SURGERY: SHX140

## 2020-10-07 ENCOUNTER — Ambulatory Visit (HOSPITAL_COMMUNITY)
Admission: RE | Admit: 2020-10-07 | Discharge: 2020-10-07 | Disposition: A | Discharge status: Home / Self Care | Payer: BC Managed Care – PPO | Source: Ambulatory Visit | Attending: Physician Assistant | Admitting: Physician Assistant

## 2020-10-07 ENCOUNTER — Ambulatory Visit (INDEPENDENT_AMBULATORY_CARE_PROVIDER_SITE_OTHER): Payer: BC Managed Care – PPO | Admitting: Physician Assistant

## 2020-10-07 ENCOUNTER — Other Ambulatory Visit: Payer: Self-pay

## 2020-10-07 ENCOUNTER — Encounter: Payer: Self-pay | Admitting: Physician Assistant

## 2020-10-07 VITALS — BP 152/102 | HR 87 | Resp 17 | Ht 76.0 in | Wt 288.6 lb

## 2020-10-07 DIAGNOSIS — M19041 Primary osteoarthritis, right hand: Secondary | ICD-10-CM | POA: Diagnosis not present

## 2020-10-07 DIAGNOSIS — M19071 Primary osteoarthritis, right ankle and foot: Secondary | ICD-10-CM

## 2020-10-07 DIAGNOSIS — M19072 Primary osteoarthritis, left ankle and foot: Secondary | ICD-10-CM

## 2020-10-07 DIAGNOSIS — Z860100 Personal history of colon polyps, unspecified: Secondary | ICD-10-CM

## 2020-10-07 DIAGNOSIS — Z96652 Presence of left artificial knee joint: Secondary | ICD-10-CM

## 2020-10-07 DIAGNOSIS — M66331 Spontaneous rupture of flexor tendons, right forearm: Secondary | ICD-10-CM | POA: Diagnosis not present

## 2020-10-07 DIAGNOSIS — Z8719 Personal history of other diseases of the digestive system: Secondary | ICD-10-CM

## 2020-10-07 DIAGNOSIS — M51369 Other intervertebral disc degeneration, lumbar region without mention of lumbar back pain or lower extremity pain: Secondary | ICD-10-CM

## 2020-10-07 DIAGNOSIS — R0602 Shortness of breath: Secondary | ICD-10-CM | POA: Diagnosis present

## 2020-10-07 DIAGNOSIS — E2740 Unspecified adrenocortical insufficiency: Secondary | ICD-10-CM

## 2020-10-07 DIAGNOSIS — M47816 Spondylosis without myelopathy or radiculopathy, lumbar region: Secondary | ICD-10-CM

## 2020-10-07 DIAGNOSIS — Z8616 Personal history of COVID-19: Secondary | ICD-10-CM

## 2020-10-07 DIAGNOSIS — M5136 Other intervertebral disc degeneration, lumbar region: Secondary | ICD-10-CM

## 2020-10-07 DIAGNOSIS — M0609 Rheumatoid arthritis without rheumatoid factor, multiple sites: Secondary | ICD-10-CM | POA: Diagnosis not present

## 2020-10-07 DIAGNOSIS — M19042 Primary osteoarthritis, left hand: Secondary | ICD-10-CM

## 2020-10-07 DIAGNOSIS — Z79899 Other long term (current) drug therapy: Secondary | ICD-10-CM | POA: Diagnosis not present

## 2020-10-07 DIAGNOSIS — M5030 Other cervical disc degeneration, unspecified cervical region: Secondary | ICD-10-CM

## 2020-10-07 DIAGNOSIS — M1711 Unilateral primary osteoarthritis, right knee: Secondary | ICD-10-CM

## 2020-10-07 IMAGING — DX DG CHEST 2V
2 series · 2 of 2 positions shown · non-contrast
Comparison: [DATE]

CLINICAL DATA: Cough and shortness of. Reported recent [BW]
positive

EXAM:
CHEST - 2 VIEW

[chest pa]
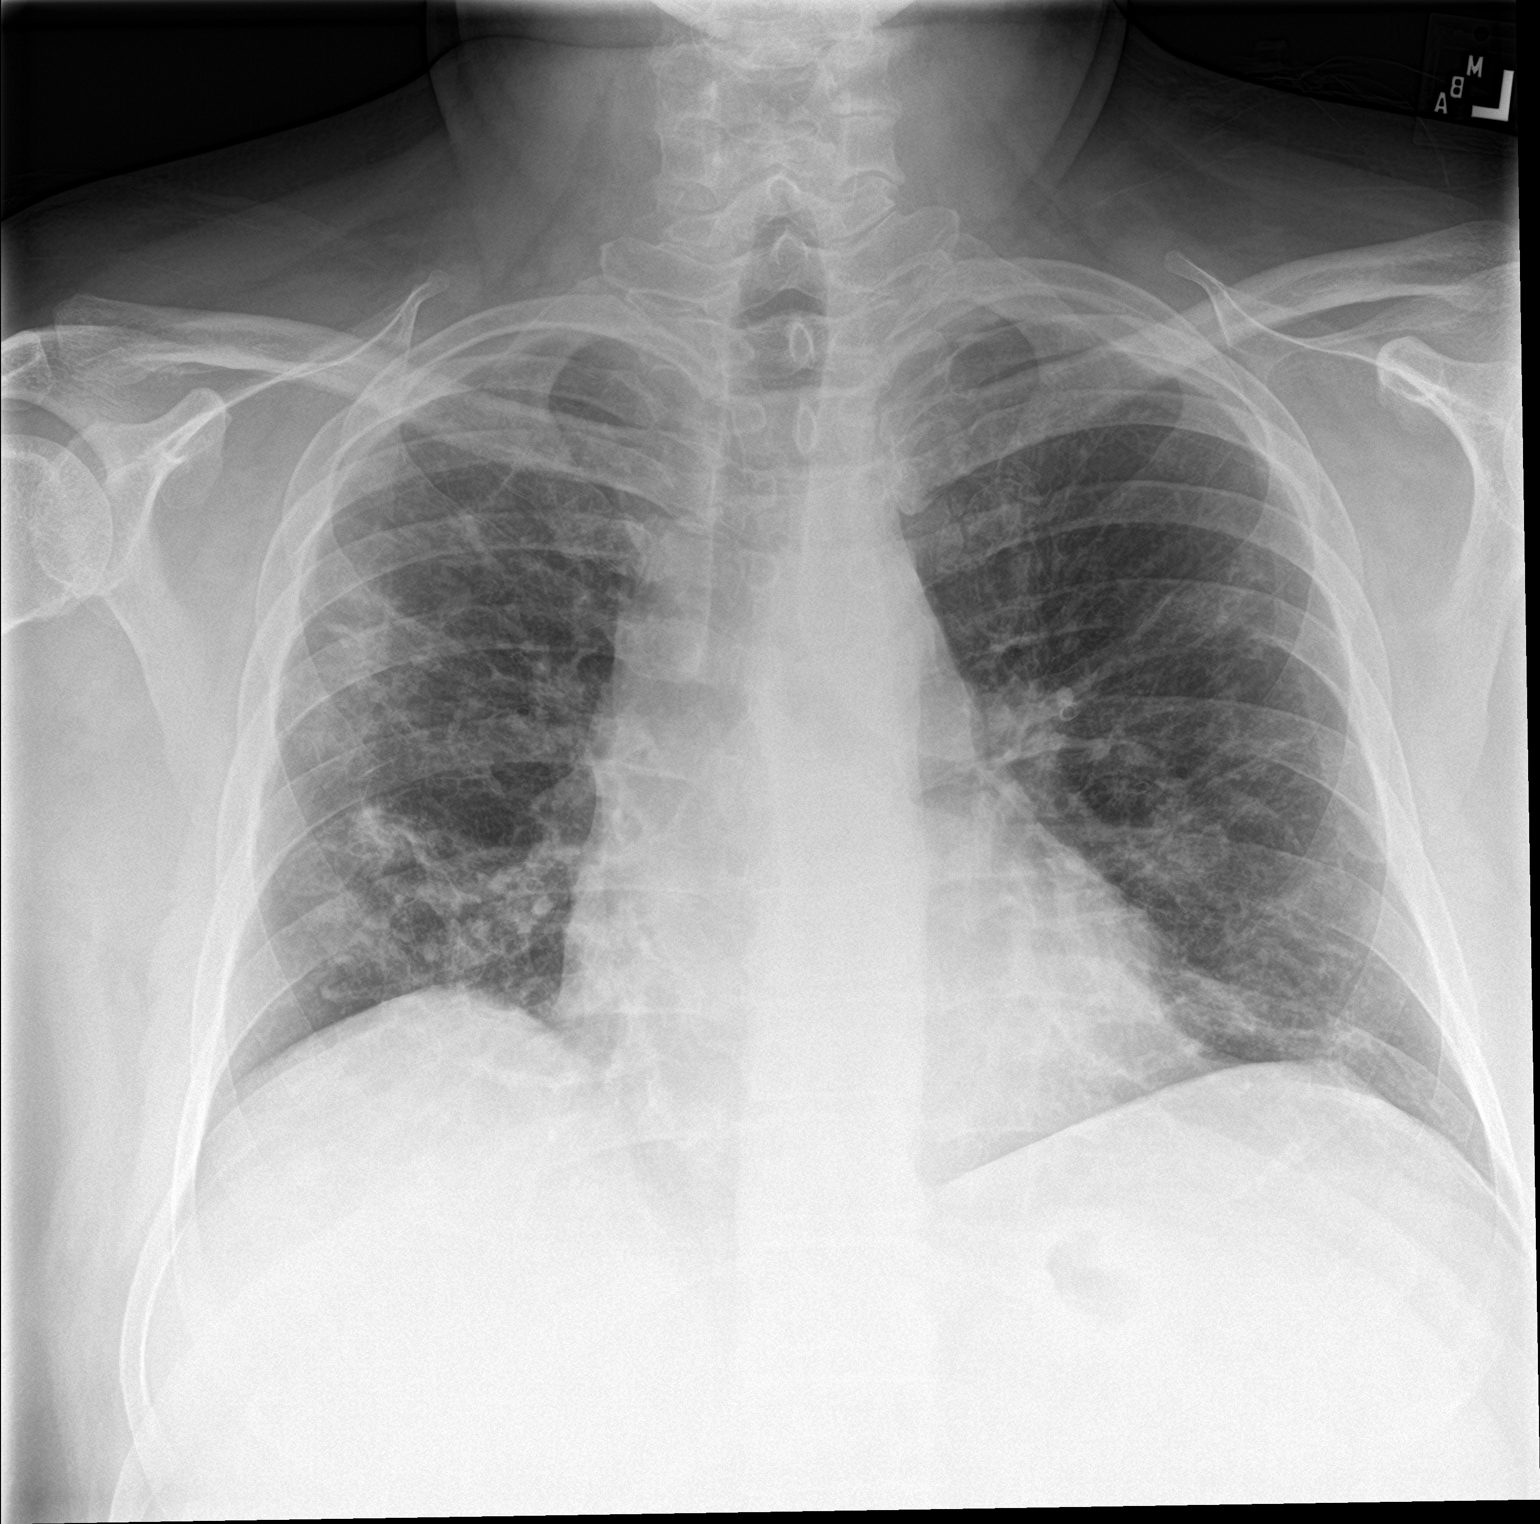

[chest lat]
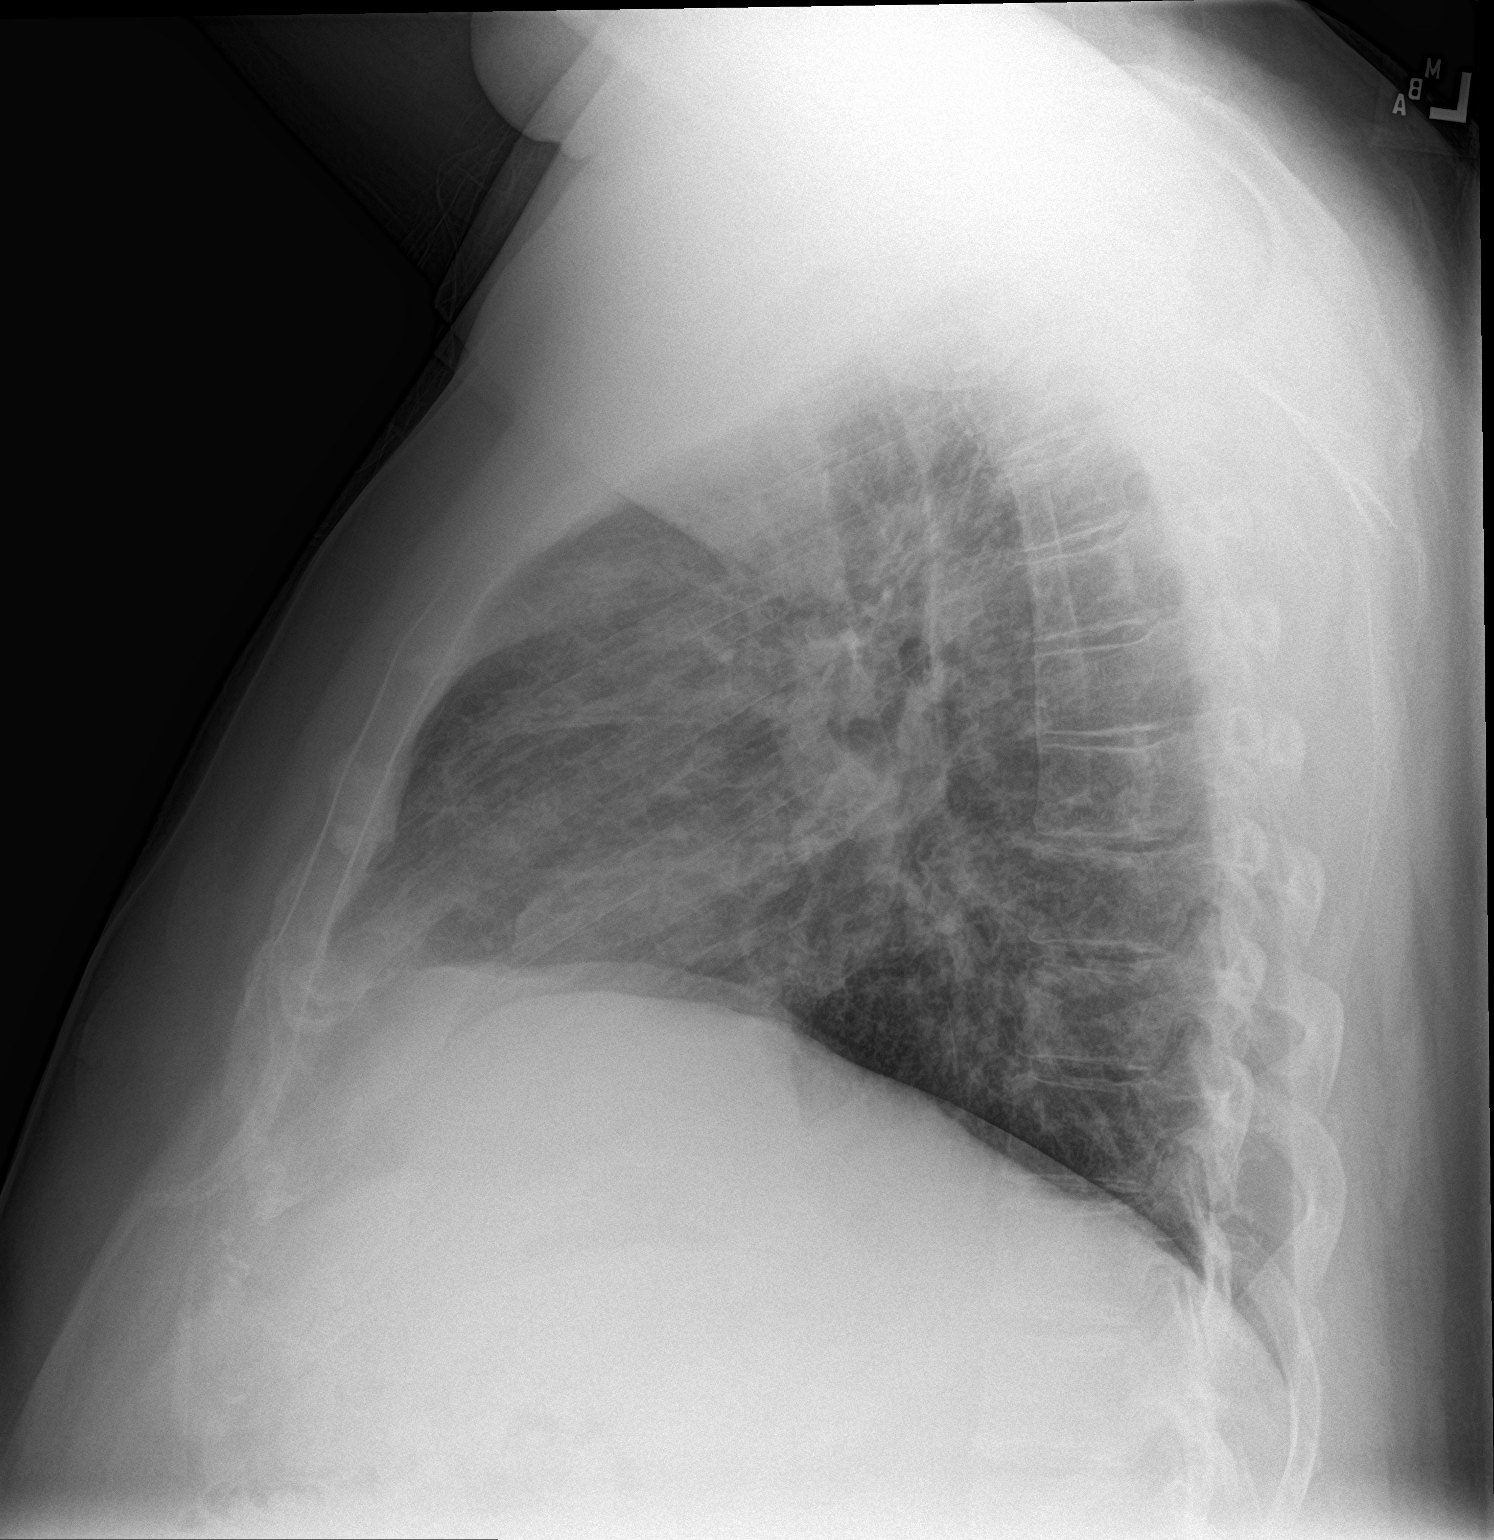

[2 of 2 positions shown; findings below may reference images not displayed]

FINDINGS: Ill-defined airspace opacity is noted in portions of each mid lung
and base region without consolidation. Heart size and pulmonary
vascularity are normal. No adenopathy. No bone lesions.
IMPRESSION: Patchy airspace opacity bilaterally, likely due to atypical organism
pneumonia. Cardiac silhouette within normal limits. No evident
adenopathy.

## 2020-10-07 NOTE — Progress Notes (Signed)
Results reviewed with Dr. Corliss Skains and the patient.  All questions were addressed.   Patient was advised to continue to hold orencia and arava until the infection has cleared and he has been cleared by pulmonology.

## 2020-10-07 NOTE — Progress Notes (Signed)
CXR revealed patchy airspace opacity bilaterally, likely due to atypical organism pneumonia.    I will call the patient to notify him of the results.   He will need to be seen urgently at [place] [facility].  Please place referral and call to schedule appointment ASAP.

## 2020-10-07 NOTE — Patient Instructions (Signed)
Standing Labs We placed an order today for your standing lab work.   Please have your standing labs drawn in March and every 3 months    If possible, please have your labs drawn 2 weeks prior to your appointment so that the provider can discuss your results at your appointment.  We have open lab daily Monday through Thursday from 8:30-12:30 PM and 1:30-4:30 PM and Friday from 8:30-12:30 PM and 1:30-4:00 PM at the office of Dr. Shaili Deveshwar, [facility].   Please be advised, patients with office appointments requiring lab work will take precedents over walk-in lab work.  If possible, please come for your lab work on Monday and Friday afternoons, as you may experience shorter wait times. The office is located at [address], [address], [place], [place] [address] No appointment is necessary.   Labs are drawn by [facility]. Please bring your co-pay at the time of your lab draw.  You may receive a bill from [facility] for your lab work.  If you wish to have your labs drawn at another location, please call the office 24 hours in advance to send orders.  If you have any questions regarding directions or hours of operation,  please call [phone].   As a reminder, please drink plenty of water prior to coming for your lab work. Thanks!   

## 2020-10-08 ENCOUNTER — Ambulatory Visit (INDEPENDENT_AMBULATORY_CARE_PROVIDER_SITE_OTHER): Payer: BC Managed Care – PPO | Admitting: Pulmonary Disease

## 2020-10-08 ENCOUNTER — Encounter: Payer: Self-pay | Admitting: Pulmonary Disease

## 2020-10-08 DIAGNOSIS — M0609 Rheumatoid arthritis without rheumatoid factor, multiple sites: Secondary | ICD-10-CM | POA: Diagnosis not present

## 2020-10-08 DIAGNOSIS — J1282 Pneumonia due to coronavirus disease 2019: Secondary | ICD-10-CM

## 2020-10-08 DIAGNOSIS — U071 COVID-19: Secondary | ICD-10-CM

## 2020-10-08 MED ORDER — CEFDINIR 300 MG PO CAPS: 300.0000 mg | ORAL_CAPSULE | Freq: Two times a day (BID) | ORAL | 0 refills | Status: DC

## 2020-10-08 NOTE — Patient Instructions (Signed)
Chest x-ray markings are likely due to Covid pneumonia and may take up to 3 months or longer to fully resolve We will treat you with Omnicef 300 mg twice daily for 7 days Call us if breathing is worse, fevers or oxygen level drops less than 88%

## 2020-10-08 NOTE — Assessment & Plan Note (Signed)
Agree with holding Orencia and Arava for now.  He is symptomatically improved and I doubt this is active bacterial pneumonia but would still hold his medications for 4 to 6 weeks

## 2020-10-08 NOTE — Progress Notes (Signed)
Subjective:    Patient ID: Gregory Hinton, male    DOB: Mar 30, 1966, 55 y.o.   MRN: [id number]  HPI   55 year old with rheumatoid arthritis presents for evaluation of pneumonia. He was at his rheumatology appointment yesterday, underwent a chest x-ray and was told he has a pneumonia.  PMH -seronegative RA x 2015, Covid infection 08/2020, left TKR 05/2020 , renal insufficiency  He was diagnosed with COVID-19 end of November received monoclonal antibody infusion with resolution of fever. He saw concierge doctor, oxygen saturation was as low as 86%, received nebs, steroid course and Z-Pak  but he continued to have shortness of breath, productive cough and fatigue.  He was instructed to hold Orencia and Arava during this illness, he is maintained on 7.5 mg of prednisone  I reviewed chest x-ray 1/3 which shows bilateral patchy infiltrates.  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  He reports nasal drainage yellow and occasional white sputum. He denies fevers or pleuritic chest pain or hemoptysis He smoked half pack per day for about 20 years, 10 pack years before he quit in 99. 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 Adrenal insufficiency (HCC)   . Anxiety   . Arthritis   . Back pain    had 3 fusions surgeries  . H/O cardiovascular stress test 2014   admitted at hosp. overnight for chest pain, stress/echo test wnl  . Hypertension   . PONV (postoperative nausea and vomiting)   . Rheumatoid arthritis (HCC)    manages by Dr. Corliss Skains, rec'd Orencia weekly for 2 months, switched from Humira    Past Surgical History:  Procedure Laterality Date  . ANTERIOR LAT LUMBAR FUSION Left 12/26/2018   Procedure: Left Lumbar Two-Three Anterolateral decompression/interbody fusion/lateral plate;   Surgeon: Barnett Abu, MD;  Location: [facility] OR;  Service: Neurosurgery;  Laterality: Left;  Anterolateral  . BACK SURGERY  [id number]   lumbar fusion 3 times  . CARPAL TUNNEL RELEASE Right 2000   and left side 2 years ago with left ulnar surgery  . COLONOSCOPY    . HERNIA REPAIR Right 2001   inguinal   . REPLACEMENT TOTAL KNEE Left 05/2020    Allergies  Allergen Reactions  . Eggs Or Egg-Derived Products Diarrhea and Nausea And Vomiting    Social History   Socioeconomic History  . Marital status: Married    Spouse name: Not on file  . Number of children: Not on file  . Years of education: Not on file  . Highest education level: Not on file  Occupational History  . Not on file  Tobacco Use  . Smoking status: Former Smoker    Quit date: 02/18/1998    Years since quitting: 22.6  . Smokeless tobacco: Never Used  Vaping Use  . Vaping Use: Never used  Substance and Sexual Activity  . Alcohol use: Yes    Comment: rarely  . Drug use: No  . Sexual activity: Not on file  Other Topics Concern  . Not on file  Social History Narrative  . Not on file   Social Determinants of Health   Financial Resource Strain: Not on file  Food Insecurity: Not on file  Transportation Needs: Not on file  Physical Activity: Not on file  Stress: Not on file  Social Connections: Not on file  Intimate Partner Violence: Not on file    Family History  Problem Relation Age of Onset  . Aneurysm Mother   . Leukemia Father   . Healthy Daughter   . Healthy Son       Review of Systems Shortness of breath with activity Chest pain nonpleuritic, nonradiating, nonexertional Feet swelling with edema in spite of taking Lasix. Joint stiffness in both hands and feet Nonproductive cough    Objective:   Physical Exam  Gen. Pleasant, obese, in no distress, normal affect ENT - no pallor,icterus, no post nasal drip, class 2-3 airway Neck: No JVD, no thyromegaly, no carotid bruits Lungs: no use of  accessory muscles, no dullness to percussion, decreased without rales or rhonchi  Cardiovascular: Rhythm regular, heart sounds  normal, no murmurs or gallops, no peripheral edema Abdomen: soft and non-tender, no hepatosplenomegaly, BS normal. Musculoskeletal: No deformities, no cyanosis or clubbing Neuro:  alert, non focal, no tremors        Assessment & Plan:

## 2020-10-08 NOTE — Assessment & Plan Note (Addendum)
Chest x-ray markings are likely due to Covid pneumonia from November and may take up to 3 months or longer to fully resolve We will treat you with Omnicef 300 mg twice daily for 7 days for superimposed bacterial infection Call us if breathing is worse, fevers or oxygen level drops less than 88% Reassuring that he does not desaturate on exertion today

## 2020-11-07 ENCOUNTER — Telehealth: Payer: Self-pay

## 2020-11-07 NOTE — Telephone Encounter (Signed)
Attempted to contact the patient and left message for patient to call the office.   Patient is due to update labs. Need to advise patient we do not have his co-pay card information. He will need to contact [phone] to have them advise what he needs to do.

## 2020-11-07 NOTE — Telephone Encounter (Signed)
Patient states he called [facility] to request a refill for his Orencia.  Patient states they needed his refill number and co-pay card which he says he never received.  Patient states he was told to contact Dr. Fatima Sanger office.

## 2020-11-08 NOTE — Telephone Encounter (Signed)
Attempted to contact patient and left message for patient to call the office.  

## 2020-11-08 NOTE — Telephone Encounter (Signed)
Conferenced called [employer] with patient to request his copay debit card info be resent to him.  Patient will provide information to pharmacy once received.

## 2020-11-25 ENCOUNTER — Ambulatory Visit (INDEPENDENT_AMBULATORY_CARE_PROVIDER_SITE_OTHER): Payer: BC Managed Care – PPO

## 2020-11-25 ENCOUNTER — Ambulatory Visit (INDEPENDENT_AMBULATORY_CARE_PROVIDER_SITE_OTHER): Payer: BC Managed Care – PPO | Admitting: Pulmonary Disease

## 2020-11-25 ENCOUNTER — Other Ambulatory Visit: Payer: Self-pay

## 2020-11-25 ENCOUNTER — Encounter: Payer: Self-pay | Admitting: Pulmonary Disease

## 2020-11-25 VITALS — BP 140/82 | HR 97 | Temp 97.9°F | Ht 76.0 in | Wt 282.0 lb

## 2020-11-25 DIAGNOSIS — J1282 Pneumonia due to coronavirus disease 2019: Secondary | ICD-10-CM

## 2020-11-25 DIAGNOSIS — Z8616 Personal history of COVID-19: Secondary | ICD-10-CM | POA: Diagnosis not present

## 2020-11-25 DIAGNOSIS — U071 COVID-19: Secondary | ICD-10-CM | POA: Diagnosis not present

## 2020-11-25 DIAGNOSIS — M0609 Rheumatoid arthritis without rheumatoid factor, multiple sites: Secondary | ICD-10-CM

## 2020-11-25 IMAGING — DX DG CHEST 2V
2 series · 2 of 2 positions shown · non-contrast
Comparison: [DATE]

CLINICAL DATA: History of COVID

EXAM:
CHEST - 2 VIEW

[chest pa]
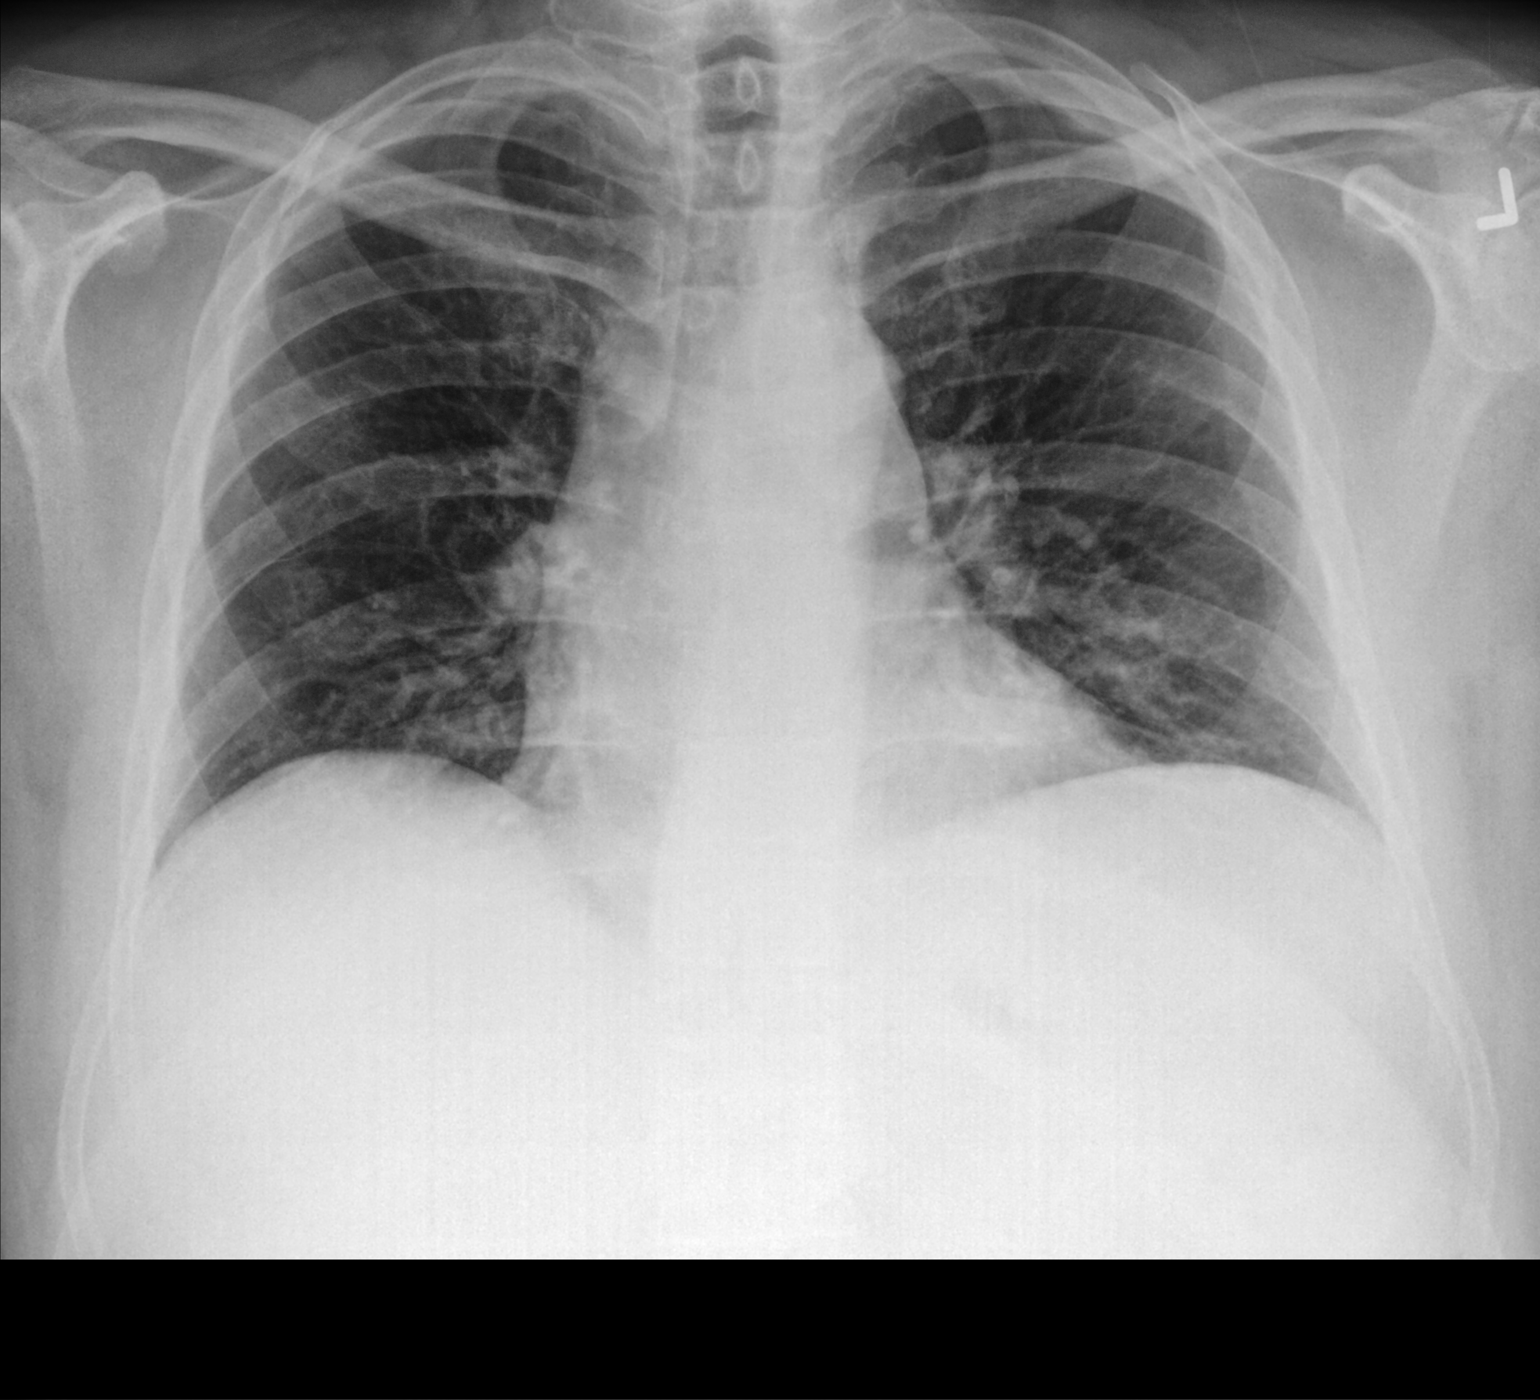

[chest lat]
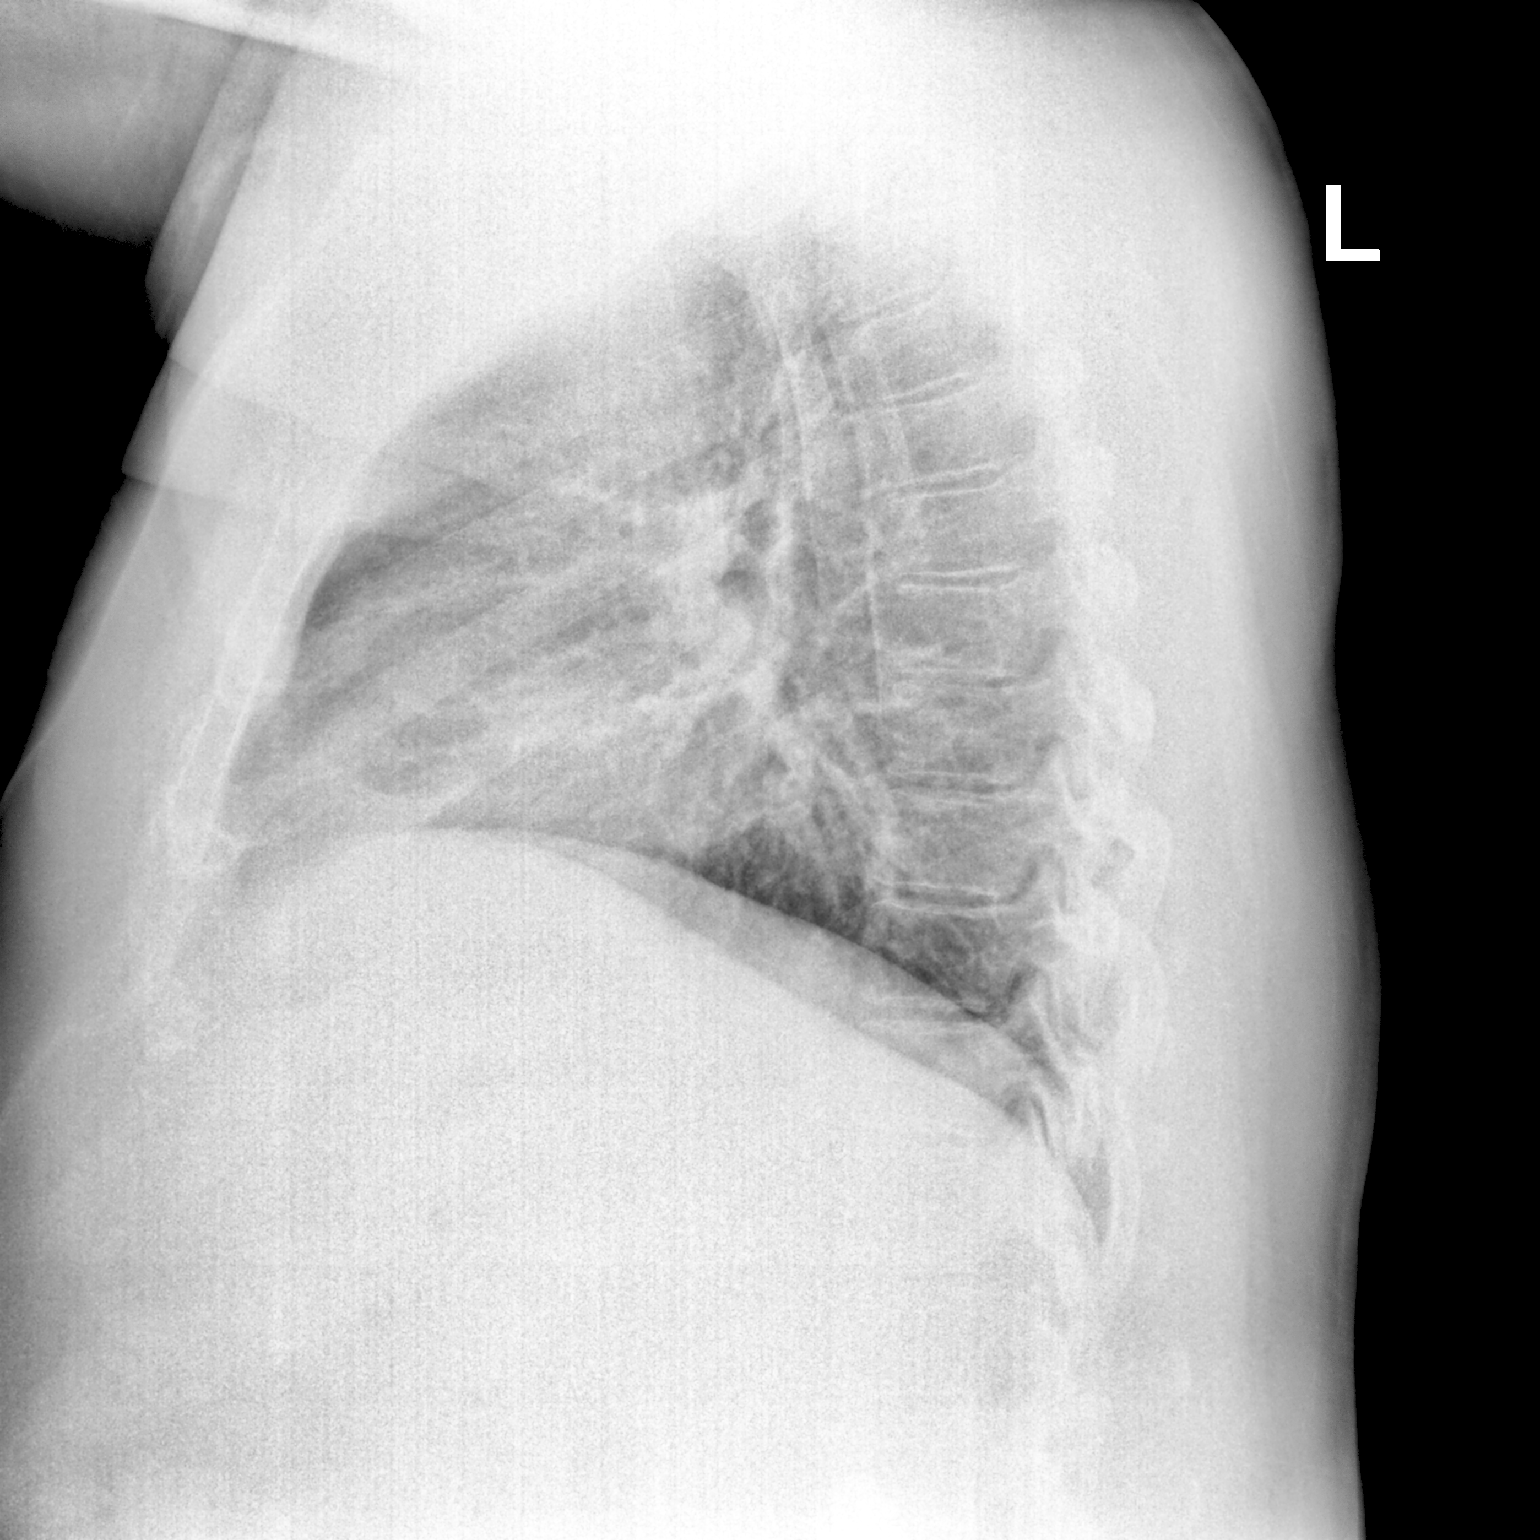

[2 of 2 positions shown; findings below may reference images not displayed]

FINDINGS: Cardiomediastinal silhouette and pulmonary vasculature are within
normal limits.

Interval improvement of aeration of the lungs. Mild opacity remains
at the lung bases.
IMPRESSION: Interval improvement in aeration of the lungs consistent with
resolving pneumonia.

## 2020-11-25 MED ORDER — ALBUTEROL SULFATE HFA 108 (90 BASE) MCG/ACT IN AERS: 2.0000 | INHALATION_SPRAY | Freq: Four times a day (QID) | RESPIRATORY_TRACT | 6 refills | Status: DC | PRN

## 2020-11-25 NOTE — Patient Instructions (Signed)
Trial of albuterol MDI 2 puffs as needed

## 2020-11-25 NOTE — Addendum Note (Signed)
Addended by: Dorisann Frames R on: 11/25/2020 11:15 AM   Modules accepted: Orders

## 2020-11-25 NOTE — Progress Notes (Signed)
   Subjective:    Patient ID: Gregory Hinton, male    DOB: 1966/08/26, 55 y.o.   MRN: [id number]  HPI  55 year old with rheumatoid arthritis for FU of covid pneumonia.   PMH -seronegative RA x 2015, Covid infection 08/2020, left TKR 05/2020 , renal insufficiency  He was diagnosed with COVID-19 end of November received monoclonal antibody infusion with resolution of fever. He saw concierge doctor, oxygen saturation was as low as 86%, received nebs, steroid course and Z-Pak  55 year old continued to have shortness of breath, productive cough and fatigue.  He was instructed to hold Orencia and Arava during this illness, he is maintained on 7.5 mg of prednisone  He smoked half pack per day for about 20 years, 10 pack years before he quit in 62. 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 Follow-up    No complaints     Feels improved, is able to work for longer hours. He started taking Orencia again 3 weeks ago and then had a sinus infection and had to stop.  Remains on prednisone 10 mg daily, joints are not acting up yet. No cough, shortness of breath persists, which is down to [place], his team came 13th  Left knee hurts a lot  chest x-ray 1/3 which shows bilateral patchy infiltrates.  CXR today shows improvement in bilateral infiltrates  Review of Systems neg for any significant sore throat, dysphagia, itching, sneezing, nasal congestion or excess/ purulent secretions, fever, chills, sweats, unintended wt loss, pleuritic or exertional cp, hempoptysis, orthopnea pnd or change in chronic leg swelling. Also denies presyncope, palpitations, heartburn, abdominal pain, nausea, vomiting, diarrhea or change in bowel or urinary habits, dysuria,hematuria, rash, arthralgias, visual complaints, headache, numbness weakness or ataxia.     Objective:   Physical Exam  Gen. Pleasant, well-nourished, in no distress ENT - no  thrush, no pallor/icterus,no post nasal drip Neck: No JVD, no thyromegaly, no carotid bruits Lungs: no use of accessory muscles, no dullness to percussion, clear without rales or rhonchi  Cardiovascular: Rhythm regular, heart sounds  normal, no murmurs or gallops, no peripheral edema Musculoskeletal: No deformities, no cyanosis or clubbing        Assessment & Plan:

## 2020-11-25 NOTE — Assessment & Plan Note (Signed)
Chest x-ray shows improvement in infiltrates.  His symptoms are improved.  We will give him an albuterol prescription to keep handy in case he has to work long hours.  I demonstrated technique of use to him. If he has persistent shortness of breath after May, he will call for follow-up appointment.  I discussed possibility of post Covid scarring and lung disease due to rheumatoid arthritis itself which we can investigate if symptoms persist

## 2020-11-25 NOTE — Assessment & Plan Note (Signed)
Okay to resume Orencia from pulmonary standpoint

## 2020-12-03 ENCOUNTER — Other Ambulatory Visit: Payer: Self-pay | Admitting: Physician Assistant

## 2020-12-03 NOTE — Telephone Encounter (Signed)
Last Visit: 10/07/2020 Next Visit: 01/06/2021 Labs: 07/23/2020, CBC and CMP is stable. Anemia noted. Please advise patient to take multivitamin with iron and follow-up with his PCP. Patient called to have labs updated. He will come in this week. TB Gold: 07/23/2020, negative  Current Dose per office note 10/07/2020,  Orencia 125 mg subcutaneous injections once weekly   BM:[id number] arthritis, seronegative, multiple sites   Last Fill: 08/02/2020  Okay to refill Orencia?

## 2020-12-23 NOTE — Progress Notes (Signed)
Office Visit Note  Patient: Gregory Hinton             Date of Birth: Aug 03, 1966           MRN: [id number]             PCP: [facility] Referring: [facility] Visit Date: 01/06/2021 Occupation: @GUAROCC @  Subjective:  Pain and stiffness in both hands and both ankles  History of Present Illness: Gregory Hinton is a 55 y.o. male with history of seronegative rheumatoid arthritis, osteoarthritis, and DDD.  He is on Orencia 125 mg sq injections every week, Arava 20 mg 1 tablet by mouth daily, and prednisone 7.5 mg by mouth daily. He was cleared to restart on orencia on 11/25/20 by Dr. 11/27/20, so he has been back on combination therapy for about 6 weeks.  His cough and shortness of breath have improved.  He states he has started to notice improvement since restarting Orencia and his overall discomfort has returned to baseline. He continues to have pain and stiffness in both hands and both ankle joints.  He has some redness and swelling in both hands currently.  He states he continues to work 7 days a week which is likely contributing to some of his joint pain as well. Restarted 6 weeks he states that he has been able to take less Advil and Tylenol for pain relief recently.  He states that his left knee replacement is slowly improving and he continues to perform home exercises.  He states that his lower back pain is stable.    Activities of Daily Living:  Patient reports morning stiffness for 1.5-2 hours.   Patient Denies nocturnal pain.  Difficulty dressing/grooming: Denies Difficulty climbing stairs: Denies Difficulty getting out of chair: Denies Difficulty using hands for taps, buttons, cutlery, and/or writing: Denies  Review of Systems  Constitutional: Positive for fatigue.  HENT: Negative for mouth sores, mouth dryness and nose dryness.   Eyes: Negative for pain, itching and dryness.  Respiratory: Negative for shortness of breath and difficulty  breathing.   Cardiovascular: Negative for chest pain and palpitations.  Gastrointestinal: Negative for blood in stool, constipation and diarrhea.  Endocrine: Negative for increased urination.  Genitourinary: Negative for difficulty urinating.  Musculoskeletal: Positive for arthralgias, joint pain, joint swelling and morning stiffness. Negative for myalgias, muscle tenderness and myalgias.  Skin: Negative for color change, rash and redness.  Allergic/Immunologic: Positive for susceptible to infections.  Neurological: Negative for dizziness, numbness, headaches, memory loss and weakness.  Hematological: Positive for bruising/bleeding tendency.  Psychiatric/Behavioral: Negative for confusion.    PMFS History:  Patient Active Problem List   Diagnosis Date Noted  . Pneumonia due to COVID-19 virus 10/08/2020  . High risk medication use 04/19/2018  . History of diverticulitis 05/04/2017  . History of colon polyps 05/04/2017  . Primary osteoarthritis of both hands 05/04/2017  . Primary osteoarthritis of both feet 05/04/2017  . Primary osteoarthritis of both knees 04/02/2017  . Class 1 obesity without serious comorbidity with body mass index (BMI) of 32.0 to 32.9 in adult 10/14/2016  . Spondylosis of lumbar region without myelopathy or radiculopathy 10/14/2016  . DJD (degenerative joint disease), cervical 10/14/2016  . High risk medications (not anticoagulants) long-term use 10/14/2016  . Rheumatoid arthritis, seronegative, multiple sites (HCC) 10/14/2016  . Lumbar stenosis with neurogenic claudication 12/17/2015    Past Medical History:  Diagnosis Date  . Adrenal insufficiency (HCC)   . Anxiety   .  Arthritis   . Back pain    had 3 fusions surgeries  . H/O cardiovascular stress test 2014   admitted at hosp. overnight for chest pain, stress/echo test wnl  . Hypertension   . PONV (postoperative nausea and vomiting)   . Rheumatoid arthritis (HCC)    manages by Dr. Corliss Skains, rec'd  Orencia weekly for 2 months, switched from Humira     Family History  Problem Relation Age of Onset  . Aneurysm Mother   . Leukemia Father   . Healthy Daughter   . Healthy Son    Past Surgical History:  Procedure Laterality Date  . ANTERIOR LAT LUMBAR FUSION Left 12/26/2018   Procedure: Left Lumbar Two-Three Anterolateral decompression/interbody fusion/lateral plate;  Surgeon: Barnett Abu, MD;  Location: [facility] OR;  Service: Neurosurgery;  Laterality: Left;  Anterolateral  . BACK SURGERY  [id number]   lumbar fusion 3 times  . CARPAL TUNNEL RELEASE Right 2000   and left side 2 years ago with left ulnar surgery  . COLONOSCOPY    . HERNIA REPAIR Right 2001   inguinal   . REPLACEMENT TOTAL KNEE Left 05/2020   Social History   Social History Narrative  . Not on file   Immunization History  Administered Date(s) Administered  . Influenza-Unspecified 05/05/2014  . Pneumococcal Polysaccharide-23 04/25/2014  . Zoster 04/25/2014     Objective: Vital Signs: BP (!) 150/100 (BP Location: Left Arm, Patient Position: Sitting, Cuff Size: Normal)   Pulse 91   Resp 17   Ht 6\' 4"  (1.93 m)   Wt 275 lb (124.7 kg)   BMI 33.47 kg/m    Physical Exam Vitals and nursing note reviewed.  Constitutional:      Appearance: He is well-developed.  HENT:     Head: Normocephalic and atraumatic.  Eyes:     Conjunctiva/sclera: Conjunctivae normal.     Pupils: Pupils are equal, round, and reactive to light.  Pulmonary:     Effort: Pulmonary effort is normal.  Abdominal:     Palpations: Abdomen is soft.  Musculoskeletal:     Cervical back: Normal range of motion and neck supple.  Skin:    General: Skin is warm and dry.     Capillary Refill: Capillary refill takes less than 2 seconds.  Neurological:     Mental Status: He is alert and oriented to person, place, and time.  Psychiatric:        Behavior: Behavior normal.      Musculoskeletal Exam: C-spine limited ROM. Painful ROM of lumbar spine.   Shoulder joint abduction to about 90 degrees.  Tenderness over the [facility] joint bilaterally.  Elbow joints, wrist joints, MCPs, PIPs, and DIPs good ROM with no synovitis. Tenderness over the right 2nd and 3rd MCPs, right 3rd PIP, left 1st, 2nd, 3rd, and 5th MCPs and left 5th PIP joints. Some difficulty making a complete fist due to discomfort and stiffness. Left knee replacement good ROM with some discomfort and mild warmth.  Right knee has good ROM with not discomfort, warmth or effusion.  He has good ROM of both ankle joints with tenderness bilaterally.   CDAI Exam: CDAI Score: 8.8  Patient Global: 4 mm; Provider Global: 4 mm Swollen: 0 ; Tender: 12  Joint Exam 01/06/2021      Right  Left  Acromioclavicular   Tender   Tender  MCP 1      Tender  MCP 2   Tender   Tender  MCP 3   Tender  Tender  MCP 5      Tender  PIP 3   Tender     PIP 5      Tender  Ankle   Tender   Tender     Investigation: No additional findings.  Imaging: No results found.  Recent Labs: Lab Results  Component Value Date   WBC 11.4 (H) 07/23/2020   HGB 15.1 07/23/2020   PLT 248 07/23/2020   NA 139 07/23/2020   K 3.8 07/23/2020   CL 104 07/23/2020   CO2 27 07/23/2020   GLUCOSE 93 07/23/2020   BUN 23 07/23/2020   CREATININE 0.87 07/23/2020   BILITOT 0.3 07/23/2020   ALKPHOS 28 (L) 11/24/2017   AST 11 07/23/2020   ALT 19 07/23/2020   PROT 5.9 (L) 07/23/2020   ALBUMIN 3.0 (L) 11/24/2017   CALCIUM 9.4 07/23/2020   GFRAA 113 07/23/2020   QFTBGOLDPLUS NEGATIVE 07/23/2020    Speciality Comments: Prior therapy: MTX, PLQ, Enbrel, Humira, and Orencia (inadequate response). Due to his history of diverticulitis, Harriette Ohara is not an option. Simponi-rash and fatigue  Procedures:  No procedures performed Allergies: Eggs or egg-derived products   Assessment / Plan:     Visit Diagnoses: Rheumatoid arthritis, seronegative, multiple sites [facility]): He has ongoing joint tenderness and stiffness in both hands and both  ankle joints.  He has tenderness of patient over the right 2nd and 3rd MCPs, right 3rd PIP and left 1st, 2nd, 3rd, and 5th MCP joints and left 5th PIP joint.  No synovitis was noted currently.  He has difficulty making a complete fist due to the pain and stiffness in both hands. He has been taking prednisone 7.5 mg by mouth daily (unable to taper due to adrenal insufficiency. He restarted on Orencia 125 mg subcutaneous injections once weekly and Arava 20 mg 1 tablet by mouth daily about 6 weeks ago.  The patient was diagnosed with COVID-19 at the end of November 2021.  He initially received the monoclonal antibody infusion and was treated outpatient with a zpak and used a nebulizer at home.  He was experiencing ongoing SOB and a productive cough and was diagnosed with covid-19 pneumonia on 10/07/20 and was referred to Dr. Vassie Loll who started the patient on Omnicef.  He was cleared by Dr. Vassie Loll on 11/25/20 to resume both medications. He has started to notice a gradual improvement in his joint pain and stiffness since resuming both medications about 6 weeks ago.  He will continue on the current treatment regimen.  He will follow up in 5 months.   High risk medication use - Orencia 125 mg subcutaneous injections once weekly,  Arava 20 mg 1 tablet by mouth daily, and prednisone 7.5 mg by mouth daily. CBC and CMP updated on 09/19/20. He is due to update lab work.  Orders for CBC and CMP released but he will return sometime this week to update lab work. TB gold negative on 07/23/20 and will continue to be monitored yearly.  - Plan: CBC with Differential/Platelet, COMPLETE METABOLIC PANEL WITH GFR He did not receive the covid-19 vaccines. Diagnosed with covid-19 in November. He received the monoclonal antibody infusion . Dx with covid pneumonia on 10/07/20 and was treated with omnicef by Dr. Vassie Loll.  He held [place] from [place] 2022. Cleared by Dr. Vassie Loll on 11/25/20 to restart on Orencia.  Discussed the  importance of holding both orencia and arava whenever her has signs or symptoms of an infection and to resume once the infection  has completely cleared.  Prior therapy: MTX, PLQ, Enbrel, and Humira (inadequate response). Due to his history of diverticulitis, Harriette Ohara is not an option. Simponi-rash and fatigue   Adrenal insufficiency (HCC): Followed by endocrinology.  Currently on prednisone 7.5 mg daily and is unable to taper.  History of COVID-19: He was diagnosed with COVID-19 at the end of November 2021.  He received the monoclonal antibody infusion and was then treated outpatient with and a nebulizer at home.  And nebulizer at home.  He had persistent shortness of breath and a productive cough.  He had a chest x-ray on 10/07/2020 which revealed patchy airspace opacity bilaterally likely due to atypical organism pneumonia.  He was referred to Dr. Vassie Loll who prescribed a course of Omnicef 300 mg twice daily for 7 days.  He had a repeat chest x-ray on 11/25/2020.  He was cleared by Dr. Vassie Loll to restart on Orencia and removal on 11/25/2020.  He has been on both medications for about 6 weeks and has not had any recurrence of infectious symptoms.   Spontaneous rupture of flexor tendon of right forearm: Repaired.  He has good ROM with no discomfort at this time.   Primary osteoarthritis of both hands: He has PIP and DIP thickening consistent with osteoarthritis of both hands. Discussed the importance of joint protection and muscle strengthening.   Primary osteoarthritis of right knee: He has good ROM of the right knee joint with no discomfort.  No warmth or effusion of the right knee joint.  Status post total left knee replacement: Performed by Dr. Charlann Boxer August 2021.  He experienced some increased pain and stiffness in his left knee replacement while having to be more sedentary after being diagnosed with COVID-19 in November 2021.  He has been performing home exercises.  Primary osteoarthritis of both feet: He  has tenderness and discomfort in both ankle joints.  He works 7 days a week which exacerbates his discomfort.  DDD (degenerative disc disease), cervical: He has limited ROM of the C-spine.   DDD (degenerative disc disease), lumbar: Chronic pain.   Spondylosis of lumbar region without myelopathy or radiculopathy: Chronic pain.  Other medical conditions are listed as follows:  History of diverticulitis  History of colon polyps    Orders: Orders Placed This Encounter  Procedures  . CBC with Differential/Platelet  . COMPLETE METABOLIC PANEL WITH GFR   Meds ordered this encounter  Medications  . leflunomide (ARAVA) 20 MG tablet    Sig: Take 1 tablet (20 mg total) by mouth daily.    Dispense:  90 tablet    Refill:  0      Follow-Up Instructions: Return in about 5 months (around 06/08/2021) for Rheumatoid arthritis, Osteoarthritis, DDD.   Gearldine Bienenstock, PA-C  Note - This record has been created using Dragon software.  Chart creation errors have been sought, but may not always  have been located. Such creation errors do not reflect on  the standard of medical care.

## 2021-01-02 ENCOUNTER — Other Ambulatory Visit (HOSPITAL_COMMUNITY): Payer: Self-pay

## 2021-01-04 ENCOUNTER — Other Ambulatory Visit (HOSPITAL_COMMUNITY): Payer: Self-pay

## 2021-01-04 MED FILL — Abatacept Subcutaneous Soln Auto-Injector 125 MG/ML: SUBCUTANEOUS | 28 days supply | Qty: 4 | Fill #0 | Status: AC

## 2021-01-06 ENCOUNTER — Other Ambulatory Visit: Payer: Self-pay

## 2021-01-06 ENCOUNTER — Ambulatory Visit (INDEPENDENT_AMBULATORY_CARE_PROVIDER_SITE_OTHER): Payer: BC Managed Care – PPO | Admitting: Physician Assistant

## 2021-01-06 ENCOUNTER — Encounter: Payer: Self-pay | Admitting: Physician Assistant

## 2021-01-06 VITALS — BP 150/100 | HR 91 | Resp 17 | Ht 76.0 in | Wt 275.0 lb

## 2021-01-06 DIAGNOSIS — M0609 Rheumatoid arthritis without rheumatoid factor, multiple sites: Secondary | ICD-10-CM

## 2021-01-06 DIAGNOSIS — Z8616 Personal history of COVID-19: Secondary | ICD-10-CM

## 2021-01-06 DIAGNOSIS — M19042 Primary osteoarthritis, left hand: Secondary | ICD-10-CM

## 2021-01-06 DIAGNOSIS — M66331 Spontaneous rupture of flexor tendons, right forearm: Secondary | ICD-10-CM

## 2021-01-06 DIAGNOSIS — M19071 Primary osteoarthritis, right ankle and foot: Secondary | ICD-10-CM

## 2021-01-06 DIAGNOSIS — M19041 Primary osteoarthritis, right hand: Secondary | ICD-10-CM | POA: Diagnosis not present

## 2021-01-06 DIAGNOSIS — M19072 Primary osteoarthritis, left ankle and foot: Secondary | ICD-10-CM

## 2021-01-06 DIAGNOSIS — Z96652 Presence of left artificial knee joint: Secondary | ICD-10-CM

## 2021-01-06 DIAGNOSIS — Z79899 Other long term (current) drug therapy: Secondary | ICD-10-CM

## 2021-01-06 DIAGNOSIS — M5136 Other intervertebral disc degeneration, lumbar region: Secondary | ICD-10-CM

## 2021-01-06 DIAGNOSIS — Z86010 Personal history of colonic polyps: Secondary | ICD-10-CM

## 2021-01-06 DIAGNOSIS — M47816 Spondylosis without myelopathy or radiculopathy, lumbar region: Secondary | ICD-10-CM

## 2021-01-06 DIAGNOSIS — Z8719 Personal history of other diseases of the digestive system: Secondary | ICD-10-CM

## 2021-01-06 DIAGNOSIS — M1711 Unilateral primary osteoarthritis, right knee: Secondary | ICD-10-CM

## 2021-01-06 DIAGNOSIS — E2740 Unspecified adrenocortical insufficiency: Secondary | ICD-10-CM

## 2021-01-06 DIAGNOSIS — M5030 Other cervical disc degeneration, unspecified cervical region: Secondary | ICD-10-CM

## 2021-01-06 MED ORDER — LEFLUNOMIDE 20 MG PO TABS: 20.0000 mg | ORAL_TABLET | Freq: Every day | ORAL | 0 refills | Status: DC

## 2021-01-06 NOTE — Patient Instructions (Signed)
Standing Labs We placed an order today for your standing lab work.   Please have your standing labs drawn in April and every 3 months   If possible, please have your labs drawn 2 weeks prior to your appointment so that the provider can discuss your results at your appointment.  We have open lab daily Monday through Thursday from 1:30-4:30 PM and Friday from 1:30-4:00 PM at the office of Dr. Shaili Deveshwar, [facility].   Please be advised, all patients with office appointments requiring lab work will take precedents over walk-in lab work.  If possible, please come for your lab work on Monday and Friday afternoons, as you may experience shorter wait times. The office is located at [address], [address], [place], [place] [address] No appointment is necessary.   Labs are drawn by [facility]. Please bring your co-pay at the time of your lab draw.  You may receive a bill from [facility] for your lab work.  If you wish to have your labs drawn at another location, please call the office 24 hours in advance to send orders.  If you have any questions regarding directions or hours of operation,  please call [phone].   As a reminder, please drink plenty of water prior to coming for your lab work. Thanks!   

## 2021-01-07 ENCOUNTER — Other Ambulatory Visit (HOSPITAL_COMMUNITY): Payer: Self-pay

## 2021-01-09 ENCOUNTER — Other Ambulatory Visit (HOSPITAL_COMMUNITY): Payer: Self-pay

## 2021-01-28 ENCOUNTER — Other Ambulatory Visit (HOSPITAL_COMMUNITY): Payer: Self-pay

## 2021-01-30 ENCOUNTER — Other Ambulatory Visit (HOSPITAL_COMMUNITY): Payer: Self-pay

## 2021-01-30 MED FILL — Abatacept Subcutaneous Soln Auto-Injector 125 MG/ML: SUBCUTANEOUS | 28 days supply | Qty: 4 | Fill #1 | Status: AC

## 2021-02-06 ENCOUNTER — Other Ambulatory Visit (HOSPITAL_COMMUNITY): Payer: Self-pay

## 2021-03-05 ENCOUNTER — Other Ambulatory Visit (HOSPITAL_COMMUNITY): Payer: Self-pay

## 2021-03-05 ENCOUNTER — Other Ambulatory Visit: Payer: Self-pay | Admitting: Physician Assistant

## 2021-04-08 ENCOUNTER — Other Ambulatory Visit (HOSPITAL_COMMUNITY): Payer: Self-pay

## 2021-04-10 ENCOUNTER — Other Ambulatory Visit (HOSPITAL_COMMUNITY): Payer: Self-pay

## 2021-05-22 ENCOUNTER — Other Ambulatory Visit (HOSPITAL_COMMUNITY): Payer: Self-pay

## 2021-05-22 ENCOUNTER — Telehealth: Payer: Self-pay | Admitting: Pharmacy Technician

## 2021-05-22 NOTE — Telephone Encounter (Signed)
Spoke with patient and he states that he has held Orencia for about 2.5 weeks. He states that he has had multiple MRI procedures and may be having another back surgery (TBD). States he has f/u on this decision for surgery and will likely have answer by f/u appt on 06/16/21.  Advised that pharmacy has not filled since May 2022. He said he had 4-5 pens remaining at home and was taking as prescribed  Chesley Mires, PharmD, MPH, BCPS Clinical Pharmacist (Rheumatology and Pulmonology)

## 2021-05-22 NOTE — Telephone Encounter (Signed)
Received notification from pharmacy that patient is being deactivated in their system. He has not filled his Orencia since 02/06/2021.

## 2021-06-02 NOTE — Progress Notes (Deleted)
Office Visit Note  Patient: Gregory Hinton             Date of Birth: [birth year]           MRN: [id number]             PCP: [facility] Referring: [facility] Visit Date: 06/16/2021 Occupation: @GUAROCC @  Subjective:  No chief complaint on file.   History of Present Illness: Gregory Hinton is a 55 y.o. male ***   Activities of Daily Living:  Patient reports morning stiffness for *** {minute/hour:19697}.   Patient {ACTIONS;DENIES/REPORTS:21021675::"Denies"} nocturnal pain.  Difficulty dressing/grooming: {ACTIONS;DENIES/REPORTS:21021675::"Denies"} Difficulty climbing stairs: {ACTIONS;DENIES/REPORTS:21021675::"Denies"} Difficulty getting out of chair: {ACTIONS;DENIES/REPORTS:21021675::"Denies"} Difficulty using hands for taps, buttons, cutlery, and/or writing: {ACTIONS;DENIES/REPORTS:21021675::"Denies"}  No Rheumatology ROS completed.   PMFS History:  Patient Active Problem List   Diagnosis Date Noted   Pneumonia due to COVID-19 virus 10/08/2020   High risk medication use 04/19/2018   History of diverticulitis 05/04/2017   History of colon polyps 05/04/2017   Primary osteoarthritis of both hands 05/04/2017   Primary osteoarthritis of both feet 05/04/2017   Primary osteoarthritis of both knees 04/02/2017   Class 1 obesity without serious comorbidity with body mass index (BMI) of 32.0 to 32.9 in adult 10/14/2016   Spondylosis of lumbar region without myelopathy or radiculopathy 10/14/2016   DJD (degenerative joint disease), cervical 10/14/2016   High risk medications (not anticoagulants) long-term use 10/14/2016   Rheumatoid arthritis, seronegative, multiple sites (HCC) 10/14/2016   Lumbar stenosis with neurogenic claudication 12/17/2015    Past Medical History:  Diagnosis Date   Adrenal insufficiency (HCC)    Anxiety    Arthritis    Back pain    had 3 fusions surgeries   H/O cardiovascular stress test 2014   admitted at  hosp. overnight for chest pain, stress/echo test wnl   Hypertension    PONV (postoperative nausea and vomiting)    Rheumatoid arthritis (HCC)    manages by Dr. 2015, rec'd Orencia weekly for 2 months, switched from Humira     Family History  Problem Relation Age of Onset   Aneurysm Mother    Leukemia Father    Healthy Daughter    Healthy Son    Past Surgical History:  Procedure Laterality Date   ANTERIOR LAT LUMBAR FUSION Left 12/26/2018   Procedure: Left Lumbar Two-Three Anterolateral decompression/interbody fusion/lateral plate;  Surgeon: 12/28/2018, MD;  Location: [facility] OR;  Service: Neurosurgery;  Laterality: Left;  Anterolateral   BACK SURGERY  [phone]   lumbar fusion 3 times   CARPAL TUNNEL RELEASE Right 2000   and left side 2 years ago with left ulnar surgery   COLONOSCOPY     HERNIA REPAIR Right 2001   inguinal    REPLACEMENT TOTAL KNEE Left 05/2020   Social History   Social History Narrative   Not on file   Immunization History  Administered Date(s) Administered   Influenza-Unspecified 05/05/2014   Pneumococcal Polysaccharide-23 04/25/2014   Zoster, Live 04/25/2014     Objective: Vital Signs: There were no vitals taken for this visit.   Physical Exam   Musculoskeletal Exam: ***  CDAI Exam: CDAI Score: -- Patient Global: --; Provider Global: -- Swollen: --; Tender: -- Joint Exam 06/16/2021   No joint exam has been documented for this visit   There is currently no information documented on the homunculus. Go to the Rheumatology activity and complete the homunculus joint exam.  Investigation: No  additional findings.  Imaging: No results found.  Recent Labs: Lab Results  Component Value Date   WBC 11.4 (H) 07/23/2020   HGB 15.1 07/23/2020   PLT 248 07/23/2020   NA 139 07/23/2020   K 3.8 07/23/2020   CL 104 07/23/2020   CO2 27 07/23/2020   GLUCOSE 93 07/23/2020   BUN 23 07/23/2020   CREATININE 0.87 07/23/2020   BILITOT 0.3 07/23/2020    ALKPHOS 28 (L) 11/24/2017   AST 11 07/23/2020   ALT 19 07/23/2020   PROT 5.9 (L) 07/23/2020   ALBUMIN 3.0 (L) 11/24/2017   CALCIUM 9.4 07/23/2020   GFRAA 113 07/23/2020   QFTBGOLDPLUS NEGATIVE 07/23/2020    Speciality Comments: Prior therapy: MTX, PLQ, Enbrel, Humira, and Orencia (inadequate response). Due to his history of diverticulitis, Harriette Ohara is not an option. Simponi-rash and fatigue  Procedures:  No procedures performed Allergies: Eggs or egg-derived products   Assessment / Plan:     Visit Diagnoses: No diagnosis found.  Orders: No orders of the defined types were placed in this encounter.  No orders of the defined types were placed in this encounter.   Face-to-face time spent with patient was *** minutes. Greater than 50% of time was spent in counseling and coordination of care.  Follow-Up Instructions: No follow-ups on file.   Ellen Henri, CMA  Note - This record has been created using Animal nutritionist.  Chart creation errors have been sought, but may not always  have been located. Such creation errors do not reflect on  the standard of medical care.

## 2021-06-10 ENCOUNTER — Other Ambulatory Visit: Payer: Self-pay | Admitting: Neurological Surgery

## 2021-06-10 DIAGNOSIS — M48062 Spinal stenosis, lumbar region with neurogenic claudication: Secondary | ICD-10-CM

## 2021-06-16 ENCOUNTER — Ambulatory Visit: Payer: BC Managed Care – PPO | Admitting: Rheumatology

## 2021-06-16 DIAGNOSIS — M1711 Unilateral primary osteoarthritis, right knee: Secondary | ICD-10-CM

## 2021-06-16 DIAGNOSIS — Z8719 Personal history of other diseases of the digestive system: Secondary | ICD-10-CM

## 2021-06-16 DIAGNOSIS — M0609 Rheumatoid arthritis without rheumatoid factor, multiple sites: Secondary | ICD-10-CM

## 2021-06-16 DIAGNOSIS — Z79899 Other long term (current) drug therapy: Secondary | ICD-10-CM

## 2021-06-16 DIAGNOSIS — Z8616 Personal history of COVID-19: Secondary | ICD-10-CM

## 2021-06-16 DIAGNOSIS — M5136 Other intervertebral disc degeneration, lumbar region: Secondary | ICD-10-CM

## 2021-06-16 DIAGNOSIS — M66331 Spontaneous rupture of flexor tendons, right forearm: Secondary | ICD-10-CM

## 2021-06-16 DIAGNOSIS — M19041 Primary osteoarthritis, right hand: Secondary | ICD-10-CM

## 2021-06-16 DIAGNOSIS — Z86010 Personal history of colonic polyps: Secondary | ICD-10-CM

## 2021-06-16 DIAGNOSIS — M47816 Spondylosis without myelopathy or radiculopathy, lumbar region: Secondary | ICD-10-CM

## 2021-06-16 DIAGNOSIS — E2740 Unspecified adrenocortical insufficiency: Secondary | ICD-10-CM

## 2021-06-16 DIAGNOSIS — Z96652 Presence of left artificial knee joint: Secondary | ICD-10-CM

## 2021-06-16 DIAGNOSIS — M5030 Other cervical disc degeneration, unspecified cervical region: Secondary | ICD-10-CM

## 2021-06-16 DIAGNOSIS — M19072 Primary osteoarthritis, left ankle and foot: Secondary | ICD-10-CM

## 2021-06-30 ENCOUNTER — Ambulatory Visit
Admission: RE | Admit: 2021-06-30 | Discharge: 2021-06-30 | Disposition: A | Discharge status: Home / Self Care | Payer: BC Managed Care – PPO | Source: Ambulatory Visit | Attending: Neurological Surgery | Admitting: Neurological Surgery

## 2021-06-30 ENCOUNTER — Other Ambulatory Visit: Payer: Self-pay

## 2021-06-30 DIAGNOSIS — M48062 Spinal stenosis, lumbar region with neurogenic claudication: Secondary | ICD-10-CM

## 2021-06-30 IMAGING — MR MR LUMBAR SPINE WO/W CM
5 of 12 series · 17 of 48 positions shown · IV contrast (multihance)
Comparison: [DATE]

CLINICAL DATA: Spinal stenosis of lumbar region with neurogenic
claudication. Low back pain.

EXAM:
MRI LUMBAR SPINE WITHOUT AND WITH CONTRAST
TECHNIQUE: Multiplanar and multiecho pulse sequences of the lumbar spine were
obtained without and with intravenous contrast.
CONTRAST:  20mL MULTIHANCE GADOBENATE DIMEGLUMINE 529 MG/ML IV SOLN

[Series 5: T1 · sagittal · 4.0mm · 0.73mm/px · 1 of 15 slices shown]
[im 1/15]
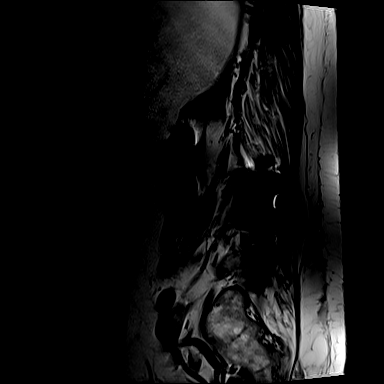

[Series 13: T2 · axial · 4.0mm · 0.35mm/px · z∈[-80,+10]mm · 3 of 19 slices shown (1 of 4)]
[im 1/19]
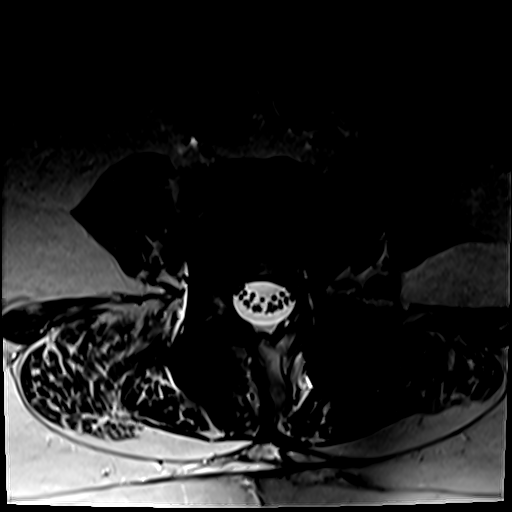
[im 10/19]
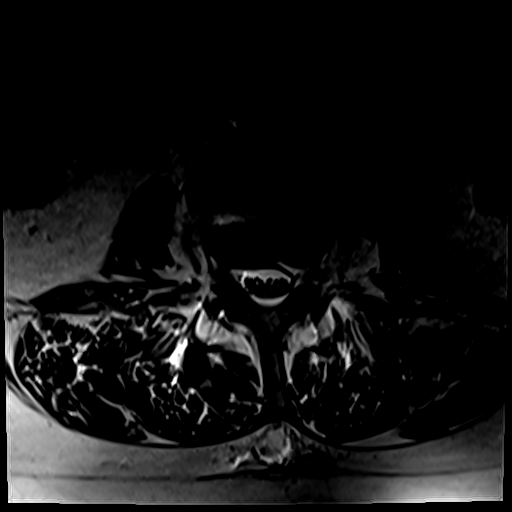
[im 19/19]
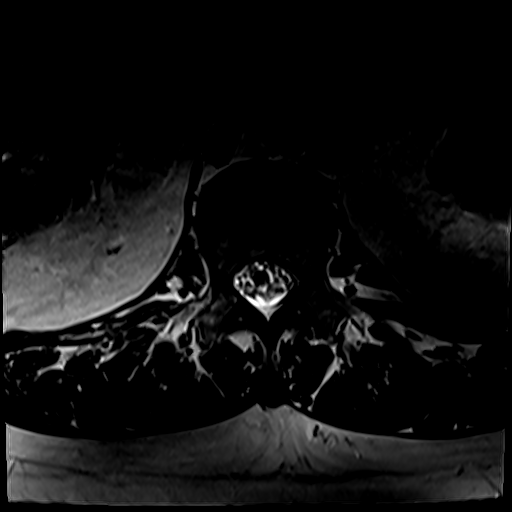

[Series 14: T2 · axial · 4.0mm · 0.28mm/px · z∈[-194,-100]mm · 3 of 20 slices shown (2 of 4)]
[im 1/20]
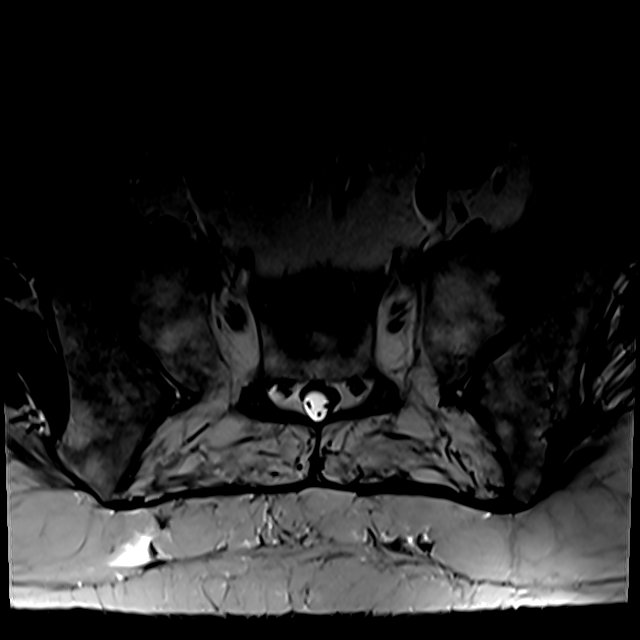
[im 10/20]
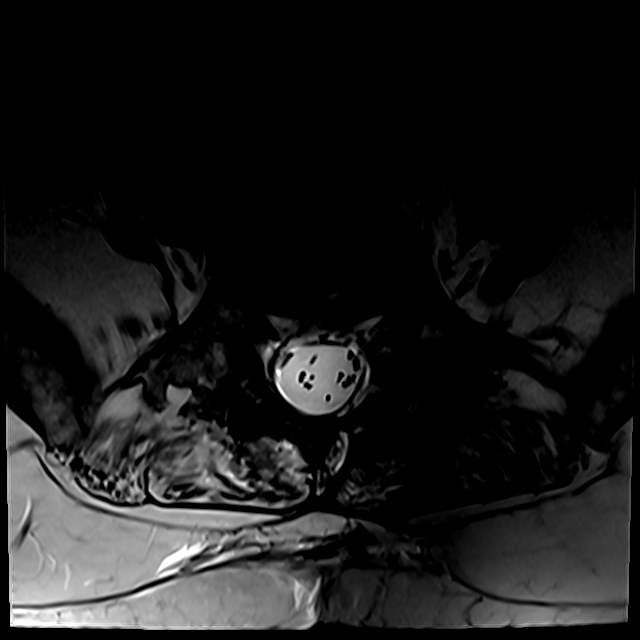
[im 20/20]
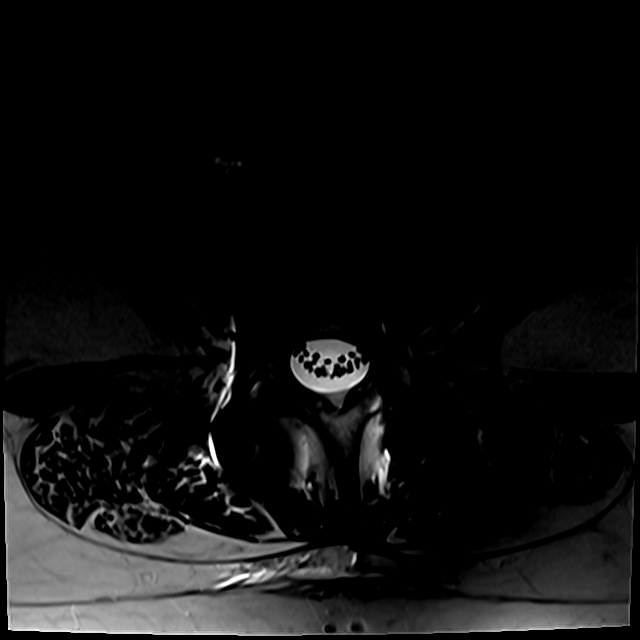

[Series 15: T2 · axial · 4.0mm · 0.28mm/px · z∈[-194,+10]mm · 7 of 39 slices shown (3 of 4)]
[im 1/39]
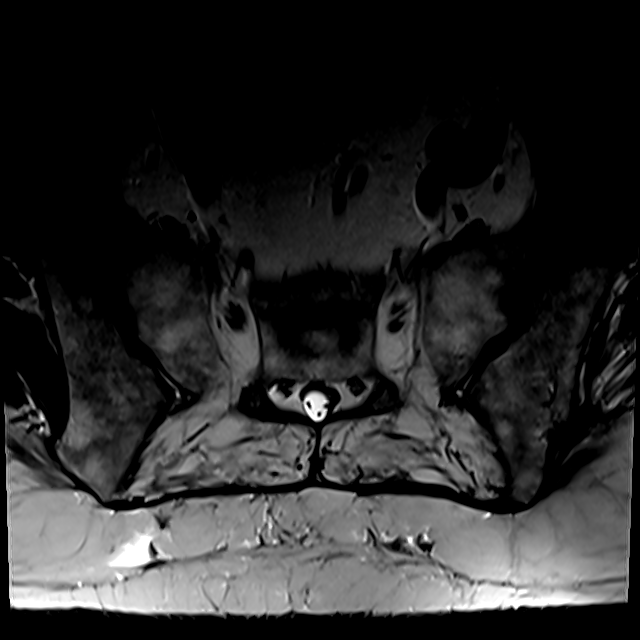
[im 7/39]
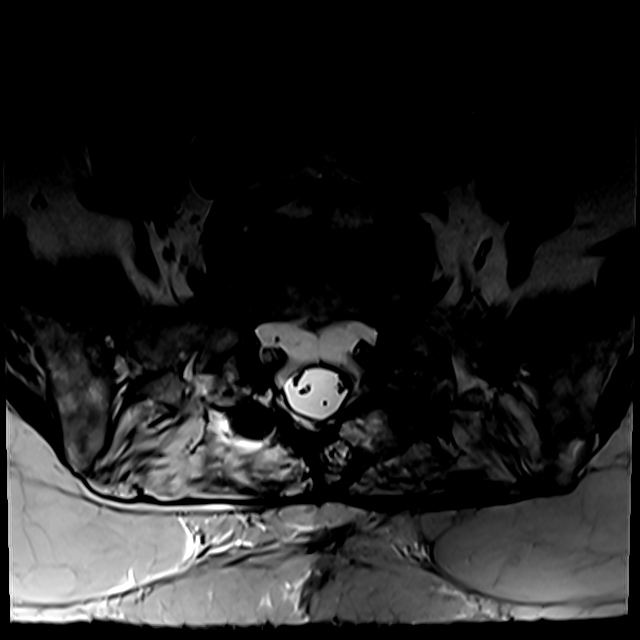
[im 13/39]
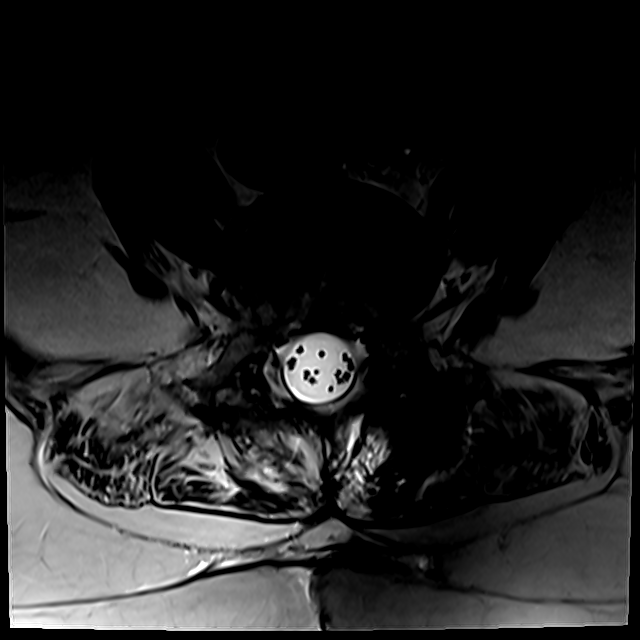
[im 20/39]
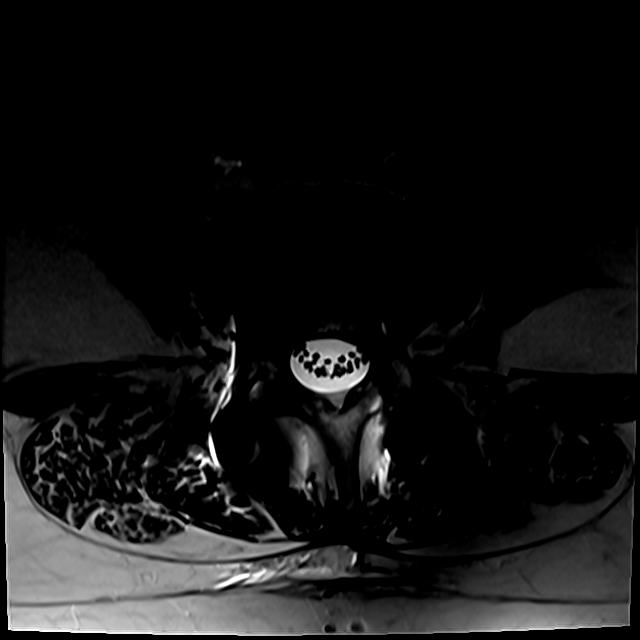
[im 26/39]
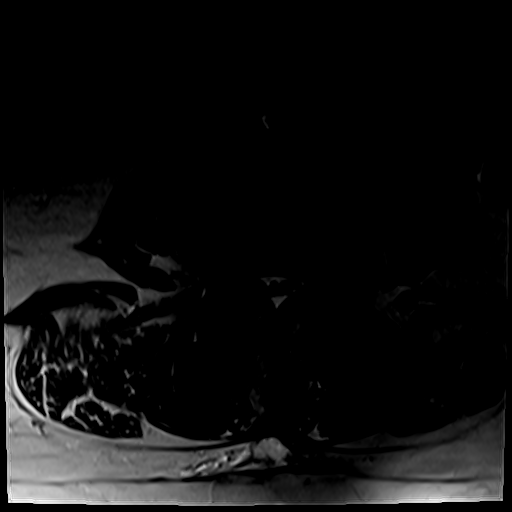
[im 32/39]
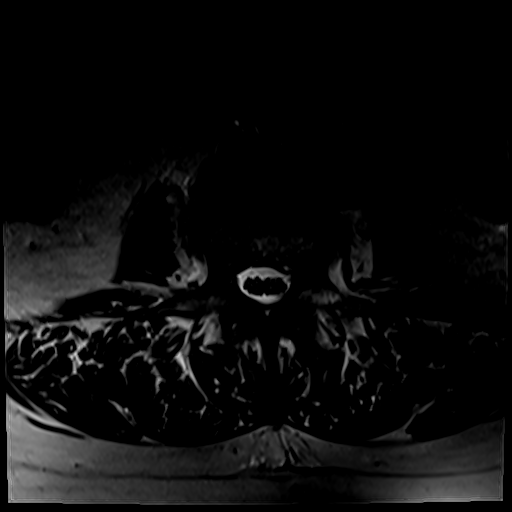
[im 39/39]
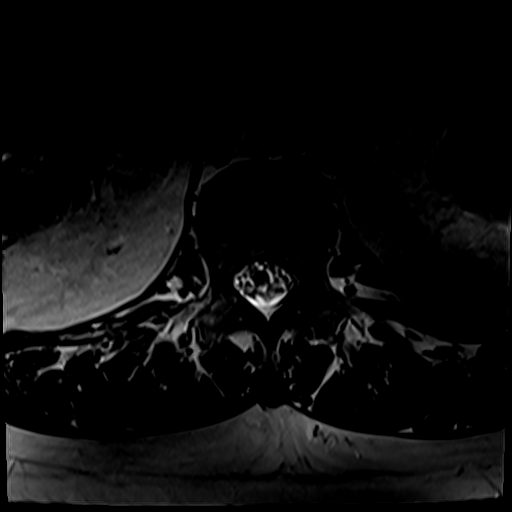

[Series 16: T2 · sagittal · 4.0mm · 0.73mm/px · 3 of 15 slices shown (4 of 4)]
[im 1/15]
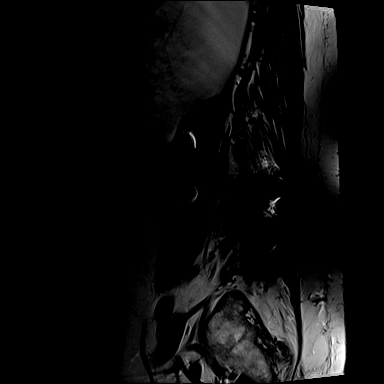
[im 8/15]
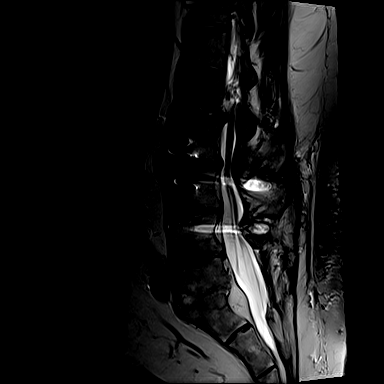
[im 15/15]
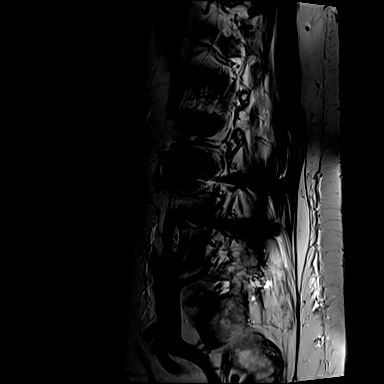

[17 of 48 positions shown; findings below may reference images not displayed]

FINDINGS: Segmentation:  Standard.

Alignment: Chronic straightening of the normal lumbar lordosis and
minimal retrolisthesis of L5 on S1.

Vertebrae: No fracture or suspicious marrow lesion. New prominent
degenerative endplate changes at L1-2 including left-sided
degenerative edema. Remote L3-S1 posterior and interbody fusion with
previous removal of pedicle screws at L5 and S1. Solid interbody
osseous fusion at L4-5 and L5-S1. Interval anterolateral interbody
fusion at L2-3.

Conus medullaris and cauda equina: Conus extends to the L1 level.
Conus and cauda equina appear normal.

Paraspinal and other soft tissues: Postoperative changes in the
posterior lumbar soft tissues.

Disc levels:

T12-L1: Negative.

L1-2: New prominent circumferential disc bulging, mild facet and
ligamentum flavum hypertrophy, and epidural lipomatosis result in
new severe spinal stenosis and moderate right greater than left
neural foraminal stenosis.

L2-3: Interval fusion. Disc bulging, severe facet and ligamentum
flavum hypertrophy, and epidural lipomatosis result in moderate
residual spinal stenosis and minimal bilateral neural foraminal
narrowing.

L3-4: Prior posterior decompression and fusion. No significant
stenosis.

L4-5: Prior posterior decompression and fusion. No significant
stenosis.

L5-S1: Prior posterior decompression and fusion. No significant
stenosis.
IMPRESSION: 1. Interval L2-3 fusion with moderate residual spinal stenosis.
2. New adjacent segment disease at L1-2 with severe spinal stenosis
and moderate neural foraminal stenosis.
3. Remote L3-S1 fusion without significant stenosis.

## 2021-06-30 MED ORDER — GADOBENATE DIMEGLUMINE 529 MG/ML IV SOLN
20.0000 mL | Freq: Once | INTRAVENOUS | Status: AC | PRN
  Administered 2021-06-30: 20 mL via INTRAVENOUS

## 2021-07-04 ENCOUNTER — Other Ambulatory Visit: Payer: Self-pay | Admitting: Neurological Surgery

## 2021-08-13 NOTE — Pre-Procedure Instructions (Signed)
Surgical Instructions    Your procedure is scheduled on Monday, August 18, 2021 at 7:30 AM.  Report to [facility] Main Entrance "A" at 5:30 A.M., then check in with the Admitting office.  Call this number if you have problems the morning of surgery:  [phone]   If you have any questions prior to your surgery date call [phone]: Open Monday-Friday 8am-4pm    Remember:  Do not eat after midnight the night before your surgery  You may drink clear liquids until 4:30 AM the morning of your surgery.   Clear liquids allowed are: Water, Non-Citrus Juices (without pulp), Carbonated Beverages, Clear Tea, Black Coffee Only, and Gatorade    Take these medicines the morning of surgery with A SIP OF WATER:  atenolol (TENORMIN)  buPROPion (WELLBUTRIN XL) leflunomide (ARAVA) predniSONE (DELTASONE)  IF NEEDED: albuterol (VENTOLIN HFA) - Bring with you the morning of surgery.  As of today, STOP taking any Aspirin (unless otherwise instructed by your surgeon) Aleve, Naproxen, ibuprofen (ADVIL,MOTRIN), Goody's, BC's, all herbal medications, fish oil, and all vitamins.                     Do NOT Smoke (Tobacco/Vaping) or drink Alcohol 24 hours prior to your procedure.  If you use a CPAP at night, you may bring all equipment for your overnight stay.   Contacts, glasses, piercing's, hearing aid's, dentures or partials may not be worn into surgery, please bring cases for these belongings.    For patients admitted to the hospital, discharge time will be determined by your treatment team.   Patients discharged the day of surgery will not be allowed to drive home, and someone needs to stay with them for 24 hours.  NO VISITORS WILL BE ALLOWED IN PRE-OP WHERE PATIENTS GET READY FOR SURGERY.  ONLY 1 SUPPORT PERSON MAY BE PRESENT IN THE WAITING ROOM WHILE YOU ARE IN SURGERY.  IF YOU ARE TO BE ADMITTED, ONCE YOU ARE IN YOUR ROOM YOU WILL BE ALLOWED TWO (2) VISITORS.  Minor children may have two  parents present. Special consideration for safety and communication needs will be reviewed on a case by case basis.   Special instructions:   [facility]- Preparing For Surgery  Before surgery, you can play an important role. Because skin is not sterile, your skin needs to be as free of germs as possible. You can reduce the number of germs on your skin by washing with CHG (chlorahexidine gluconate) Soap before surgery.  CHG is an antiseptic cleaner which kills germs and bonds with the skin to continue killing germs even after washing.    Oral Hygiene is also important to reduce your risk of infection.  Remember - BRUSH YOUR TEETH THE MORNING OF SURGERY WITH YOUR REGULAR TOOTHPASTE  Please do not use if you have an allergy to CHG or antibacterial soaps. If your skin becomes reddened/irritated stop using the CHG.  Do not shave (including legs and underarms) for at least 48 hours prior to first CHG shower. It is OK to shave your face.  Please follow these instructions carefully.   Shower the NIGHT BEFORE SURGERY and the MORNING OF SURGERY  If you chose to wash your hair, wash your hair first as usual with your normal shampoo.  After you shampoo, rinse your hair and body thoroughly to remove the shampoo.  Use CHG Soap as you would any other liquid soap. You can apply CHG directly to the skin and wash gently with  a scrungie or a clean washcloth.   Apply the CHG Soap to your body ONLY FROM THE NECK DOWN.  Do not use on open wounds or open sores. Avoid contact with your eyes, ears, mouth and genitals (private parts). Wash Face and genitals (private parts)  with your normal soap.   Wash thoroughly, paying special attention to the area where your surgery will be performed.  Thoroughly rinse your body with warm water from the neck down.  DO NOT shower/wash with your normal soap after using and rinsing off the CHG Soap.  Pat yourself dry with a CLEAN TOWEL.  Wear CLEAN PAJAMAS to bed the night  before surgery  Place CLEAN SHEETS on your bed the night before your surgery  DO NOT SLEEP WITH PETS.   Day of Surgery: Shower with CHG soap. Do not wear jewelry. Do not wear lotions, powders, colognes, or deodorant. Do not shave 48 hours prior to surgery.  Men may shave face and neck. Do not bring valuables to the hospital. [facility] is not responsible for any belongings or valuables. Wear Clean/Comfortable clothing the morning of surgery Remember to brush your teeth WITH YOUR REGULAR TOOTHPASTE.   Please read over the following fact sheets that you were given.   3 days prior to your procedure or After your COVID test   You are not required to quarantine however you are required to wear a well-fitting mask when you are out and around people not in your household. If your mask becomes wet or soiled, replace with a new one.   Wash your hands often with soap and water for 20 seconds or clean your hands with an alcohol-based hand sanitizer that contains at least 60% alcohol.   Do not share personal items.   Notify your provider:  o if you are in close contact with someone who has COVID  o or if you develop a fever of 100.4 or greater, sneezing, cough, sore throat, shortness of breath or body aches.

## 2021-08-14 ENCOUNTER — Encounter (HOSPITAL_COMMUNITY): Payer: Self-pay

## 2021-08-14 ENCOUNTER — Other Ambulatory Visit: Payer: Self-pay

## 2021-08-14 ENCOUNTER — Encounter (HOSPITAL_COMMUNITY)
Admission: RE | Admit: 2021-08-14 | Discharge: 2021-08-14 | Disposition: A | Discharge status: Home / Self Care | Payer: BC Managed Care – PPO | Source: Ambulatory Visit | Attending: Neurological Surgery | Admitting: Neurological Surgery

## 2021-08-14 VITALS — BP 139/84 | HR 92 | Temp 98.1°F | Resp 18 | Ht 76.0 in | Wt 295.4 lb

## 2021-08-14 DIAGNOSIS — Z01812 Encounter for preprocedural laboratory examination: Secondary | ICD-10-CM | POA: Insufficient documentation

## 2021-08-14 DIAGNOSIS — Z20822 Contact with and (suspected) exposure to covid-19: Secondary | ICD-10-CM | POA: Diagnosis not present

## 2021-08-14 DIAGNOSIS — Z01818 Encounter for other preprocedural examination: Secondary | ICD-10-CM

## 2021-08-14 HISTORY — DX: Anemia, unspecified: D64.9

## 2021-08-14 LAB — SURGICAL PCR SCREEN
MRSA, PCR: NEGATIVE
Staphylococcus aureus: NEGATIVE

## 2021-08-14 LAB — TYPE AND SCREEN
ABO/RH(D): A POS
Antibody Screen: NEGATIVE

## 2021-08-14 LAB — SARS CORONAVIRUS 2 (TAT 6-24 HRS): SARS Coronavirus 2: NEGATIVE

## 2021-08-14 NOTE — Progress Notes (Signed)
PCP - Dr. Sterling Big ([facility]) Cardiologist - Dr. Kevin Fenton  Seen once last year, no need to follow up with cardiology unless further issues, per patient  Pulmonologist - Dr. Vassie Loll Endocrinologist - Dr. Allena Katz  Chest x-ray - 07/23/21 - CE (performed by PCP) EKG - 07/23/21 - CE (performed by PCP) Stress Test - 04/30/20 Cardiac Cath - 03/26/17   ERAS Protcol - yes, no drink ordered or given   COVID TEST- 08/14/21 (surgery admit)   Anesthesia review: yes, heart history Records (last office note, EKG tracing) requested from PCP Per patient, PCP aware that he is having surgery  Patient denies shortness of breath, fever, cough and chest pain at PAT appointment   All instructions explained to the patient, with a verbal understanding of the material. Patient agrees to go over the instructions while at home for a better understanding. Patient also instructed to self quarantine after being tested for COVID-19. The opportunity to ask questions was provided.

## 2021-08-15 NOTE — Progress Notes (Signed)
Anesthesia Chart Review:  Patient has had multiple cardiology evaluations for atypical chest pain and palpitations.  He had a normal cath July 2018.  He was most recently seen by cardiology at [facility] in July 2021 for preop clearance prior to knee surgery.  At that time he had event monitor, echo, and stress test which were all benign.  Results of nuclear stress are not available in Care Everywhere however, Dr. Kingsley Callander commented on result 05/02/2020 stating, "The nuclear stress test showed gut uptake which created artifact. However the summed stress score was 0 which is normal and indicates that this is a low risk test. I do not think this represents significant ischemia and it is not c/w a large area of jeopardized myocardium. In addition LV function on echo is normal."  Patient cleared to undergo surgery at that time and he reports he was advised to follow-up with cardiology on an as-needed basis.  Patient continues to have some palpitations and lower extremity edema which are managed by PCP Dr. Sterling Big.  He is on atenolol and Lasix for this.  He was cleared for surgery per note 07/30/2021 stating, "At this time we will continue atenolol 25 mg twice daily. Okay to continue Lasix 40 in the morning 20 in the afternoon and with daily potassium. We will report any changes. Lab work reviewed. At this time he is medically cleared for surgical intervention."  History of adrenal insufficiency followed by endocrinology.  He is maintained on prednisone 10 mg daily. Will likely need stress dose.   Rheumatoid arthritis followed by rheumatology.  He is maintained on Abatacept and leflunomide.  CBC and CMP from 07/30/2021 in Care Everywhere reviewed, unremarkable.  EKG 07/23/2021 (care everywhere): Sinus tachycardia. Rate 110.  Inferior infarct. No change from prior.   TTE 05/01/2020 (Care Everywhere): SUMMARY  The left ventricular size is normal.  Left ventricular systolic function is normal.  LV  ejection fraction = 60-65%.  The left ventricular wall motion is normal.  There is mild mitral regurgitation.  Moderate left ventricular hypertrophy    Event monitor 05/01/2020 (Care Everywhere): CONCLUSIONS:  1.  normal Holter monitor.  2.  Arrhythmia present as described  3.  Symptoms were not reported  4.  Symptoms were not correlated with arrhythmias   Cath 03/26/2017 (Care Everywhere): 1. Normal coronary arteriogram. 2. Left-dominant circulation. 3. Normal LV systolic function, EF 55-60% Diagnostic Procedure Recommendations 1. No cardiac explanation for this patient's symptoms was found. 2. Continue outpatient work up for non-cardiac explanations for chest pain and exertional dyspnea.  Angiographic findings Cardiac Arteries and Lesion Findings LMCA: Normal appearance with 0% stenosis. LAD: Normal appearance with 0% stenosis. LCx: Normal appearance with 0% stenosis. RCA: Normal appearance with 0% stenosis.  LV function normal, EF 55%. LVEDP 14-16.    Zannie Cove [facility]/Anesthesiology Phone [phone] 08/15/2021 10:31 AM

## 2021-08-15 NOTE — Anesthesia Preprocedure Evaluation (Addendum)
Anesthesia Evaluation  Patient identified by MRN, date of birth, ID band Patient awake    Reviewed: Allergy & Precautions, NPO status , Patient's Chart, lab work & pertinent test results, reviewed documented beta blocker date and time   History of Anesthesia Complications (+) PONV and history of anesthetic complications  Airway Mallampati: III  TM Distance: >3 FB Neck ROM: Full    Dental  (+) Dental Advisory Given, Teeth Intact, Missing   Pulmonary pneumonia, former smoker,    Pulmonary exam normal breath sounds clear to auscultation       Cardiovascular hypertension, Pt. on home beta blockers and Pt. on medications  Rhythm:Regular Rate:Normal     Neuro/Psych PSYCHIATRIC DISORDERS Anxiety negative neurological ROS     GI/Hepatic negative GI ROS, Neg liver ROS,   Endo/Other    Renal/GU negative Renal ROS     Musculoskeletal  (+) Arthritis ,   Abdominal (+) + obese,   Peds  Hematology  (+) Blood dyscrasia, anemia ,   Anesthesia Other Findings   Reproductive/Obstetrics                                                            Anesthesia Evaluation  Patient identified by MRN, date of birth, ID band Patient awake    Reviewed: Allergy & Precautions, H&P , NPO status , Patient's Chart, lab work & pertinent test results  History of Anesthesia Complications (+) PONV and history of anesthetic complications  Airway Mallampati: II   Neck ROM: full    Dental   Pulmonary former smoker,    breath sounds clear to auscultation       Cardiovascular hypertension,  Rhythm:regular Rate:Normal     Neuro/Psych PSYCHIATRIC DISORDERS Anxiety    GI/Hepatic   Endo/Other  Adrenal insufficiency, on prednisone  Renal/GU      Musculoskeletal  (+) Arthritis ,   Abdominal   Peds  Hematology   Anesthesia Other Findings   Reproductive/Obstetrics                              Anesthesia Physical Anesthesia Plan  ASA: II  Anesthesia Plan: General   Post-op Pain Management:    Induction: Intravenous  PONV Risk Score and Plan: 3 and Dexamethasone, Ondansetron, Midazolam and Treatment may vary due to age or medical condition  Airway Management Planned: Oral ETT  Additional Equipment:   Intra-op Plan:   Post-operative Plan: Extubation in OR  Informed Consent: I have reviewed the patients History and Physical, chart, labs and discussed the procedure including the risks, benefits and alternatives for the proposed anesthesia with the patient or authorized representative who has indicated his/her understanding and acceptance.       Plan Discussed with: CRNA, Anesthesiologist and Surgeon  Anesthesia Plan Comments:         Anesthesia Quick Evaluation  Anesthesia Physical Anesthesia Plan  ASA: 3  Anesthesia Plan: General   Post-op Pain Management:    Induction: Intravenous  PONV Risk Score and Plan: 4 or greater and Ondansetron, Treatment may vary due to age or medical condition, Dexamethasone, Diphenhydramine and Midazolam  Airway Management Planned: Oral ETT and Video Laryngoscope Planned  Additional Equipment:   Intra-op Plan:   Post-operative Plan: Extubation in OR  Informed  Consent: I have reviewed the patients History and Physical, chart, labs and discussed the procedure including the risks, benefits and alternatives for the proposed anesthesia with the patient or authorized representative who has indicated his/her understanding and acceptance.     Dental advisory given  Plan Discussed with: CRNA  Anesthesia Plan Comments: (PAT note by Antionette Poles, PA-C: Patient has had multiple cardiology evaluations for atypical chest pain and palpitations.  He had a normal cath July 2018.  He was most recently seen by cardiology at [facility] in July 2021 for preop clearance prior to knee surgery.  At that time he had  event monitor, echo, and stress test which were all benign.  Results of nuclear stress are not available in Care Everywhere however, Dr. Kingsley Callander commented on result 05/02/2020 stating, "The nuclear stress test showed gut uptake which created artifact. However the summed stress score was 0 which is normal and indicates that this is a low risk test. I do not think this represents significant ischemia and it is not c/w a large area of jeopardized myocardium. In addition LV function on echo is normal."  Patient cleared to undergo surgery at that time and he reports he was advised to follow-up with cardiology on an as-needed basis.  Patient continues to have some palpitations and lower extremity edema which are managed by PCP Dr. Sterling Big.  He is on atenolol and Lasix for this.  He was cleared for surgery per note 07/30/2021 stating, "At this time we will continue atenolol 25 mg twice daily. Okay to continue Lasix 40 in the morning 20 in the afternoon and with daily potassium. We will report any changes. Lab work reviewed. At this time he is medically cleared for surgical intervention."  History of adrenal insufficiency followed by endocrinology.  He is maintained on prednisone 10 mg daily. Will likely need stress dose.   Rheumatoid arthritis followed by rheumatology.  He is maintained on Abatacept and leflunomide.  CBC and CMP from 07/30/2021 in Care Everywhere reviewed, unremarkable.  EKG 07/23/2021 (care everywhere): Sinus tachycardia. Rate 110.  Inferior infarct. No change from prior.   TTE 05/01/2020 (Care Everywhere): SUMMARY  The left ventricular size is normal.  Left ventricular systolic function is normal.  LV ejection fraction = 60-65%.  The left ventricular wall motion is normal.  There is mild mitral regurgitation.  Moderate left ventricular hypertrophy   Event monitor 05/01/2020 (Care Everywhere): CONCLUSIONS:  1. normal Holter monitor.  2. Arrhythmia present as described   3. Symptoms were not reported  4. Symptoms were not correlated with arrhythmias   Cath 03/26/2017 (Care Everywhere): 1. Normal coronary arteriogram. 2. Left-dominant circulation. 3. Normal LV systolic function, EF 55-60% Diagnostic Procedure Recommendations 1. No cardiac explanation for this patient's symptoms was found. 2. Continue outpatient work up for non-cardiac explanations for chest pain and exertional dyspnea.  Angiographic findings Cardiac Arteries and Lesion Findings LMCA: Normal appearance with 0% stenosis. LAD: Normal appearance with 0% stenosis. LCx: Normal appearance with 0% stenosis. RCA: Normal appearance with 0% stenosis.  LV function normal, EF 55%. LVEDP 14-16.  )      Anesthesia Quick Evaluation

## 2021-08-18 ENCOUNTER — Inpatient Hospital Stay (HOSPITAL_COMMUNITY): Payer: BC Managed Care – PPO

## 2021-08-18 ENCOUNTER — Encounter (HOSPITAL_COMMUNITY): Payer: Self-pay | Admitting: Neurological Surgery

## 2021-08-18 ENCOUNTER — Inpatient Hospital Stay (HOSPITAL_COMMUNITY): Payer: BC Managed Care – PPO | Admitting: Certified Registered"

## 2021-08-18 ENCOUNTER — Inpatient Hospital Stay (HOSPITAL_COMMUNITY)
Admission: RE | Admit: 2021-08-18 | Discharge: 2021-08-19 | DRG: 454 | Disposition: A | Discharge status: Home / Self Care | Payer: BC Managed Care – PPO | Attending: Neurological Surgery | Admitting: Neurological Surgery

## 2021-08-18 ENCOUNTER — Encounter (HOSPITAL_COMMUNITY)
Admission: RE | Disposition: A | Discharge status: Home / Self Care | Payer: Self-pay | Source: Home / Self Care | Attending: Neurological Surgery

## 2021-08-18 ENCOUNTER — Inpatient Hospital Stay (HOSPITAL_COMMUNITY): Payer: BC Managed Care – PPO | Admitting: Physician Assistant

## 2021-08-18 DIAGNOSIS — M069 Rheumatoid arthritis, unspecified: Secondary | ICD-10-CM | POA: Diagnosis present

## 2021-08-18 DIAGNOSIS — Y838 Other surgical procedures as the cause of abnormal reaction of the patient, or of later complication, without mention of misadventure at the time of the procedure: Secondary | ICD-10-CM | POA: Diagnosis present

## 2021-08-18 DIAGNOSIS — Z6834 Body mass index (BMI) 34.0-34.9, adult: Secondary | ICD-10-CM | POA: Diagnosis not present

## 2021-08-18 DIAGNOSIS — M960 Pseudarthrosis after fusion or arthrodesis: Secondary | ICD-10-CM | POA: Diagnosis present

## 2021-08-18 DIAGNOSIS — E2740 Unspecified adrenocortical insufficiency: Secondary | ICD-10-CM | POA: Diagnosis present

## 2021-08-18 DIAGNOSIS — Z7952 Long term (current) use of systemic steroids: Secondary | ICD-10-CM

## 2021-08-18 DIAGNOSIS — M4316 Spondylolisthesis, lumbar region: Secondary | ICD-10-CM | POA: Diagnosis present

## 2021-08-18 DIAGNOSIS — Z87891 Personal history of nicotine dependence: Secondary | ICD-10-CM | POA: Diagnosis not present

## 2021-08-18 DIAGNOSIS — Z79899 Other long term (current) drug therapy: Secondary | ICD-10-CM

## 2021-08-18 DIAGNOSIS — M48062 Spinal stenosis, lumbar region with neurogenic claudication: Principal | ICD-10-CM | POA: Diagnosis present

## 2021-08-18 DIAGNOSIS — Z419 Encounter for procedure for purposes other than remedying health state, unspecified: Secondary | ICD-10-CM

## 2021-08-18 DIAGNOSIS — M5126 Other intervertebral disc displacement, lumbar region: Secondary | ICD-10-CM | POA: Diagnosis present

## 2021-08-18 DIAGNOSIS — Z806 Family history of leukemia: Secondary | ICD-10-CM | POA: Diagnosis not present

## 2021-08-18 DIAGNOSIS — E669 Obesity, unspecified: Secondary | ICD-10-CM | POA: Diagnosis present

## 2021-08-18 DIAGNOSIS — Z20822 Contact with and (suspected) exposure to covid-19: Secondary | ICD-10-CM | POA: Diagnosis present

## 2021-08-18 DIAGNOSIS — I10 Essential (primary) hypertension: Secondary | ICD-10-CM | POA: Diagnosis present

## 2021-08-18 DIAGNOSIS — F419 Anxiety disorder, unspecified: Secondary | ICD-10-CM | POA: Diagnosis present

## 2021-08-18 DIAGNOSIS — M4726 Other spondylosis with radiculopathy, lumbar region: Secondary | ICD-10-CM | POA: Diagnosis present

## 2021-08-18 IMAGING — CR DG LUMBAR SPINE 1V
1 series · 1 of 1 positions shown · non-contrast
Comparison: [DATE]

CLINICAL DATA: Localization.

EXAM:
LUMBAR SPINE - 1 VIEW

[lateral]
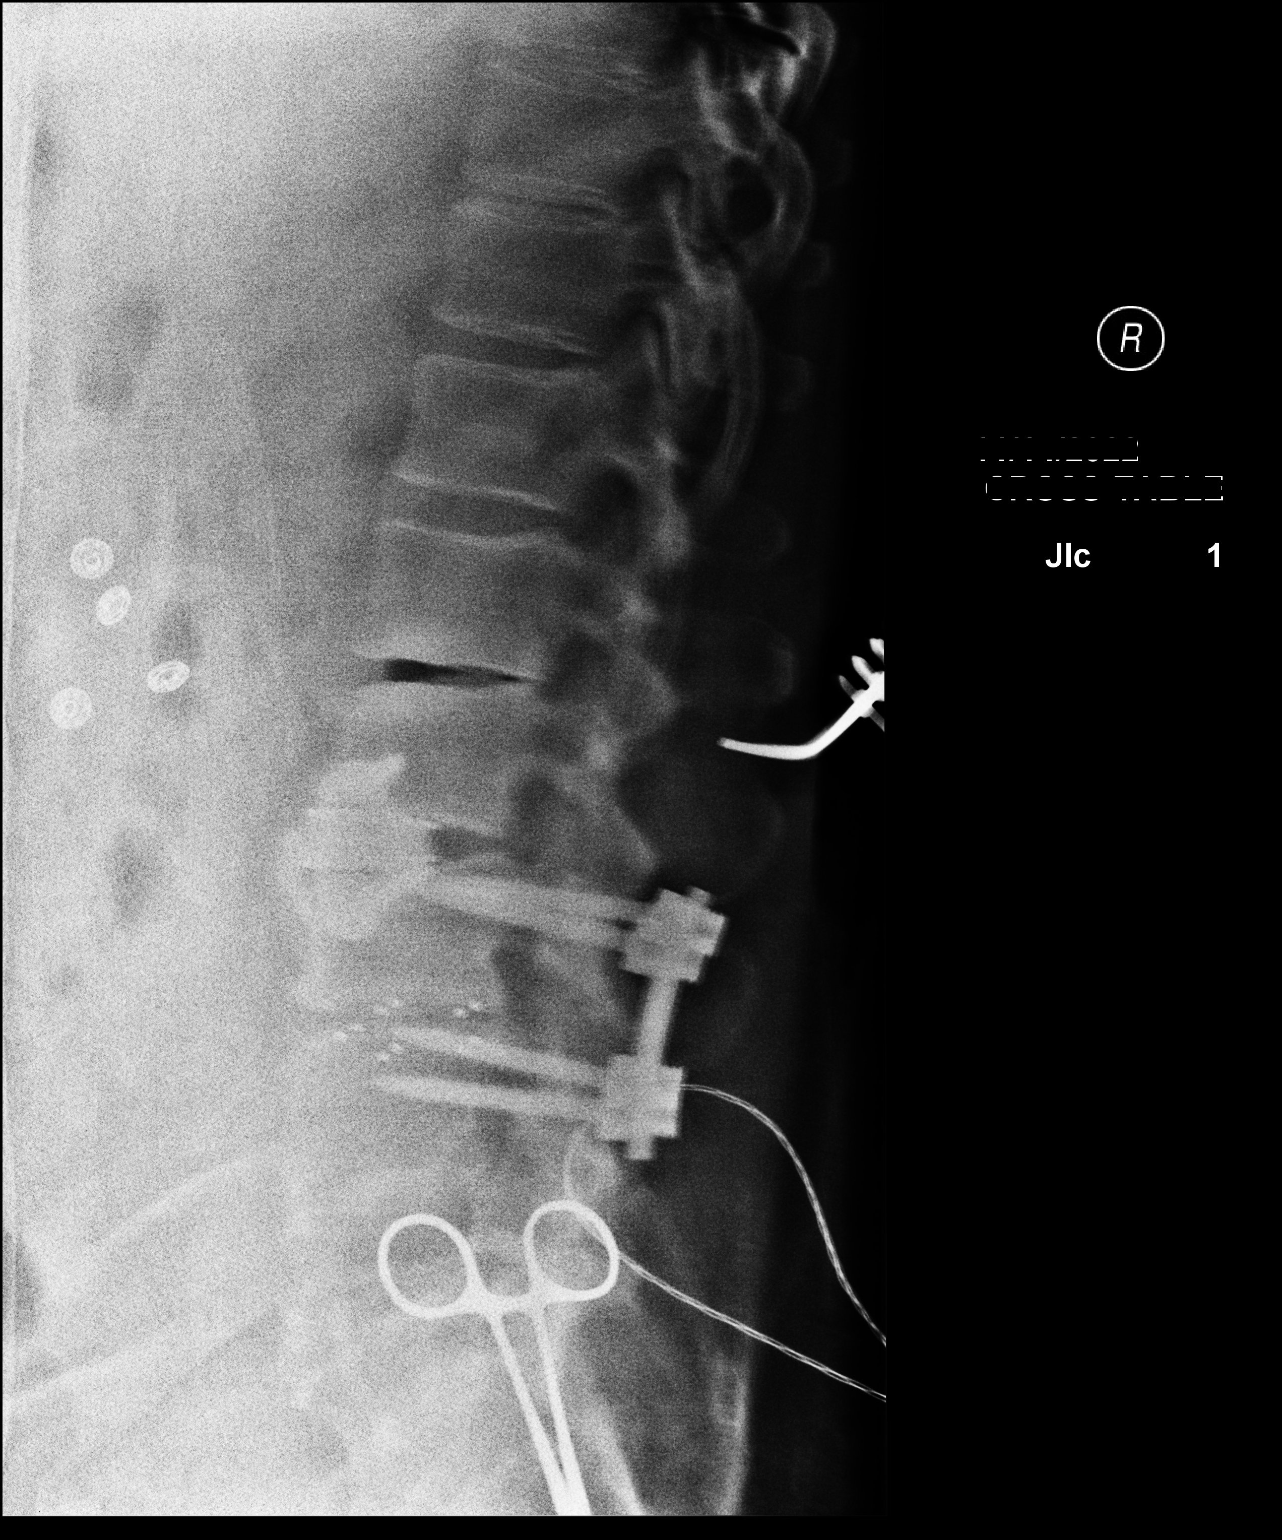

[1 of 1 positions shown; findings below may reference images not displayed]

FINDINGS: Patient has had remote anterior fusion of L2-3 and interbody fusion
of L3-4. Remote posterior fusion of L3-4. A surgical instrument is
identified posterior to the disc space at L1-2.
IMPRESSION: Intraoperative localization.

## 2021-08-18 IMAGING — RF DG LUMBAR SPINE 2-3V
1 series · 2 of 2 positions shown · non-contrast
Comparison: [DATE] at [DATE] a.m.

CLINICAL DATA: L1-2 posterior lumbar interbody fusion with
posterior fixation from L1-L3. Fluoroscopy time 12 seconds.

EXAM:
LUMBAR SPINE - 2-3 VIEW; DG C-ARM 1-60 MIN-NO REPORT

[Series 1: run · 2 of 2 slices shown]
[im 1/2]
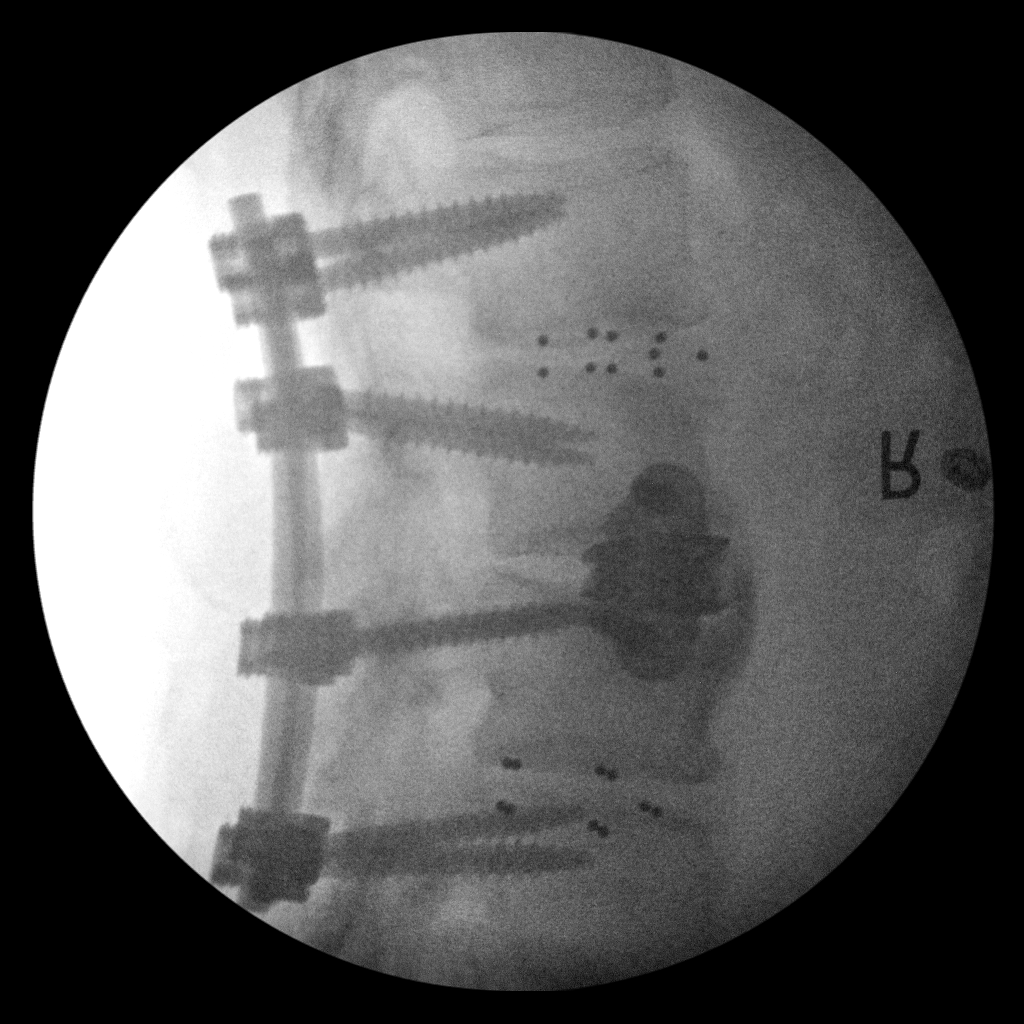
[im 2/2]
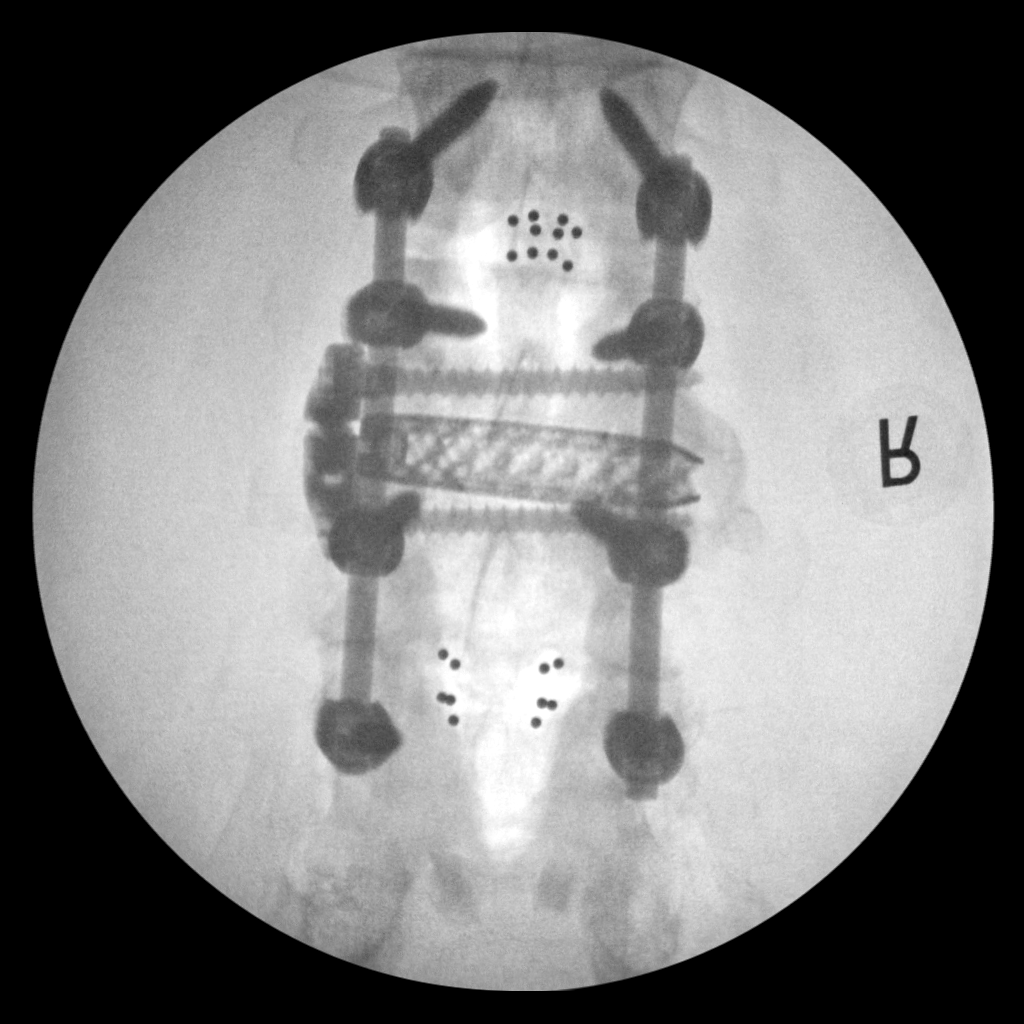

[2 of 2 positions shown; findings below may reference images not displayed]

FINDINGS: Remote posterior fusion of L2-L4. Remote interbody fusion of L2-3.
Remote posterior fusion of L3-4. New interbody fusion device at L1-2
with transpedicular screws now at L1 and L2. The posterior fixation
rods have been extended from L1-L4.
IMPRESSION: Intraoperative images during posterior fusion.

## 2021-08-18 SURGERY — POSTERIOR LUMBAR FUSION 1 LEVEL
Anesthesia: General | Site: Back

## 2021-08-18 MED ORDER — MIDAZOLAM HCL 2 MG/2ML IJ SOLN
INTRAMUSCULAR | Status: DC | PRN
Start: 2021-08-18 — End: 2021-08-18
  Administered 2021-08-18: 2 mg via INTRAVENOUS

## 2021-08-18 MED ORDER — THROMBIN 20000 UNITS EX SOLR
CUTANEOUS | Status: AC
  Filled 2021-08-18: qty 20000

## 2021-08-18 MED ORDER — FLEET ENEMA 7-19 GM/118ML RE ENEM: 1.0000 | ENEMA | Freq: Once | RECTAL | Status: DC | PRN

## 2021-08-18 MED ORDER — PREDNISONE 5 MG PO TABS
5.0000 mg | ORAL_TABLET | Freq: Two times a day (BID) | ORAL | Status: DC
  Administered 2021-08-18 – 2021-08-19 (×2): 5 mg via ORAL
  Filled 2021-08-18 (×3): qty 1

## 2021-08-18 MED ORDER — PHENOL 1.4 % MT LIQD: 1.0000 | OROMUCOSAL | Status: DC | PRN

## 2021-08-18 MED ORDER — SUGAMMADEX SODIUM 200 MG/2ML IV SOLN
INTRAVENOUS | Status: DC | PRN
  Administered 2021-08-18: 260 mg via INTRAVENOUS

## 2021-08-18 MED ORDER — ACETAMINOPHEN 10 MG/ML IV SOLN
INTRAVENOUS | Status: DC | PRN
  Administered 2021-08-18: 1000 mg via INTRAVENOUS

## 2021-08-18 MED ORDER — LIDOCAINE 2% (20 MG/ML) 5 ML SYRINGE
INTRAMUSCULAR | Status: DC | PRN
  Administered 2021-08-18: 100 mg via INTRAVENOUS

## 2021-08-18 MED ORDER — CEFAZOLIN SODIUM-DEXTROSE 2-4 GM/100ML-% IV SOLN
2.0000 g | Freq: Three times a day (TID) | INTRAVENOUS | Status: AC
  Administered 2021-08-18 – 2021-08-19 (×2): 2 g via INTRAVENOUS
  Filled 2021-08-18 (×2): qty 100

## 2021-08-18 MED ORDER — DEXAMETHASONE SODIUM PHOSPHATE 10 MG/ML IJ SOLN
INTRAMUSCULAR | Status: AC
  Filled 2021-08-18: qty 1

## 2021-08-18 MED ORDER — ONDANSETRON HCL 4 MG/2ML IJ SOLN: 4.0000 mg | Freq: Four times a day (QID) | INTRAMUSCULAR | Status: DC | PRN

## 2021-08-18 MED ORDER — ROCURONIUM BROMIDE 10 MG/ML (PF) SYRINGE
PREFILLED_SYRINGE | INTRAVENOUS | Status: DC | PRN
  Administered 2021-08-18 (×2): 30 mg via INTRAVENOUS
  Administered 2021-08-18: 20 mg via INTRAVENOUS
  Administered 2021-08-18: 70 mg via INTRAVENOUS
  Administered 2021-08-18: 20 mg via INTRAVENOUS

## 2021-08-18 MED ORDER — IRBESARTAN 150 MG PO TABS
150.0000 mg | ORAL_TABLET | Freq: Every day | ORAL | Status: DC
  Administered 2021-08-19: 150 mg via ORAL
  Filled 2021-08-18: qty 1

## 2021-08-18 MED ORDER — SODIUM CHLORIDE 0.9% FLUSH: 3.0000 mL | INTRAVENOUS | Status: DC | PRN

## 2021-08-18 MED ORDER — PROPOFOL 10 MG/ML IV BOLUS
INTRAVENOUS | Status: AC
  Filled 2021-08-18: qty 20

## 2021-08-18 MED ORDER — CHLORHEXIDINE GLUCONATE 0.12 % MT SOLN
15.0000 mL | Freq: Once | OROMUCOSAL | Status: AC
  Administered 2021-08-18: 15 mL via OROMUCOSAL
  Filled 2021-08-18: qty 15

## 2021-08-18 MED ORDER — ACETAMINOPHEN 325 MG PO TABS: 650.0000 mg | ORAL_TABLET | ORAL | Status: DC | PRN

## 2021-08-18 MED ORDER — FENTANYL CITRATE (PF) 250 MCG/5ML IJ SOLN
INTRAMUSCULAR | Status: AC
  Filled 2021-08-18: qty 5

## 2021-08-18 MED ORDER — FUROSEMIDE 20 MG PO TABS
20.0000 mg | ORAL_TABLET | Freq: Two times a day (BID) | ORAL | Status: DC
  Administered 2021-08-18 – 2021-08-19 (×2): 20 mg via ORAL
  Filled 2021-08-18 (×2): qty 1

## 2021-08-18 MED ORDER — BUPROPION HCL ER (XL) 300 MG PO TB24
300.0000 mg | ORAL_TABLET | Freq: Every day | ORAL | Status: DC
  Filled 2021-08-18: qty 1

## 2021-08-18 MED ORDER — BUPIVACAINE HCL (PF) 0.5 % IJ SOLN
INTRAMUSCULAR | Status: AC
  Filled 2021-08-18: qty 30

## 2021-08-18 MED ORDER — OXYCODONE-ACETAMINOPHEN 5-325 MG PO TABS
1.0000 | ORAL_TABLET | ORAL | Status: DC | PRN
Start: 2021-08-18 — End: 2021-08-19
  Administered 2021-08-18 – 2021-08-19 (×4): 2 via ORAL
  Filled 2021-08-18 (×5): qty 2

## 2021-08-18 MED ORDER — HYDROMORPHONE HCL 1 MG/ML IJ SOLN
INTRAMUSCULAR | Status: DC | PRN
  Administered 2021-08-18: .5 mg via INTRAVENOUS

## 2021-08-18 MED ORDER — HYDROMORPHONE HCL 1 MG/ML IJ SOLN
INTRAMUSCULAR | Status: AC
  Filled 2021-08-18: qty 0.5

## 2021-08-18 MED ORDER — METHOCARBAMOL 1000 MG/10ML IJ SOLN
500.0000 mg | Freq: Four times a day (QID) | INTRAVENOUS | Status: DC | PRN
  Filled 2021-08-18: qty 5

## 2021-08-18 MED ORDER — HYDROMORPHONE HCL 1 MG/ML IJ SOLN
0.2500 mg | INTRAMUSCULAR | Status: DC | PRN
  Administered 2021-08-18: 0.25 mg via INTRAVENOUS

## 2021-08-18 MED ORDER — PHENYLEPHRINE HCL-NACL 20-0.9 MG/250ML-% IV SOLN
INTRAVENOUS | Status: DC | PRN
  Administered 2021-08-18: 30 ug/min via INTRAVENOUS

## 2021-08-18 MED ORDER — FENTANYL CITRATE (PF) 250 MCG/5ML IJ SOLN
INTRAMUSCULAR | Status: DC | PRN
  Administered 2021-08-18 (×2): 25 ug via INTRAVENOUS
  Administered 2021-08-18 (×2): 50 ug via INTRAVENOUS
  Administered 2021-08-18: 100 ug via INTRAVENOUS

## 2021-08-18 MED ORDER — ACETAMINOPHEN 10 MG/ML IV SOLN
INTRAVENOUS | Status: AC
  Filled 2021-08-18: qty 100

## 2021-08-18 MED ORDER — MEPERIDINE HCL 25 MG/ML IJ SOLN: 6.2500 mg | INTRAMUSCULAR | Status: DC | PRN

## 2021-08-18 MED ORDER — THROMBIN 20000 UNITS EX SOLR
CUTANEOUS | Status: DC | PRN
  Administered 2021-08-18: 20 mL via TOPICAL

## 2021-08-18 MED ORDER — PROPOFOL 10 MG/ML IV BOLUS
INTRAVENOUS | Status: DC | PRN
  Administered 2021-08-18: 160 mg via INTRAVENOUS

## 2021-08-18 MED ORDER — HYDROMORPHONE HCL 1 MG/ML IJ SOLN
INTRAMUSCULAR | Status: AC
  Administered 2021-08-18: 0.25 mg via INTRAVENOUS
  Filled 2021-08-18: qty 1

## 2021-08-18 MED ORDER — SODIUM CHLORIDE 0.9% FLUSH
3.0000 mL | Freq: Two times a day (BID) | INTRAVENOUS | Status: DC
  Administered 2021-08-18: 3 mL via INTRAVENOUS

## 2021-08-18 MED ORDER — LIDOCAINE-EPINEPHRINE 1 %-1:100000 IJ SOLN
INTRAMUSCULAR | Status: AC
  Filled 2021-08-18: qty 1

## 2021-08-18 MED ORDER — THROMBIN 5000 UNITS EX SOLR
OROMUCOSAL | Status: DC | PRN
  Administered 2021-08-18: 5 mL via TOPICAL

## 2021-08-18 MED ORDER — DEXTROSE IN LACTATED RINGERS 5 % IV SOLN: INTRAVENOUS | Status: DC

## 2021-08-18 MED ORDER — ALUM & MAG HYDROXIDE-SIMETH 200-200-20 MG/5ML PO SUSP: 30.0000 mL | Freq: Four times a day (QID) | ORAL | Status: DC | PRN

## 2021-08-18 MED ORDER — METHOCARBAMOL 500 MG PO TABS
500.0000 mg | ORAL_TABLET | Freq: Four times a day (QID) | ORAL | Status: DC | PRN
  Administered 2021-08-18 – 2021-08-19 (×4): 500 mg via ORAL
  Filled 2021-08-18 (×4): qty 1

## 2021-08-18 MED ORDER — ROCURONIUM BROMIDE 10 MG/ML (PF) SYRINGE
PREFILLED_SYRINGE | INTRAVENOUS | Status: AC
  Filled 2021-08-18: qty 10

## 2021-08-18 MED ORDER — ATENOLOL 25 MG PO TABS
25.0000 mg | ORAL_TABLET | Freq: Every day | ORAL | Status: DC
  Administered 2021-08-19: 25 mg via ORAL
  Filled 2021-08-18: qty 1

## 2021-08-18 MED ORDER — LIDOCAINE-EPINEPHRINE 1 %-1:100000 IJ SOLN
INTRAMUSCULAR | Status: DC | PRN
  Administered 2021-08-18: 5 mL

## 2021-08-18 MED ORDER — BISACODYL 10 MG RE SUPP: 10.0000 mg | Freq: Every day | RECTAL | Status: DC | PRN

## 2021-08-18 MED ORDER — SODIUM CHLORIDE 0.9 % IV SOLN: 250.0000 mL | INTRAVENOUS | Status: DC

## 2021-08-18 MED ORDER — PHENYLEPHRINE 40 MCG/ML (10ML) SYRINGE FOR IV PUSH (FOR BLOOD PRESSURE SUPPORT)
PREFILLED_SYRINGE | INTRAVENOUS | Status: AC
  Filled 2021-08-18: qty 10

## 2021-08-18 MED ORDER — MENTHOL 3 MG MT LOZG: 1.0000 | LOZENGE | OROMUCOSAL | Status: DC | PRN

## 2021-08-18 MED ORDER — POLYETHYLENE GLYCOL 3350 17 G PO PACK: 17.0000 g | PACK | Freq: Every day | ORAL | Status: DC | PRN

## 2021-08-18 MED ORDER — ORAL CARE MOUTH RINSE: 15.0000 mL | Freq: Once | OROMUCOSAL | Status: AC

## 2021-08-18 MED ORDER — ACETAMINOPHEN 650 MG RE SUPP: 650.0000 mg | RECTAL | Status: DC | PRN

## 2021-08-18 MED ORDER — BUPIVACAINE HCL (PF) 0.5 % IJ SOLN
INTRAMUSCULAR | Status: DC | PRN
  Administered 2021-08-18: 5 mL
  Administered 2021-08-18: 25 mL

## 2021-08-18 MED ORDER — DEXTROSE 5 % IV SOLN
INTRAVENOUS | Status: DC | PRN
  Administered 2021-08-18: 3 g via INTRAVENOUS

## 2021-08-18 MED ORDER — CHLORHEXIDINE GLUCONATE CLOTH 2 % EX PADS: 6.0000 | MEDICATED_PAD | Freq: Once | CUTANEOUS | Status: DC

## 2021-08-18 MED ORDER — SUCCINYLCHOLINE CHLORIDE 200 MG/10ML IV SOSY
PREFILLED_SYRINGE | INTRAVENOUS | Status: AC
  Filled 2021-08-18: qty 10

## 2021-08-18 MED ORDER — SENNA 8.6 MG PO TABS
1.0000 | ORAL_TABLET | Freq: Two times a day (BID) | ORAL | Status: DC
  Administered 2021-08-18 – 2021-08-19 (×2): 8.6 mg via ORAL
  Filled 2021-08-18 (×2): qty 1

## 2021-08-18 MED ORDER — LIDOCAINE 2% (20 MG/ML) 5 ML SYRINGE
INTRAMUSCULAR | Status: AC
  Filled 2021-08-18: qty 5

## 2021-08-18 MED ORDER — ONDANSETRON HCL 4 MG PO TABS: 4.0000 mg | ORAL_TABLET | Freq: Four times a day (QID) | ORAL | Status: DC | PRN

## 2021-08-18 MED ORDER — THROMBIN 5000 UNITS EX SOLR
CUTANEOUS | Status: AC
  Filled 2021-08-18: qty 5000

## 2021-08-18 MED ORDER — KETOROLAC TROMETHAMINE 15 MG/ML IJ SOLN
INTRAMUSCULAR | Status: DC | PRN
  Administered 2021-08-18: 15 mg via INTRAVENOUS

## 2021-08-18 MED ORDER — DOCUSATE SODIUM 100 MG PO CAPS
100.0000 mg | ORAL_CAPSULE | Freq: Two times a day (BID) | ORAL | Status: DC
  Administered 2021-08-18 – 2021-08-19 (×2): 100 mg via ORAL
  Filled 2021-08-18 (×2): qty 1

## 2021-08-18 MED ORDER — LACTATED RINGERS IV SOLN: INTRAVENOUS | Status: DC

## 2021-08-18 MED ORDER — ONDANSETRON HCL 4 MG/2ML IJ SOLN
INTRAMUSCULAR | Status: AC
  Filled 2021-08-18: qty 2

## 2021-08-18 MED ORDER — ONDANSETRON HCL 4 MG/2ML IJ SOLN
INTRAMUSCULAR | Status: DC | PRN
  Administered 2021-08-18: 4 mg via INTRAVENOUS

## 2021-08-18 MED ORDER — ALBUTEROL SULFATE HFA 108 (90 BASE) MCG/ACT IN AERS: 2.0000 | INHALATION_SPRAY | Freq: Four times a day (QID) | RESPIRATORY_TRACT | Status: DC | PRN

## 2021-08-18 MED ORDER — DEXAMETHASONE SODIUM PHOSPHATE 10 MG/ML IJ SOLN
INTRAMUSCULAR | Status: DC | PRN
  Administered 2021-08-18: 10 mg via INTRAVENOUS

## 2021-08-18 MED ORDER — 0.9 % SODIUM CHLORIDE (POUR BTL) OPTIME
TOPICAL | Status: DC | PRN
  Administered 2021-08-18: 1000 mL

## 2021-08-18 MED ORDER — PHENYLEPHRINE 40 MCG/ML (10ML) SYRINGE FOR IV PUSH (FOR BLOOD PRESSURE SUPPORT)
PREFILLED_SYRINGE | INTRAVENOUS | Status: DC | PRN
  Administered 2021-08-18: 120 ug via INTRAVENOUS
  Administered 2021-08-18: 80 ug via INTRAVENOUS
  Administered 2021-08-18: 120 ug via INTRAVENOUS

## 2021-08-18 MED ORDER — MORPHINE SULFATE (PF) 2 MG/ML IV SOLN: 2.0000 mg | INTRAVENOUS | Status: DC | PRN

## 2021-08-18 MED ORDER — MIDAZOLAM HCL 2 MG/2ML IJ SOLN
INTRAMUSCULAR | Status: AC
  Filled 2021-08-18: qty 2

## 2021-08-18 MED ORDER — PROMETHAZINE HCL 25 MG/ML IJ SOLN
INTRAMUSCULAR | Status: AC
  Filled 2021-08-18: qty 1

## 2021-08-18 MED ORDER — CEFAZOLIN IN SODIUM CHLORIDE 3-0.9 GM/100ML-% IV SOLN
3.0000 g | INTRAVENOUS | Status: DC
  Filled 2021-08-18: qty 100

## 2021-08-18 MED ORDER — PROMETHAZINE HCL 25 MG/ML IJ SOLN
6.2500 mg | INTRAMUSCULAR | Status: DC | PRN
  Administered 2021-08-18: 12.5 mg via INTRAVENOUS

## 2021-08-18 SURGICAL SUPPLY — 66 items
ADH SKN CLS APL DERMABOND .7 (GAUZE/BANDAGES/DRESSINGS) ×1
APL SRG 60D 8 XTD TIP BNDBL (TIP)
BAG COUNTER SPONGE SURGICOUNT (BAG) ×3 IMPLANT
BAG SPNG CNTER NS LX DISP (BAG) ×2
BAG SURGICOUNT SPONGE COUNTING (BAG) ×2
BASKET BONE COLLECTION (BASKET) ×3 IMPLANT
BLADE CLIPPER SURG (BLADE) IMPLANT
BONE CANC CHIPS 20CC PCAN1/4 (Bone Implant) ×3 IMPLANT
BUR MATCHSTICK NEURO 3.0 LAGG (BURR) ×3 IMPLANT
CAGE COROENT MP 8X23 (Cage) ×4 IMPLANT
CANISTER SUCT 3000ML PPV (MISCELLANEOUS) ×3 IMPLANT
CHIPS CANC BONE 20CC PCAN1/4 (Bone Implant) ×1 IMPLANT
CNTNR URN SCR LID CUP LEK RST (MISCELLANEOUS) ×1 IMPLANT
CONT SPEC 4OZ STRL OR WHT (MISCELLANEOUS) ×3
COVER BACK TABLE 60X90IN (DRAPES) ×3 IMPLANT
DECANTER SPIKE VIAL GLASS SM (MISCELLANEOUS) ×5 IMPLANT
DERMABOND ADVANCED (GAUZE/BANDAGES/DRESSINGS) ×2
DERMABOND ADVANCED .7 DNX12 (GAUZE/BANDAGES/DRESSINGS) ×1 IMPLANT
DEVICE DISSECT PLASMABLAD 3.0S (MISCELLANEOUS) ×1 IMPLANT
DRAPE C-ARM 42X72 X-RAY (DRAPES) ×6 IMPLANT
DRAPE HALF SHEET 40X57 (DRAPES) ×2 IMPLANT
DRAPE LAPAROTOMY 100X72X124 (DRAPES) ×3 IMPLANT
DRSG OPSITE POSTOP 4X8 (GAUZE/BANDAGES/DRESSINGS) ×2 IMPLANT
DURAPREP 26ML APPLICATOR (WOUND CARE) ×3 IMPLANT
DURASEAL APPLICATOR TIP (TIP) IMPLANT
DURASEAL SPINE SEALANT 3ML (MISCELLANEOUS) IMPLANT
ELECT REM PT RETURN 9FT ADLT (ELECTROSURGICAL) ×3
ELECTRODE REM PT RTRN 9FT ADLT (ELECTROSURGICAL) ×1 IMPLANT
GAUZE 4X4 16PLY ~~LOC~~+RFID DBL (SPONGE) ×2 IMPLANT
GAUZE SPONGE 4X4 12PLY STRL (GAUZE/BANDAGES/DRESSINGS) ×3 IMPLANT
GLOVE SURG LTX SZ8.5 (GLOVE) ×6 IMPLANT
GLOVE SURG UNDER POLY LF SZ8.5 (GLOVE) ×6 IMPLANT
GOWN STRL REUS W/ TWL LRG LVL3 (GOWN DISPOSABLE) IMPLANT
GOWN STRL REUS W/ TWL XL LVL3 (GOWN DISPOSABLE) IMPLANT
GOWN STRL REUS W/TWL 2XL LVL3 (GOWN DISPOSABLE) ×6 IMPLANT
GOWN STRL REUS W/TWL LRG LVL3 (GOWN DISPOSABLE)
GOWN STRL REUS W/TWL XL LVL3 (GOWN DISPOSABLE)
GRAFT BNE CANC CHIPS 1-8 20CC (Bone Implant) IMPLANT
GRAFT BONE PROTEIOS LRG 5CC (Orthopedic Implant) ×2 IMPLANT
HEMOSTAT POWDER KIT SURGIFOAM (HEMOSTASIS) ×2 IMPLANT
KIT BASIN OR (CUSTOM PROCEDURE TRAY) ×3 IMPLANT
KIT GRAFTMAG DEL NEURO DISP (NEUROSURGERY SUPPLIES) ×3 IMPLANT
KIT TURNOVER KIT B (KITS) ×3 IMPLANT
MILL MEDIUM DISP (BLADE) ×3 IMPLANT
NEEDLE HYPO 22GX1.5 SAFETY (NEEDLE) ×3 IMPLANT
NS IRRIG 1000ML POUR BTL (IV SOLUTION) ×3 IMPLANT
PACK LAMINECTOMY NEURO (CUSTOM PROCEDURE TRAY) ×3 IMPLANT
PAD ARMBOARD 7.5X6 YLW CONV (MISCELLANEOUS) ×9 IMPLANT
PATTIES SURGICAL .5 X1 (DISPOSABLE) ×3 IMPLANT
PLASMABLADE 3.0S (MISCELLANEOUS) ×3
ROD RELINE-O LORD 5.5X110MM (Rod) ×4 IMPLANT
SCREW LOCK RELINE 5.5 TULIP (Screw) ×16 IMPLANT
SCREW RELINE-O POLY 6.5X45 (Screw) ×8 IMPLANT
SPONGE SURGIFOAM ABS GEL 100 (HEMOSTASIS) ×3 IMPLANT
SPONGE T-LAP 4X18 ~~LOC~~+RFID (SPONGE) ×2 IMPLANT
SUT PROLENE 6 0 BV (SUTURE) IMPLANT
SUT VIC AB 1 CT1 18XBRD ANBCTR (SUTURE) ×1 IMPLANT
SUT VIC AB 1 CT1 8-18 (SUTURE) ×6
SUT VIC AB 2-0 CP2 18 (SUTURE) ×3 IMPLANT
SUT VIC AB 3-0 SH 8-18 (SUTURE) ×3 IMPLANT
SUT VIC AB 4-0 RB1 18 (SUTURE) ×3 IMPLANT
SYR 3ML LL SCALE MARK (SYRINGE) ×12 IMPLANT
TOWEL GREEN STERILE (TOWEL DISPOSABLE) ×3 IMPLANT
TOWEL GREEN STERILE FF (TOWEL DISPOSABLE) ×3 IMPLANT
TRAY FOLEY MTR SLVR 16FR STAT (SET/KITS/TRAYS/PACK) ×3 IMPLANT
WATER STERILE IRR 1000ML POUR (IV SOLUTION) ×3 IMPLANT

## 2021-08-18 NOTE — Op Note (Signed)
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  L2-L3 pseudoarthrosis.  Status post decompression arthrodesis L3 to sacrum. Postoperative diagnosis: Same Procedure: Posterior decompression L1-L2 with laminectomy and more work than planning for simple interbody technique.  Posterior lumbar interbody arthrodesis using peek spacers local autograft allograft and Proteus.  Segmental fixation L1-L4.  Posterolateral arthrodesis L1-L4. 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  He was advised regarding the need for surgery to decompress L1-L2 performed posterior arthrodesis here and also to reinforce the pseudoarthrosis that is developed at L2-3 with pedicle fixation from L1 down to L4 there is previous posterior hardware from the L3-4 level.  Procedure: Patient was brought to the operating room supine on the stretcher.  After the smooth induction of general endotracheal anesthesia the back was prepped with alcohol DuraPrep and draped in a sterile fashion.  Midline incision was created and carried down to the lumbodorsal fascia.  Fascia was opened on either side of midline to expose the posterior spinous processes.  Then by identifying the interspace at the L1-L2 level positively with a radiograph dissection was carried down to expose in a subperiosteal fashion the lamina and interlaminar spaces from L1 to all the way down to L3-4.  The old hardware was exposed at L3-L4 this was packed off and expose for later disarticulation of the rods and use of the pedicle screws to form a construct from L1-L4.  At  L1-L2 a significant laminectomy was created removing the inferior margin lamina of L1 out to and including the entirety of the facet at L1 to the thickened redundant yellow ligament in this area was taken up and the common dural tube was explored.  There is a large central disc herniation that was approached from the right side at the L1-L2 level and several large fragments of disc were removed from this region then the disc space was entered and a complete discectomy was performed at L1-L2.  This allowed for good decompression of the central canal.  Then by performing a complete discectomy at L1-L2 a series of spacers could be introduced to expand the interspace and ultimately was felt that a 8 x 9 x 23 mm spacer with 4 degrees of lordosis could be placed into the interval.  The endplates were prepared had a total of 10 cc of bone graft was packed into the disc space along with a 2 peek spacers.  Pedicle entry sites were then chosen at L1 and L2 and 6.5 x 45 mm pedicle screws were placed into each of the pedicles individually checking AP and lateral fluoroscopy.  When good placement of the screws was obtained the old hardware was disarticulated and the rods removed and then 110 mm precontoured rod was used to connect the screws from L1-L4.  Some adjustment of the L3 screw was required in order to have the construct lie flat in a neutral posture.  With that the hardware was tightened lateral gutters had been decorticated between L1 and L2 and they were packed with an additional 3 cc each of bone graft into the lateral gutters the facets at L2-3 were taken down and these were packed with an additional 3 cc of  bone graft into the facet joints hemostasis was then carefully checked care was taken to make sure that the L1 and L2 nerve roots and the common dural tube were well decompressed which they were and then went final hemostasis was obtained the lumbodorsal fascia was closed with #1 Vicryl in interrupted fashion  2-0 Vicryl was used in the subcutaneous tissues and 3-0 Vicryl subcuticularly along with 4-0 Vicryl in the subcuticular layer Dermabond was placed on the skin blood loss was estimated at 500 cc.  The patient tolerated procedure well is returned to recovery room in stable condition.

## 2021-08-18 NOTE — H&P (Signed)
Gregory Hinton is an 55 y.o. male.   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  He then had subsequent disease at L3-4 and more recently at L2-3 and now has degenerative spondylolisthesis at the L1-2 level.  He previously had decompression stabilization using an anterolateral procedure at the L2-3 level.  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  Past Medical History:  Diagnosis Date   Adrenal insufficiency (HCC)    Anemia    Anxiety    Arthritis    Back pain    had 3 fusions surgeries   H/O cardiovascular stress test 2014   admitted at hosp. overnight for chest pain, stress/echo test wnl   Hypertension    PONV (postoperative nausea and vomiting)    Rheumatoid arthritis (HCC)    manages by Dr. Corliss Skains, rec'd Orencia weekly for 2 months, switched from Humira     Past Surgical History:  Procedure Laterality Date   ANTERIOR LAT LUMBAR FUSION Left 12/26/2018   Procedure: Left Lumbar Two-Three Anterolateral decompression/interbody fusion/lateral plate;  Surgeon: Barnett Abu, MD;  Location: [facility] OR;  Service: Neurosurgery;  Laterality: Left;  Anterolateral   BACK SURGERY  [phone]   lumbar fusion 3 times   CARPAL TUNNEL RELEASE Right 2000   and left side 2 years ago with left ulnar surgery   COLONOSCOPY     HERNIA REPAIR Right 2001   inguinal    REPLACEMENT TOTAL KNEE Left 05/2020    Family History  Problem Relation Age of Onset   Aneurysm Mother    Leukemia Father    Healthy Daughter    Healthy Son    Social History:  reports that he quit smoking about 23 years ago. His smoking use included cigarettes. He has a 1.25 pack-year smoking history. He has never used smokeless tobacco. He reports current alcohol use. He  reports that he does not use drugs.  Allergies:  Allergies  Allergen Reactions   Eggs Or Egg-Derived Products Diarrhea and Nausea And Vomiting    Medications Prior to Admission  Medication Sig Dispense Refill   albuterol (VENTOLIN HFA) 108 (90 Base) MCG/ACT inhaler Inhale 2 puffs into the lungs every 6 (six) hours as needed for wheezing or shortness of breath. 8 g 6   atenolol (TENORMIN) 25 MG tablet Take 25 mg by mouth daily.     buPROPion (WELLBUTRIN XL) 300 MG 24 hr tablet Take 300 mg by mouth daily.     cholecalciferol (VITAMIN D) 1000 units tablet Take 1,000 Units by mouth daily.      furosemide (LASIX) 20 MG tablet Take 20 mg by mouth 2 (two) times daily.     ibuprofen (ADVIL,MOTRIN) 200 MG tablet Take 800 mg by mouth every 8 (eight) hours as needed for mild pain or moderate pain.      olmesartan (BENICAR) 20 MG tablet Take 20 mg by mouth at bedtime as needed.     predniSONE (DELTASONE) 5 MG tablet Take 5 mg by mouth in the morning and at bedtime.     Abatacept 125 MG/ML SOAJ INJECT 125 MG INTO THE SKIN ONCE A WEEK. (Patient not taking: No sig reported) 12 mL 0   leflunomide (ARAVA) 20 MG tablet Take 1 tablet (20 mg total) by mouth daily.  90 tablet 0    No results found for this or any previous visit (from the past 48 hour(s)). No results found.  Review of Systems  Constitutional:  Positive for activity change.  Musculoskeletal:  Positive for back pain and myalgias.  Neurological:  Positive for weakness and numbness.  All other systems reviewed and are negative.  Blood pressure 128/69, pulse 77, temperature 98.2 F (36.8 C), temperature source Oral, resp. rate 17, height 6\' 4"  (1.93 m), weight 129.3 kg, SpO2 97 %. Physical Exam Vitals reviewed.  Constitutional:      Appearance: Normal appearance. He is obese.  HENT:     Right Ear: Tympanic membrane normal.     Left Ear: Tympanic membrane normal.     Nose: Nose normal.     Mouth/Throat:     Mouth: Mucous membranes are  moist.  Eyes:     Extraocular Movements: Extraocular movements intact.     Pupils: Pupils are equal, round, and reactive to light.  Cardiovascular:     Pulses: Normal pulses.     Heart sounds: Normal heart sounds.  Pulmonary:     Effort: Pulmonary effort is normal.     Breath sounds: Normal breath sounds.  Abdominal:     General: Abdomen is flat. Bowel sounds are normal.     Palpations: Abdomen is soft.  Musculoskeletal:        General: Normal range of motion.     Cervical back: Normal range of motion and neck supple.  Skin:    General: Skin is warm and dry.     Capillary Refill: Capillary refill takes less than 2 seconds.  Neurological:     General: No focal deficit present.     Mental Status: He is alert.     Comments: Patient has absent reflexes in the patella and the Achilles both.  Decree sensation is noted in the distal lower extremity  Psychiatric:        Mood and Affect: Mood normal.        Behavior: Behavior normal.        Thought Content: Thought content normal.        Judgment: Judgment normal.     Assessment/Plan Spondylosis and stenosis L1-L2.  Pseudoarthrosis L2-L3.  Posterior decompression and fusion L1-L2 posterior fixation L1 L3.  , MD 08/18/2021, 7:44 AM

## 2021-08-18 NOTE — Transfer of Care (Signed)
Immediate Anesthesia Transfer of Care Note  Patient: Gregory Hinton  Procedure(s) Performed: Lumbar One-Two Posterior lumbar interbody fusion with posterior fixation from Lumbar One to Lumbar Three (Back)  Patient Location: PACU  Anesthesia Type:General  Level of Consciousness: awake, alert , oriented and patient cooperative  Airway & Oxygen Therapy: Patient Spontanous Breathing and Patient connected to face mask oxygen  Post-op Assessment: Report given to RN, Post -op Vital signs reviewed and stable and Patient moving all extremities X 4  Post vital signs: Reviewed and stable  Last Vitals:  Vitals Value Taken Time  BP 123/69 08/18/21 1211  Temp    Pulse 98 08/18/21 1214  Resp 17 08/18/21 1214  SpO2 100 % 08/18/21 1214  Vitals shown include unvalidated device data.  Last Pain:  Vitals:   08/18/21 0637  TempSrc:   PainSc: 6       Patients Stated Pain Goal: 3 (08/18/21 0964)  Complications: No notable events documented.

## 2021-08-18 NOTE — Progress Notes (Signed)
Orthopedic Tech Progress Note Patient Details:  Gregory Hinton [birth year] [id number]  PACU RN called requesting a LSO BACK BRACE   Ortho Devices Type of Ortho Device: Lumbar corsett Ortho Device/Splint Location: BACK Ortho Device/Splint Interventions: Ordered   Post Interventions Patient Tolerated: Well Instructions Provided: Care of device  Donald Pore 08/18/2021, 1:08 PM

## 2021-08-18 NOTE — Anesthesia Procedure Notes (Signed)
Procedure Name: Intubation Date/Time: 08/18/2021 7:57 AM Performed by: Lendon Ka, CRNA Pre-anesthesia Checklist: Patient identified, Emergency Drugs available, Suction available and Patient being monitored Patient Re-evaluated:Patient Re-evaluated prior to induction Oxygen Delivery Method: Circle System Utilized Preoxygenation: Pre-oxygenation with 100% oxygen Induction Type: IV induction Ventilation: Mask ventilation without difficulty Laryngoscope Size: Glidescope and 4 Grade View: Grade I Tube type: Oral Number of attempts: 1 Airway Equipment and Method: Stylet Placement Confirmation: ETT inserted through vocal cords under direct vision, positive ETCO2 and breath sounds checked- equal and bilateral Secured at: 24 cm Tube secured with: Tape Dental Injury: Teeth and Oropharynx as per pre-operative assessment  Comments: With ease

## 2021-08-19 MED ORDER — OXYCODONE-ACETAMINOPHEN 5-325 MG PO TABS
1.0000 | ORAL_TABLET | ORAL | 0 refills | Status: DC | PRN
Start: 2021-08-19 — End: 2022-08-03

## 2021-08-19 MED ORDER — METHOCARBAMOL 500 MG PO TABS
500.0000 mg | ORAL_TABLET | Freq: Four times a day (QID) | ORAL | 3 refills | Status: DC | PRN
Start: 2021-08-19 — End: 2022-08-03

## 2021-08-19 MED ORDER — BACITRACIN-NEOMYCIN-POLYMYXIN OINTMENT TUBE
TOPICAL_OINTMENT | CUTANEOUS | Status: DC | PRN
  Filled 2021-08-19: qty 14

## 2021-08-19 NOTE — Discharge Summary (Signed)
Physician Discharge Summary  Patient ID: Gregory Hinton MRN: [id number] DOB/AGE: 1965-10-10 55 y.o.  Admit date: 08/18/2021 Discharge date: 08/19/2021  Admission Diagnoses: Lumbar stenosis L1-L2 with herniated nucleus pulposus.  History of fusion L2 to sacrum.  Pseudoarthrosis L2-3.  Discharge Diagnoses: Lumbar stenosis L1-L2.  Herniated nucleus pulposus L1-L2.  History of fusion L2 to sacrum.  Pseudoarthrosis L2-L3.  History of rheumatoid arthritis. Active Problems:   Lumbar stenosis with neurogenic claudication   Discharged Condition: good  Hospital Course: Patient was admitted to undergo surgical decompression at L1-L2 with posterior fixation from L1 to previous hardware at L3-L4.  He tolerated surgery well.  He is ambulatory.  He is discharged home today.  Consults:  None  Significant Diagnostic Studies: None  Treatments: surgery: See op note  Discharge Exam: Blood pressure (!) 142/88, pulse (!) 107, temperature 98 F (36.7 C), resp. rate 18, height 6\' 4"  (1.93 m), weight 129.3 kg, SpO2 97 %. Incision is clean and dry motor function is intact.  Disposition: Discharge disposition: 01-Home or Self Care       Discharge Instructions     Call MD for:  redness, tenderness, or signs of infection (pain, swelling, redness, odor or green/yellow discharge around incision site)   Complete by: As directed    Call MD for:  severe uncontrolled pain   Complete by: As directed    Call MD for:  temperature >100.4   Complete by: As directed    Diet - low sodium heart healthy   Complete by: As directed    Discharge wound care:   Complete by: As directed    Okay to shower. Do not apply salves or appointments to incision. No heavy lifting with the upper extremities greater than 10  pounds. May resume driving when not requiring pain medication and patient feels comfortable with doing so.   Incentive spirometry RT   Complete by: As directed    Increase activity slowly   Complete  by: As directed       Allergies as of 08/19/2021       Reactions   Eggs Or Egg-derived Products Diarrhea, Nausea And Vomiting        Medication List     TAKE these medications    albuterol 108 (90 Base) MCG/ACT inhaler Commonly known as: VENTOLIN HFA Inhale 2 puffs into the lungs every 6 (six) hours as needed for wheezing or shortness of breath.   atenolol 25 MG tablet Commonly known as: TENORMIN Take 25 mg by mouth daily.   buPROPion 300 MG 24 hr tablet Commonly known as: WELLBUTRIN XL Take 300 mg by mouth daily.   cholecalciferol 1000 units tablet Commonly known as: VITAMIN D Take 1,000 Units by mouth daily.   furosemide 20 MG tablet Commonly known as: LASIX Take 20 mg by mouth 2 (two) times daily.   ibuprofen 200 MG tablet Commonly known as: ADVIL Take 800 mg by mouth every 8 (eight) hours as needed for mild pain or moderate pain.   leflunomide 20 MG tablet Commonly known as: ARAVA Take 1 tablet (20 mg total) by mouth daily.   methocarbamol 500 MG tablet Commonly known as: ROBAXIN Take 1 tablet (500 mg total) by mouth every 6 (six) hours as needed for muscle spasms.   olmesartan 20 MG tablet Commonly known as: BENICAR Take 20 mg by mouth at bedtime as needed.   Orencia ClickJect 125 MG/ML Soaj Generic drug: Abatacept INJECT 125 MG INTO THE SKIN ONCE A WEEK.  oxyCODONE-acetaminophen 5-325 MG tablet Commonly known as: PERCOCET/ROXICET Take 1-2 tablets by mouth every 4 (four) hours as needed for moderate pain or severe pain.   predniSONE 5 MG tablet Commonly known as: DELTASONE Take 5 mg by mouth in the morning and at bedtime.               Discharge Care Instructions  (From admission, onward)           Start     Ordered   08/19/21 0000  Discharge wound care:       Comments: Okay to shower. Do not apply salves or appointments to incision. No heavy lifting with the upper extremities greater than 10  pounds. May resume driving when not  requiring pain medication and patient feels comfortable with doing so.   08/19/21 0941             Signed: Shary Key Tryston Gilliam 08/19/2021, 9:41 AM

## 2021-08-19 NOTE — Evaluation (Signed)
Occupational Therapy Evaluation Patient Details Name: Gregory Hinton MRN: [id number] DOB: 28-Oct-1965 Today's Date: 08/19/2021   History of Present Illness 55 y.o. male presenting for elective L1-L2 PLIF. PMHx significant for anxiety, OA, L TKA 05/2020, Hx of spinal surgery x3, HTN and RA.   Clinical Impression   PTA patient was living with his spouse in a multi-level private residence and was independent with ADLs/IADLs without AD. Patient currently presents near baseline level of function demonstrating observes ADLs with I to supervision A. OT provided education on spinal precautions, home set-up to maximize safety and independence with self-care tasks, and acquisition/use of AE. Patient expressed verbal understanding. Reports having a reacher at home to utilize for LB dressing. Patient does not require continued acute occupational therapy services with OT to sign off at this time.         Recommendations for follow up therapy are one component of a multi-disciplinary discharge planning process, led by the attending physician.  Recommendations may be updated based on patient status, additional functional criteria and insurance authorization.   Follow Up Recommendations  No OT follow up    Assistance Recommended at Discharge PRN  Functional Status Assessment  Patient has had a recent decline in their functional status and demonstrates the ability to make significant improvements in function in a reasonable and predictable amount of time.  Equipment Recommendations  None recommended by OT    Recommendations for Other Services       Precautions / Restrictions Precautions Precautions: Back Precaution Booklet Issued: Yes (comment) Precaution Comments: Written and verbal education provided on back precautions. Good adherence during ADLs. Required Braces or Orthoses: Spinal Brace Spinal Brace: Lumbar corset;Applied in standing position Restrictions Weight Bearing Restrictions: No       Mobility Bed Mobility Overal bed mobility: Independent                  Transfers Overall transfer level: Needs assistance Equipment used: None Transfers: Sit to/from Stand Sit to Stand: Supervision           General transfer comment: Supervision A for safety.      Balance Overall balance assessment: Mild deficits observed, not formally tested                                         ADL either performed or assessed with clinical judgement   ADL Overall ADL's : Needs assistance/impaired Eating/Feeding: Independent   Grooming: Independent;Standing           Upper Body Dressing : Independent;Standing   Lower Body Dressing: Supervision/safety;Sit to/from stand   Toilet Transfer: Independent   Toileting- Architect and Hygiene: Independent       Functional mobility during ADLs: Supervision/safety       Vision Baseline Vision/History: 1 Wears glasses Ability to See in Adequate Light: 0 Adequate Patient Visual Report: No change from baseline Vision Assessment?: No apparent visual deficits     Perception     Praxis      Pertinent Vitals/Pain Pain Assessment: 0-10 Pain Score: 5  Pain Location: Low back (musculature) Pain Descriptors / Indicators: Aching;Sore;Operative site guarding Pain Intervention(s): Limited activity within patient's tolerance;Monitored during session;Premedicated before session;Repositioned     Hand Dominance     Extremity/Trunk Assessment Upper Extremity Assessment Upper Extremity Assessment: Overall WFL for tasks assessed   Lower Extremity Assessment Lower Extremity Assessment: Defer to PT evaluation  Cervical / Trunk Assessment Cervical / Trunk Assessment: Other exceptions Cervical / Trunk Exceptions: s/p lumbar surgery   Communication Communication Communication: No difficulties   Cognition Arousal/Alertness: Awake/alert Behavior During Therapy: WFL for tasks  assessed/performed Overall Cognitive Status: Within Functional Limits for tasks assessed                                       General Comments  Minimally soiled dressing at incision.    Exercises     Shoulder Instructions      Home Living Family/patient expects to be discharged to:: Private residence Living Arrangements: Spouse/significant other Available Help at Discharge: Family;Available 24 hours/day Type of Home: House Home Access: Ramped entrance     Home Layout: Multi-level;Able to live on main level with bedroom/bathroom     Bathroom Shower/Tub: Walk-in shower;Tub/shower unit   Bathroom Toilet: Standard     Home Equipment: Agricultural consultant (2 wheels);BSC/3in1;Adaptive equipment Adaptive Equipment: Reacher Additional Comments: Has been sleeping in a recliner x3 months      Prior Functioning/Environment Prior Level of Function : Independent/Modified Independent;Driving                        OT Problem List: Pain      OT Treatment/Interventions:      OT Goals(Current goals can be found in the care plan section) Acute Rehab OT Goals Patient Stated Goal: To return home. OT Goal Formulation: With patient  OT Frequency:     Barriers to D/C:            Co-evaluation              AM-PAC OT "6 Clicks" Daily Activity     Outcome Measure Help from another person eating meals?: None Help from another person taking care of personal grooming?: None Help from another person toileting, which includes using toliet, bedpan, or urinal?: None Help from another person bathing (including washing, rinsing, drying)?: A Little Help from another person to put on and taking off regular upper body clothing?: None Help from another person to put on and taking off regular lower body clothing?: A Little 6 Click Score: 22   End of Session Equipment Utilized During Treatment: Back brace Nurse Communication: Mobility status  Activity Tolerance:  Patient tolerated treatment well Patient left: in chair;with call bell/phone within reach  OT Visit Diagnosis: Pain Pain - part of body:  (low back)                Time: 1610-9604 OT Time Calculation (min): 21 min Charges:  OT General Charges $OT Visit: 1 Visit OT Evaluation $OT Eval Low Complexity: 1 Low  Aveon Colquhoun H. OTR/L Supplemental OT, Department of rehab services [phone]  Leonora Gores R H. 08/19/2021, 8:07 AM

## 2021-08-19 NOTE — Anesthesia Postprocedure Evaluation (Signed)
Anesthesia Post Note  Patient: Gregory Hinton  Procedure(s) Performed: Lumbar One-Two Posterior lumbar interbody fusion with posterior fixation from Lumbar One to Lumbar Three (Back)     Patient location during evaluation: PACU Anesthesia Type: General Level of consciousness: sedated and patient cooperative Pain management: pain level controlled Vital Signs Assessment: post-procedure vital signs reviewed and stable Respiratory status: spontaneous breathing Cardiovascular status: stable Anesthetic complications: no   No notable events documented.  Last Vitals:  Vitals:   08/19/21 0748 08/19/21 1149  BP: (!) 142/88 106/60  Pulse: (!) 107 86  Resp: 18 18  Temp: 36.7 C 36.7 C  SpO2: 97% 98%    Last Pain:  Vitals:   08/19/21 1149  TempSrc: Oral  PainSc:                  [name]

## 2021-08-19 NOTE — Evaluation (Signed)
Physical Therapy Evaluation and Discharge Patient Details Name: Gregory Hinton MRN: [id number] DOB: May 23, 1966 Today's Date: 08/19/2021  History of Present Illness  Pt is a 55 y/o male presenting for elective L1-L2 PLIF on 08/18/2021. PMHx significant for anxiety, OA, L TKA 05/2020, Hx of spinal surgery x3, HTN and RA.   Clinical Impression  Patient evaluated by Physical Therapy with no further acute PT needs identified. All education has been completed and the patient has no further questions. Pt was able to demonstrate transfers and ambulation with gross modified independence and no AD. Pt was educated on precautions, brace application/wearing schedule, appropriate activity progression, and car transfer. See below for any follow-up Physical Therapy or equipment needs. PT is signing off. Thank you for this referral.        Recommendations for follow up therapy are one component of a multi-disciplinary discharge planning process, led by the attending physician.  Recommendations may be updated based on patient status, additional functional criteria and insurance authorization.  Follow Up Recommendations Outpatient PT (For work conditioning prior to return to work. MD to order when appropriate per post-op protocol.)    Assistance Recommended at Discharge PRN  Functional Status Assessment Patient has had a recent decline in their functional status and demonstrates the ability to make significant improvements in function in a reasonable and predictable amount of time.  Equipment Recommendations  None recommended by PT    Recommendations for Other Services       Precautions / Restrictions Precautions Precautions: Back Precaution Booklet Issued: Yes (comment) Precaution Comments: Reviewed handout and pt was cued for precautions during functional mobility. Required Braces or Orthoses: Spinal Brace Spinal Brace: Lumbar corset;Applied in standing position (Due to body  habitus) Restrictions Weight Bearing Restrictions: No      Mobility  Bed Mobility Overal bed mobility: Modified Independent             General bed mobility comments: HOB flat and rails lowered to simulate home environment. No assist required but increased time taken due to pain.    Transfers Overall transfer level: Modified independent Equipment used: None Transfers: Sit to/from Stand Sit to Stand: Supervision           General transfer comment: No assist. Pt demonstrated proper hand placement on seated surface for safety.    Ambulation/Gait Ambulation/Gait assistance: Modified independent (Device/Increase time) Gait Distance (Feet): 400 Feet Assistive device: None Gait Pattern/deviations: Step-through pattern;Decreased stride length;Drifts right/left;Steppage Gait velocity: Decreased Gait velocity interpretation: 1.31 - 2.62 ft/sec, indicative of limited community ambulator   General Gait Details: Pt guarded due to pain. Drifting R and L throughout hallway ambulation however no overt LOB noted.  Stairs            Wheelchair Mobility    Modified Rankin (Stroke Patients Only)       Balance Overall balance assessment: Mild deficits observed, not formally tested                                           Pertinent Vitals/Pain Pain Assessment: Faces Pain Score: 5  Faces Pain Scale: Hurts even more Pain Location: Low back (musculature) Pain Descriptors / Indicators: Aching;Sore;Operative site guarding Pain Intervention(s): Limited activity within patient's tolerance;Monitored during session;Repositioned;Patient requesting pain meds-RN notified    Home Living Family/patient expects to be discharged to:: Private residence Living Arrangements: Spouse/significant other Available Help at Discharge: Family;Available  24 hours/day Type of Home: House Home Access: Ramped entrance       Home Layout: Multi-level;Able to live on main level  with bedroom/bathroom Home Equipment: Rolling Walker (2 wheels);BSC/3in1;Adaptive equipment Additional Comments: Has been sleeping in a recliner x3 months; has wedge pillows for the bed    Prior Function Prior Level of Function : Independent/Modified Independent;Driving;Working/employed             Mobility Comments: Pt self employed       Higher education careers adviser   Dominant Hand: Right    Extremity/Trunk Assessment   Upper Extremity Assessment Upper Extremity Assessment: Defer to OT evaluation    Lower Extremity Assessment Lower Extremity Assessment: Generalized weakness (Consistent with pre-op diagnosis)    Cervical / Trunk Assessment Cervical / Trunk Assessment: Back Surgery Cervical / Trunk Exceptions: s/p lumbar surgery  Communication   Communication: No difficulties  Cognition Arousal/Alertness: Awake/alert Behavior During Therapy: WFL for tasks assessed/performed Overall Cognitive Status: Within Functional Limits for tasks assessed                                          General Comments General comments (skin integrity, edema, etc.): Minimally soiled dressing at incision.    Exercises     Assessment/Plan    PT Assessment Patient does not need any further PT services  PT Problem List         PT Treatment Interventions      PT Goals (Current goals can be found in the Care Plan section)  Acute Rehab PT Goals Patient Stated Goal: Home today; return to work PT Goal Formulation: All assessment and education complete, DC therapy    Frequency     Barriers to discharge        Co-evaluation               AM-PAC PT "6 Clicks" Mobility  Outcome Measure Help needed turning from your back to your side while in a flat bed without using bedrails?: None Help needed moving from lying on your back to sitting on the side of a flat bed without using bedrails?: None Help needed moving to and from a bed to a chair (including a wheelchair)?:  None Help needed standing up from a chair using your arms (e.g., wheelchair or bedside chair)?: None Help needed to walk in hospital room?: None Help needed climbing 3-5 steps with a railing? : A Little 6 Click Score: 23    End of Session Equipment Utilized During Treatment: Gait belt;Back brace Activity Tolerance: Patient tolerated treatment well Patient left: in bed;with call bell/phone within reach Nurse Communication: Mobility status;Patient requests pain meds PT Visit Diagnosis: Unsteadiness on feet (R26.81);Pain Pain - part of body:  (back)    Time: 6294-7654 PT Time Calculation (min) (ACUTE ONLY): 15 min   Charges:   PT Evaluation $PT Eval Low Complexity: 1 Low          Conni Slipper, PT, DPT Acute Rehabilitation Services Pager: [phone] Office: [phone]   Marylynn Pearson 08/19/2021, 9:13 AM

## 2021-08-19 NOTE — Progress Notes (Signed)
Patient awaiting transport to his vehicle via wheelchair by volunteer for discharge home; in no acute distress nor complaints of pain nor discomfort; incision on his back with honeycomb dressing and is clean, dry and intact; room was checked and accounted for all his belongings; discharge instructions concerning medications, wound care, follow up appointment and when to call the doctor were all discussed with patient and he expressed understanding on the instructions given.

## 2021-08-19 NOTE — Plan of Care (Signed)

## 2021-11-05 HISTORY — PX: CARDIAC CATHETERIZATION: SHX172

## 2022-06-12 ENCOUNTER — Other Ambulatory Visit: Payer: Self-pay | Admitting: Neurological Surgery

## 2022-06-12 DIAGNOSIS — G959 Disease of spinal cord, unspecified: Secondary | ICD-10-CM

## 2022-06-28 ENCOUNTER — Ambulatory Visit
Admission: RE | Admit: 2022-06-28 | Discharge: 2022-06-28 | Disposition: A | Discharge status: Home / Self Care | Payer: BC Managed Care – PPO | Source: Ambulatory Visit | Attending: Neurological Surgery | Admitting: Neurological Surgery

## 2022-06-28 DIAGNOSIS — G959 Disease of spinal cord, unspecified: Secondary | ICD-10-CM

## 2022-08-03 ENCOUNTER — Ambulatory Visit (INDEPENDENT_AMBULATORY_CARE_PROVIDER_SITE_OTHER): Payer: BC Managed Care – PPO | Admitting: Neurology

## 2022-08-03 ENCOUNTER — Encounter: Payer: Self-pay | Admitting: Neurology

## 2022-08-03 VITALS — BP 140/81 | HR 78 | Ht 76.0 in | Wt 289.0 lb

## 2022-08-03 DIAGNOSIS — R531 Weakness: Secondary | ICD-10-CM | POA: Diagnosis not present

## 2022-08-03 DIAGNOSIS — R269 Unspecified abnormalities of gait and mobility: Secondary | ICD-10-CM | POA: Diagnosis not present

## 2022-08-03 DIAGNOSIS — R202 Paresthesia of skin: Secondary | ICD-10-CM | POA: Diagnosis not present

## 2022-08-03 NOTE — Progress Notes (Signed)
Chief Complaint  Patient presents with   New Patient (Initial Visit)    Rm 14 alone. Reports he is here to discuss worsening myopathy sx most troublesome areas are bilateral feet.       ASSESSMENT AND PLAN  Gregory Hinton is a 56 y.o. male   Progressive worsening bilateral upper and lower extremity weakness, intermittent double vision since spring 2023,  Examination showed mild eye closure,, cheek puff weakness, mild neck flexion, proximal upper and lower extremity weakness, distal leg weakness, sensory loss,  Need to rule out intrinsic muscle disease neuromuscular junctional disorder, distal sensorimotor deficit could be from residual lumbar radiculopathy  EMG nerve conduction study  Laboratory evaluations   DIAGNOSTIC DATA (LABS, IMAGING, TESTING) - I reviewed patient records, labs, notes, testing and imaging myself where available.   MEDICAL HISTORY:  Gregory Hinton is a 56 year old male, seen in request by neurosurgeon neurosurgeon Dr. Kristeen Miss, for evaluation of gradual onset arm and leg weakness, his primary care is at [name], [name], initial evaluation August 03, 2022  I reviewed and summarized the referring note. PMHX. Adrenal insufficiency HTN Anxiety Rheumatoid arthritis Lumbar decompression x3, last was in 2022,  CTS release bilateral Left ulnar decompression in 2017.  Patient was diagnosed with adrenal insufficiency around 2016," felt terrible" then, lack of energy, laboratory evaluation confirmed adrenal insufficiency, he has been treated with prednisone 5 to 10 mg daily, does make a difference, currently taking 5 mg twice a day  Also had longstanding history of rheumatoid arthritis, multiple joints pain, moderate to severe bilateral shoulder pain, multiple hand joints pain,   He works as a Sports administrator, including heavy lifting, working on [employer], operational  Since January 2023, he noticed bilateral foot numbness tingling, as if  walking on cottons, gradually getting worse, around spring 2023, he also noticed bilateral arm and leg weakness, need push-up to get up from seated position, hard to get around in his workshop, fell few times,  He had 3 lumbar decompression surgery in the past, personally reviewed MRI lumbar spine September 2022, interval L2-3 fusion with moderate residual spinal stenosis, new adjacent segment disease at L1-2 with severe spinal stenosis and moderate neuroforaminal stenosis  He since underwent posterior decompression L1 to with laminectomy and posterior lumbar interbody arthrodesis, segmental fixation L1-4, by Dr. Ellene Route August 18, 2021  His low back pain has improved, current upper and lower extremity paresthesia, weakness new symptoms since spring 2023 following his recovery from most recent lumbar decompression surgery in November 2022  He also noticed mild intermittent blurry vision especially at the end of the day, denies swallowing difficulty,  PHYSICAL EXAM:   Vitals:   08/03/22 1531  BP: (!) 140/81  Pulse: 78  Weight: 289 lb (131.1 kg)  Height: 6\' 4"  (1.93 m)   Not recorded     Body mass index is 35.18 kg/m.  PHYSICAL EXAMNIATION:  Gen: NAD, conversant, well nourised, well groomed                     Cardiovascular: Regular rate rhythm, no peripheral edema, warm, nontender. Eyes: Conjunctivae clear without exudates or hemorrhage Neck: Supple, no carotid bruits. Pulmonary: Clear to auscultation bilaterally   NEUROLOGICAL EXAM:  MENTAL STATUS: Speech/cognition: Awake, alert, oriented to history taking and casual conversation CRANIAL NERVES: CN II: Visual fields are full to confrontation. Pupils are round equal and briskly reactive to light. CN III, IV, VI: extraocular movement are normal. No ptosis.  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 CN IX, X: Phonation is normal. CN XI: Head turning and shoulder shrug are intact  MOTOR:  Mild neck flexion weakness, upper extremity examination is limited by moderate to severe bilateral shoulder pain, felt to have mild bilateral shoulder abduction, external rotation weakness,  Mild to moderate bilateral hip flexion weakness, ankle dorsiflexion weakness, toe flexion extension weakness, right worse than left,  REFLEXES: Reflexes are 2+ and symmetric at the biceps, triceps, knees, and absent at ankles. Plantar responses are flexor.  SENSORY: Length-dependent decreased light touch, pinprick to the mid shin level  COORDINATION: There is no trunk or limb dysmetria noted.  GAIT/STANCE: Barely get up from seated position arm crossed, wide-based, unsteady, difficulty standing up on tiptoes or heels,  REVIEW OF SYSTEMS:  Full 14 system review of systems performed and notable only for as above All other review of systems were negative.   ALLERGIES: Allergies  Allergen Reactions   Eggs Or Egg-Derived Products Diarrhea and Nausea And Vomiting    HOME MEDICATIONS: Current Outpatient Medications  Medication Sig Dispense Refill   Abatacept (ORENCIA IV) Inject into the vein. On hold for the last two months     albuterol (VENTOLIN HFA) 108 (90 Base) MCG/ACT inhaler Inhale 2 puffs into the lungs every 6 (six) hours as needed for wheezing or shortness of breath. 8 g 6   atenolol (TENORMIN) 25 MG tablet Take 25 mg by mouth daily.     buPROPion (WELLBUTRIN XL) 300 MG 24 hr tablet Take 150 mg by mouth daily.     cholecalciferol (VITAMIN D) 1000 units tablet Take 1,000 Units by mouth daily.      furosemide (LASIX) 20 MG tablet Take 20 mg by mouth 2 (two) times daily.     ibuprofen (ADVIL,MOTRIN) 200 MG tablet Take 800 mg by mouth every 8 (eight) hours as needed for mild pain or moderate pain.      olmesartan  (BENICAR) 20 MG tablet Take 20 mg by mouth at bedtime as needed.     predniSONE (DELTASONE) 5 MG tablet Take 5 mg by mouth in the morning and at bedtime.     No current facility-administered medications for this visit.    PAST MEDICAL HISTORY: Past Medical History:  Diagnosis Date   Adrenal insufficiency ([place])    Anemia    Anxiety    Arthritis    Back pain    had 3 fusions surgeries   H/O cardiovascular stress test 2014   admitted at [facility]. overnight for chest pain, stress/echo test wnl   Hypertension    PONV (postoperative nausea and vomiting)    Rheumatoid arthritis ([place])    manages by Dr. Estanislado Pandy, rec'd Orencia weekly for 2 months, switched from Humira     PAST SURGICAL HISTORY: Past Surgical History:  Procedure Laterality Date   ANTERIOR LAT LUMBAR FUSION Left 12/26/2018   Procedure: Left Lumbar Two-Three Anterolateral decompression/interbody fusion/lateral plate;  Surgeon: Kristeen Miss, MD;  Location: [facility];  Service: Neurosurgery;  Laterality: Left;  Anterolateral   BACK SURGERY  [phone]   lumbar fusion 3 times   CARPAL TUNNEL RELEASE Right 2000   and left side 2 years ago with left ulnar surgery   COLONOSCOPY     HERNIA REPAIR Right 2001   inguinal  REPLACEMENT TOTAL KNEE Left 05/2020    FAMILY HISTORY: Family History  Problem Relation Age of Onset   Aneurysm Mother    Leukemia Father    Healthy Daughter    Healthy Son     SOCIAL HISTORY: Social History   Socioeconomic History   Marital status: Married    Spouse name: Not on file   Number of children: Not on file   Years of education: Not on file   Highest education level: Not on file  Occupational History   Not on file  Tobacco Use   Smoking status: Former    Packs/day: 0.25    Years: 5.00    Total pack years: 1.25    Types: Cigarettes    Quit date: 02/18/1998    Years since quitting: 24.4   Smokeless tobacco: Never  Vaping Use   Vaping Use: Never used  Substance and Sexual Activity    Alcohol use: Yes    Comment: rarely   Drug use: No   Sexual activity: Not on file  Other Topics Concern   Not on file  Social History Narrative   Not on file   Social Determinants of Health   Financial Resource Strain: Not on file  Food Insecurity: Not on file  Transportation Needs: Not on file  Physical Activity: Not on file  Stress: Not on file  Social Connections: Not on file  Intimate Partner Violence: Not on file      Marcial Pacas, M.D. Ph.D.  [facility] [address] [place], [place] [address] Ph: [phone] Fax: [phone]  CC:  Kristeen Miss, MD [address],   [address]  [place], [name], [place]

## 2022-08-10 ENCOUNTER — Telehealth: Payer: Self-pay

## 2022-08-10 NOTE — Telephone Encounter (Signed)
I called the pt relayed appt time/date. He accepted and has been placed on the schedule.

## 2022-08-10 NOTE — Telephone Encounter (Signed)
Received message to monitor schedule in hope to get pt scheduled for a sooner NCS/EMG.  I have reviewed the schedule for a week no sooner appts have be come avail.  He is scheduled for 10/28/2022 at present.

## 2022-08-10 NOTE — Telephone Encounter (Signed)
Need to be done sooner, add as 4th NCS for Nov 10th, this coming Friday

## 2022-08-14 ENCOUNTER — Ambulatory Visit (INDEPENDENT_AMBULATORY_CARE_PROVIDER_SITE_OTHER): Payer: BC Managed Care – PPO | Admitting: Neurology

## 2022-08-14 ENCOUNTER — Ambulatory Visit: Payer: BC Managed Care – PPO | Admitting: Neurology

## 2022-08-14 VITALS — BP 118/65 | HR 71

## 2022-08-14 DIAGNOSIS — R531 Weakness: Secondary | ICD-10-CM | POA: Diagnosis not present

## 2022-08-14 DIAGNOSIS — R269 Unspecified abnormalities of gait and mobility: Secondary | ICD-10-CM

## 2022-08-14 DIAGNOSIS — R202 Paresthesia of skin: Secondary | ICD-10-CM

## 2022-08-14 DIAGNOSIS — G629 Polyneuropathy, unspecified: Secondary | ICD-10-CM | POA: Diagnosis not present

## 2022-08-14 MED ORDER — PREDNISONE 20 MG PO TABS: 60.0000 mg | ORAL_TABLET | Freq: Every day | ORAL | 3 refills | Status: DC

## 2022-08-14 MED ORDER — DULOXETINE HCL 60 MG PO CPEP: 60.0000 mg | ORAL_CAPSULE | Freq: Every day | ORAL | 11 refills | Status: DC

## 2022-08-14 NOTE — Patient Instructions (Signed)
Prednisone 20mg    3 tabs every morning after breakfast x4 weeks. Or call for improvement. 2.5 tabs x4 weeks 2 tabs x 4 weeks.

## 2022-08-14 NOTE — Progress Notes (Unsigned)
ASSESSMENT AND PLAN  Gregory Hinton is a 56 y.o. 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 MRI of thoracic spine September 2023, no significant canal foraminal narrowing MRI of cervical spine, mild degenerative changes, no evidence of canal stenosis, variable degree of foraminal narrowing  MEDICAL HISTORY:  Gregory Hinton is a 56 year old male, seen in request by neurosurgeon neurosurgeon Dr. Kristeen Miss, for evaluation of gradual onset arm and leg weakness, his primary care is at [facility], [name], initial evaluation August 03, 2022  I reviewed and summarized the referring note. PMHX. Adrenal insufficiency HTN Anxiety Rheumatoid arthritis Lumbar decompression x3, last was in 2022,  CTS release bilateral Left ulnar decompression in 2017.  Patient was diagnosed with adrenal insufficiency around 2016," felt terrible" then, lack of energy, laboratory evaluation confirmed adrenal insufficiency, he has been treated with prednisone 5 to 10 mg daily, does make a difference, currently taking 5 mg twice a day  Also had longstanding history of rheumatoid arthritis, multiple joints pain, moderate to severe bilateral shoulder pain, multiple hand joints pain,   He works as a Sports administrator, including heavy lifting, working on [employer], operational  Since January 2023, he noticed bilateral foot  numbness tingling, as if walking on cottons, gradually getting worse, around spring 2023, he also noticed bilateral arm and leg weakness, need push-up to get up from seated position, hard to get around in his workshop, fell few times,  He had 3 lumbar decompression surgery in the past, personally reviewed MRI lumbar spine September 2022, interval L2-3 fusion with moderate residual spinal stenosis, new adjacent segment disease at L1-2 with severe spinal stenosis and moderate neuroforaminal stenosis  He since underwent posterior decompression L1 to with laminectomy and posterior lumbar interbody arthrodesis, segmental fixation L1-4, by Dr. 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,  Patient complains of slow onset, progressive worsening gait abnormality over past 1 year, to the point of at high risk for fall, difficult to get around at his workshop, also complains of deep body achy pain  Reviewed extensive laboratory evaluation October 2023, normal negative ESR C-reactive protein B12, RPR, HIV, ANA, thyroid panel, acetylcholine receptor antibody, protein electrophoresis,  Lipid panel LDL 98, A1c 5.7    PHYSICAL EXAM:    118/65, 71  PHYSICAL EXAMNIATION:  Gen: NAD, conversant, well nourised, well groomed                     Cardiovascular: Regular rate rhythm, no peripheral edema, warm, nontender. Eyes: Conjunctivae clear without exudates or hemorrhage Neck: Supple, no  carotid bruits. Pulmonary: Clear to auscultation bilaterally    NEUROLOGICAL EXAM:  MENTAL STATUS: Speech/cognition: Awake, alert, oriented to history taking and casual conversation CRANIAL NERVES: CN II: Visual fields are full to confrontation. Pupils are round equal and briskly reactive to light. CN III, IV, VI: extraocular movement are normal. No ptosis.  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 CN IX, X: Phonation is normal. CN XI: Head turning and shoulder shrug are intact  MOTOR:  No neck flexion weakness,  mild bilateral shoulder abduction, external rotation weakness,  Mild  bilateral hip flexion weakness, ankle dorsiflexion weakness, toe flexion extension weakness, right worse than left,  REFLEXES: Areflexia  SENSORY: Length-dependent decreased light touch, pinprick to the mid shin level  COORDINATION: There is no trunk or limb dysmetria noted.  GAIT/STANCE: Need to push-up to get up from seated position, wide-based, unsteady, could not stand up on tiptoes, heels, positive Romberg signs  REVIEW OF SYSTEMS:  Full 14 system review of systems performed and notable only for as above All other review of systems were negative.   ALLERGIES: Allergies  Allergen Reactions   Eggs Or Egg-Derived Products Diarrhea and Nausea And Vomiting    HOME MEDICATIONS: Current Outpatient Medications  Medication Sig Dispense Refill   Abatacept (ORENCIA IV) Inject into the vein. On hold for the last two months     albuterol (VENTOLIN HFA) 108 (90 Base) MCG/ACT inhaler Inhale 2 puffs into the lungs every 6 (six) hours as needed for wheezing or shortness of breath. 8 g 6   atenolol (TENORMIN) 25 MG tablet Take 25 mg by mouth daily.     buPROPion (WELLBUTRIN XL) 150 MG 24 hr tablet Take 150 mg by mouth daily.     cholecalciferol  (VITAMIN D) 1000 units tablet Take 1,000 Units by mouth daily.      furosemide (LASIX) 20 MG tablet Take 20 mg by mouth 2 (two) times daily.     ibuprofen (ADVIL,MOTRIN) 200 MG tablet Take 800 mg by mouth every 8 (eight) hours as needed for mild pain or moderate pain.      olmesartan (BENICAR) 20 MG tablet Take 20 mg by mouth at bedtime as needed.     predniSONE (DELTASONE) 5 MG tablet Take 5 mg by mouth in the morning and at bedtime.     No current facility-administered medications for this visit.    PAST MEDICAL HISTORY: Past Medical History:  Diagnosis Date   Adrenal insufficiency ([place])    Anemia    Anxiety    Arthritis    Back pain    had 3 fusions surgeries   H/O cardiovascular stress test 2014   admitted at [facility]. overnight for chest pain, stress/echo test wnl   Hypertension    PONV (postoperative nausea and vomiting)    Rheumatoid arthritis ([place])    manages by Dr. Estanislado Pandy, rec'd Orencia weekly for 2 months, switched from Humira     PAST SURGICAL HISTORY: Past Surgical History:  Procedure Laterality Date   ANTERIOR LAT LUMBAR FUSION Left 12/26/2018   Procedure: Left Lumbar Two-Three Anterolateral decompression/interbody fusion/lateral plate;  Surgeon: Kristeen Miss, MD;  Location: [facility];  Service: Neurosurgery;  Laterality: Left;  Anterolateral   BACK SURGERY  [id number]   lumbar fusion 3 times   CARPAL TUNNEL RELEASE Right 2000  and left side 2 years ago with left ulnar surgery   COLONOSCOPY     HERNIA REPAIR Right 2001   inguinal    REPLACEMENT TOTAL KNEE Left 05/2020    FAMILY HISTORY: Family History  Problem Relation Age of Onset   Aneurysm Mother    Leukemia Father    Healthy Daughter    Healthy Son     SOCIAL HISTORY: Social History   Socioeconomic History   Marital status: Married    Spouse name: Not on file   Number of children: Not on file   Years of education: Not on file   Highest education level: Not on file  Occupational History   Not on  file  Tobacco Use   Smoking status: Former    Packs/day: 0.25    Years: 5.00    Total pack years: 1.25    Types: Cigarettes    Quit date: 02/18/1998    Years since quitting: 24.5   Smokeless tobacco: Never  Vaping Use   Vaping Use: Never used  Substance and Sexual Activity   Alcohol use: Yes    Comment: rarely   Drug use: No   Sexual activity: Not on file  Other Topics Concern   Not on file  Social History Narrative   Not on file   Social Determinants of Health   Financial Resource Strain: Not on file  Food Insecurity: Not on file  Transportation Needs: Not on file  Physical Activity: Not on file  Stress: Not on file  Social Connections: Not on file  Intimate Partner Violence: Not on file      Marcial Pacas, M.D. Ph.D.  [facility] [address] [place], [place] [address] Ph: [phone] Fax: [phone]  CC:  Maggie Schwalbe, PA-C [place],  [place] [address]  [facility], [name], [place]

## 2022-08-16 ENCOUNTER — Telehealth: Payer: Self-pay | Admitting: Neurology

## 2022-08-16 NOTE — Telephone Encounter (Signed)
Please complete intra fusion IVIG preauthorization, 2 g/kg as loading dose, followed by 1 g every 3 weeks

## 2022-08-16 NOTE — Procedures (Signed)
Full Name: Gregory Hinton Gender: Male MRN #: [id number] Date of Birth: [birth year]    Visit Date: 08/14/2022 12:36 Age: 56 Years Examining Physician: Knute Neu Referring Physician: Levert Feinstein Height: 6 feet 1 inch History: 56 year old male with progressive upper and lower extremity weakness, numbness, worsening gait abnormality  Summary of the test:  Nerve conduction study:  Left sural sensory response was absent.  Left superficial peroneal sensory response showed mildly decreased snap amplitude.  Left median, ulnar, radial sensory responses showed prolonged peak latency with mildly to moderately decreased snap amplitude.  Left ulnar motor responses showed borderline prolonged distal latency, mildly decreased CMAP amplitude, 11 m/s velocity drop across left elbow.  Significantly prolonged left ulnar F-wave latency.  Left tibial motor responses also showed significantly decreased CMAP amplitude and F-wave latency  Left peroneal to EDB motor responses showed mildly decreased CMAP amplitude, moderately prolonged distal latency, and moderate slow conduction velocity,  Left median motor responses showed moderately prolonged distal latency, within normal range CMAP amplitude.  Electromyography:  Selected needle examinations of bilateral lower extremity muscles, lumbosacral paraspinal; left upper extremity and left cervical paraspinal muscles was performed  There is evidence of active neuropathic changes involving bilateral lower extremity muscles, noticeable at bilateral L3-4-5 myotomes.  There is no evidence of active denervation at bilateral lumbosacral paraspinal muscles.  There is also evidence of chronic neuropathic changes involving the left C5-6-7 myotomes.  There is no evidence of spontaneous activity at left cervical paraspinal muscles.  Conclusion: This is an abnormal study.  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  There is also evidence of moderately severe left carpal tunnel syndromes.    ------------------------------- Levert Feinstein M.D. Ph.D.  [facility] [address], [address] [place], [place] [address] Tel: [phone] Fax: [phone]  Verbal informed consent was obtained from the patient, patient was informed of potential risk of procedure, including bruising, bleeding, hematoma formation, infection, muscle weakness, muscle pain, numbness, among others.        MNC    Nerve / Sites Muscle Latency Ref. Amplitude Ref. Rel Amp Segments Distance Velocity Ref. Area    ms ms mV mV %  cm m/s m/s mVms  L Median - APB     Wrist APB 5.1 ?4.4 5.6 ?4.0 100 Wrist - APB 7   21.2     Upper arm APB 11.0  5.4  95.6 Upper arm - Wrist 29 49 ?49 20.4  L Ulnar - ADM     Wrist ADM 3.3 ?3.3 5.5 ?6.0 100 Wrist - ADM 7   19.0     B.Elbow ADM 6.7  5.6  102 B.Elbow - Wrist 19 56 ?49 20.7     A.Elbow ADM 10.2  5.2  92.5 A.Elbow - B.Elbow 16 45 ?49 21.0  L Peroneal - EDB     Ankle EDB 6.2 ?6.5 1.5 ?2.0 100 Ankle - EDB 9   3.8     Fib head EDB 17.6  0.7  48.9 Fib head - Ankle 34 30 ?44 1.6     Pop fossa EDB 21.8  0.7  100 Pop fossa - Fib head 16 37 ?44 1.6         Pop fossa - Ankle      L Tibial - AH  Ankle AH 5.4 ?5.8 0.6 ?4.0 100 Ankle - AH 9   1.6     Pop fossa AH      Pop fossa - Ankle   ?41              SNC    Nerve / Sites Rec. Site Peak Lat Ref.  Amp Ref. Segments Distance    ms ms V V  cm  L Radial - Anatomical snuff box (Forearm)     Forearm Wrist 3.4 ?2.9 7 ?15 Forearm - Wrist 10  L Sural - Ankle (Calf)     Calf Ankle NR ?4.4 NR ?6 Calf - Ankle 14  L Superficial peroneal - Ankle     Lat leg Ankle 4.1 ?4.4 5 ?6 Lat leg - Ankle 14  L Median - Orthodromic (Dig II, Mid palm)     Dig II Wrist 4.8 ?3.4 5 ?10  Dig II - Wrist 13  L Ulnar - Orthodromic, (Dig V, Mid palm)     Dig V Wrist 3.2 ?3.1 2 ?5 Dig V - Wrist 39               F  Wave    Nerve F Lat Ref.   ms ms  L Ulnar - ADM 37.5 ?32.0  L Tibial - AH 74.8 ?56.0         EMG Summary Table    Spontaneous MUAP Recruitment  Muscle IA Fib PSW Fasc Other Amp Dur. Poly Pattern  L. Tibialis posterior Increased None None None _______ Increased Increased 1+ Reduced  L. Tibialis anterior Increased None None None _______ Increased Increased 1+ Reduced  L. Peroneus longus Increased None None None _______ Increased Increased 1+ Reduced  L. Gastrocnemius (Medial head) Increased None None None _______ Increased Increased 1+ Reduced  L. Vastus lateralis Normal None None None _______ Normal Normal Normal Reduced  R. Tibialis anterior Increased None None None _______ Increased Increased 1+ Reduced  R. Tibialis posterior Increased None None None _______ Increased Increased 1+ Reduced  R. Peroneus longus Increased None None None _______ Increased Increased 1+ Reduced  R. Gastrocnemius (Medial head) Normal None None None _______ Normal Normal Normal Reduced  R. Vastus lateralis Normal None None None _______ Increased Increased 1+ Reduced  R. Lumbar paraspinals (low) Normal None None None _______ Normal Normal Normal Normal  R. Lumbar paraspinals (mid) Normal None None None _______ Normal Normal Normal Normal  L. Lumbar paraspinals (low) Normal None None None _______ Normal Normal Normal Normal  L. Lumbar paraspinals (mid) Normal None None None _______ Normal Normal Normal Normal  L. First dorsal interosseous Normal None None None _______ Normal Normal Normal Reduced  L. Biceps brachii Normal None None None _______ Increased Increased Normal Reduced  L. Deltoid Normal None None None _______ Increased Increased Normal Reduced  L. Triceps brachii Normal None None None _______ Increased Normal Normal Reduced  L. Brachioradialis Normal None None None _______  Increased Normal Normal Reduced  L. Cervical paraspinals Normal None None None _______ Normal Normal Normal Normal

## 2022-08-16 NOTE — Progress Notes (Signed)
EMG is under procedure tab 

## 2022-08-17 ENCOUNTER — Telehealth: Payer: Self-pay | Admitting: Neurology

## 2022-08-17 DIAGNOSIS — R269 Unspecified abnormalities of gait and mobility: Secondary | ICD-10-CM

## 2022-08-17 NOTE — Telephone Encounter (Signed)
I have placed IVIG order form for signature ( in MD's office).  Please specify how many days loading/subsequent dosage should be administered over as applicable.

## 2022-08-17 NOTE — Telephone Encounter (Signed)
Sent LP order to [place] at [facility], she states pt will need head MRI or CT before they can schedule LP.

## 2022-08-18 ENCOUNTER — Ambulatory Visit: Payer: BC Managed Care – PPO | Admitting: Diagnostic Neuroimaging

## 2022-08-18 LAB — RPR: RPR Ser Ql: NONREACTIVE

## 2022-08-18 LAB — ACHR ALL WITH REFLEX TO MUSK
AChR Binding Ab, Serum: <0.03 nmol/L (ref 0.00–0.24)
AChR-modulating Ab: 3 % (ref 0–45)
Acetylchol Block Ab: 20 % (ref 0–25)

## 2022-08-18 LAB — MUSK ANTIBODIES: MuSK Antibodies: <1 U/mL

## 2022-08-18 LAB — CK: Total CK: 103 U/L (ref 41–331)

## 2022-08-18 LAB — HIV ANTIBODY (ROUTINE TESTING W REFLEX): HIV Screen 4th Generation wRfx: NONREACTIVE

## 2022-08-18 LAB — MULTIPLE MYELOMA PANEL, SERUM
Albumin SerPl Elph-Mcnc: 3.5 g/dL (ref 2.9–4.4)
Albumin/Glob SerPl: 1.4 (ref 0.7–1.7)
Alpha 1: 0.2 g/dL (ref 0.0–0.4)
Alpha2 Glob SerPl Elph-Mcnc: 0.8 g/dL (ref 0.4–1.0)
B-Globulin SerPl Elph-Mcnc: 1 g/dL (ref 0.7–1.3)
Gamma Glob SerPl Elph-Mcnc: 0.5 g/dL (ref 0.4–1.8)
Globulin, Total: 2.6 g/dL (ref 2.2–3.9)
IgA/Immunoglobulin A, Serum: 153 mg/dL (ref 90–386)
IgG (Immunoglobin G), Serum: 539 mg/dL — ABNORMAL LOW (ref 603–1613)
IgM (Immunoglobulin M), Srm: 57 mg/dL (ref 20–172)
Total Protein: 6.1 g/dL (ref 6.0–8.5)

## 2022-08-18 LAB — VITAMIN B12: Vitamin B-12: 769 pg/mL (ref 232–1245)

## 2022-08-18 LAB — THYROID PANEL WITH TSH
Free Thyroxine Index: 2 (ref 1.2–4.9)
T3 Uptake Ratio: 28 % (ref 24–39)
T4, Total: 7.2 ug/dL (ref 4.5–12.0)
TSH: 1.61 u[IU]/mL (ref 0.450–4.500)

## 2022-08-18 LAB — SEDIMENTATION RATE: Sed Rate: 13 mm/hr (ref 0–30)

## 2022-08-18 LAB — ANA W/REFLEX IF POSITIVE: Anti Nuclear Antibody (ANA): NEGATIVE

## 2022-08-18 LAB — HGB A1C W/O EAG: Hgb A1c MFr Bld: 5.7 % — ABNORMAL HIGH (ref 4.8–5.6)

## 2022-08-18 LAB — C-REACTIVE PROTEIN: CRP: 6 mg/L (ref 0–10)

## 2022-08-18 NOTE — Addendum Note (Signed)
Addended by: Levert Feinstein on: 08/18/2022 03:13 PM   Modules accepted: Orders

## 2022-08-18 NOTE — Telephone Encounter (Signed)
Orders Placed This Encounter  Procedures   MR BRAIN WO CONTRAST      

## 2022-08-19 ENCOUNTER — Telehealth: Payer: Self-pay | Admitting: Neurology

## 2022-08-19 NOTE — Telephone Encounter (Signed)
[employer] Berkley Harvey: [id number] exp. 08/19/22-09/17/22 sent to GI to do before lumbar puncture. [phone]

## 2022-08-19 NOTE — Telephone Encounter (Signed)
IVIG order for loading dosage 2g/kg total over 4 days (40 grams) Subsequent dosage ofr 1 g over 2 days every 3 weeks for 12 cycles provided to intrafusion for processing.

## 2022-08-19 NOTE — Telephone Encounter (Signed)
He weight 289 Lb=131 Kg.  Loading dose 2g/kg=131x2=262 kg over 4 days  (262/4=65kg each day x4 days)  Followed by maintenance 1g/kg/ over 2 days=65kg each day x2 days every 3 weeks.

## 2022-08-20 NOTE — Telephone Encounter (Signed)
I have updated IVIG order with Selena Batten and spoke with her. She will have Dr. Terrace Arabia sign on Monday and start process for IVIG approval then.

## 2022-08-24 ENCOUNTER — Ambulatory Visit: Payer: BC Managed Care – PPO | Attending: Neurology | Admitting: Physical Therapy

## 2022-08-24 DIAGNOSIS — M6281 Muscle weakness (generalized): Secondary | ICD-10-CM | POA: Insufficient documentation

## 2022-08-24 DIAGNOSIS — R269 Unspecified abnormalities of gait and mobility: Secondary | ICD-10-CM | POA: Diagnosis not present

## 2022-08-24 DIAGNOSIS — R2681 Unsteadiness on feet: Secondary | ICD-10-CM | POA: Diagnosis present

## 2022-08-24 DIAGNOSIS — G629 Polyneuropathy, unspecified: Secondary | ICD-10-CM | POA: Insufficient documentation

## 2022-08-24 DIAGNOSIS — R531 Weakness: Secondary | ICD-10-CM | POA: Diagnosis not present

## 2022-08-24 DIAGNOSIS — R202 Paresthesia of skin: Secondary | ICD-10-CM | POA: Insufficient documentation

## 2022-08-24 DIAGNOSIS — Z9181 History of falling: Secondary | ICD-10-CM | POA: Diagnosis present

## 2022-08-24 NOTE — Therapy (Signed)
OUTPATIENT PHYSICAL THERAPY NEURO EVALUATION   Patient Name: Gregory Hinton MRN: [id number] DOB:1966/05/14, 56 y.o., male Today's Date: 08/24/2022   PCP: Leane Call, PA-C REFERRING PROVIDER: Levert Feinstein, MD  END OF SESSION:  PT End of Session - 08/24/22 1019     Visit Number 1    Number of Visits 7   Plus eval   Date for PT Re-Evaluation 10/12/22    Authorization Type BCBS    PT Start Time 1017    PT Stop Time 1050    PT Time Calculation (min) 33 min    Activity Tolerance Patient tolerated treatment well    Behavior During Therapy WFL for tasks assessed/performed             Past Medical History:  Diagnosis Date   Adrenal insufficiency (HCC)    Anemia    Anxiety    Arthritis    Back pain    had 3 fusions surgeries   H/O cardiovascular stress test 2014   admitted at hosp. overnight for chest pain, stress/echo test wnl   Hypertension    PONV (postoperative nausea and vomiting)    Rheumatoid arthritis (HCC)    manages by Dr. Corliss Skains, rec'd Orencia weekly for 2 months, switched from Humira    Past Surgical History:  Procedure Laterality Date   ANTERIOR LAT LUMBAR FUSION Left 12/26/2018   Procedure: Left Lumbar Two-Three Anterolateral decompression/interbody fusion/lateral plate;  Surgeon: Barnett Abu, MD;  Location: [facility] OR;  Service: Neurosurgery;  Laterality: Left;  Anterolateral   BACK SURGERY  [phone]   lumbar fusion 3 times   CARPAL TUNNEL RELEASE Right 2000   and left side 2 years ago with left ulnar surgery   COLONOSCOPY     HERNIA REPAIR Right 2001   inguinal    REPLACEMENT TOTAL KNEE Left 05/2020   Patient Active Problem List   Diagnosis Date Noted   Neuropathy 08/14/2022   Weakness 08/03/2022   Gait abnormality 08/03/2022   Paresthesia 08/03/2022   Pneumonia due to COVID-19 virus 10/08/2020   High risk medication use 04/19/2018   History of diverticulitis 05/04/2017   History of colon polyps 05/04/2017   Primary osteoarthritis  of both hands 05/04/2017   Primary osteoarthritis of both feet 05/04/2017   Primary osteoarthritis of both knees 04/02/2017   Class 1 obesity without serious comorbidity with body mass index (BMI) of 32.0 to 32.9 in adult 10/14/2016   Spondylosis of lumbar region without myelopathy or radiculopathy 10/14/2016   DJD (degenerative joint disease), cervical 10/14/2016   High risk medications (not anticoagulants) long-term use 10/14/2016   Rheumatoid arthritis, seronegative, multiple sites (HCC) 10/14/2016   Lumbar stenosis with neurogenic claudication 12/17/2015    ONSET DATE: 08/14/2022  REFERRING DIAG: R53.1 (ICD-10-CM) - Weakness R26.9 (ICD-10-CM) - Gait abnormality R20.2 (ICD-10-CM) - Paresthesia G62.9 (ICD-10-CM) - Neuropathy  THERAPY DIAG:  Muscle weakness (generalized)  Unsteadiness on feet  History of falling  Rationale for Evaluation and Treatment: Rehabilitation  SUBJECTIVE:  SUBJECTIVE STATEMENT: "I have had 4 back fusion surgeries". Pt reports he had final back fusion in November 2022, starting having numbness in BLEs in January 2023. EMG showed absent "reflexes". Pt reports he has RA and is dependent on prednisone due to absent adrenal function. 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 Has had multiple falls tripping over his feet, no major injuries.    Pt accompanied by: self  PERTINENT HISTORY: anxiety, OA, L TKA 05/2020, Hx of spinal surgery x4 (fused from T12 to S1), HTN and RA  PAIN:  Are you having pain? Yes: NPRS scale: 4/10 Pain location: B shoulder and B hands Pain description: Throb Pt states his BLEs are numb today but often has nerve pain in legs as well   PRECAUTIONS: Fall  WEIGHT BEARING RESTRICTIONS: No  FALLS: Has patient fallen in last 6 months? Yes.  Number of falls at least 3   LIVING ENVIRONMENT: Lives with: lives with their spouse Lives in: House/apartment Stairs: No Has following equipment at home: Ramped entry  PLOF: Independent  PATIENT GOALS: "just getting better at balance"   OBJECTIVE:   DIAGNOSTIC FINDINGS: Nerve conduction study on 08/14/22:   Left sural sensory response was absent.  Left superficial peroneal sensory response showed mildly decreased snap amplitude.   Left median, ulnar, radial sensory responses showed prolonged peak latency with mildly to moderately decreased snap amplitude.   Left ulnar motor responses showed borderline prolonged distal latency, mildly decreased CMAP amplitude, 11 m/s velocity drop across left elbow.  Significantly prolonged left ulnar F-wave latency.   Left tibial motor responses also showed significantly decreased CMAP amplitude and F-wave latency   Left peroneal to EDB motor responses showed mildly decreased CMAP amplitude, moderately prolonged distal latency, and moderate slow conduction velocity,   Left median motor responses showed moderately prolonged distal latency, within normal range CMAP amplitude.  COGNITION: Overall cognitive status: Within functional limits for tasks assessed   SENSATION: Tested bilaterally  Light touch: Impaired  Hot/Cold: Impaired  Proprioception: Impaired   COORDINATION: Heel to shin test: Pt unable to fully perform bilaterally due to numbness/weakness   EDEMA: Pt wears compression socks every day to assist w/BLE swelling    POSTURE: rounded shoulders and forward head  LOWER EXTREMITY MMT:  Tested in seated position  MMT Right Eval Left Eval  Hip flexion 3+ 3+  Hip extension    Hip abduction 4- 4-  Hip adduction 3+ 3+  Hip internal rotation    Hip external rotation    Knee flexion 3+ 4  Knee extension 4- 4-  Ankle dorsiflexion 2 2  Ankle plantarflexion    Ankle inversion    Ankle eversion    (Blank rows = not  tested)  BED MOBILITY:  Independent per pt, he frequently sleeps in recliner   TRANSFERS: Assistive device utilized: None  Sit to stand: SBA Heavy reliance on BUEs and very wide BOS  Stand to sit: SBA  GAIT: Gait pattern: step through pattern, decreased arm swing- Right, decreased arm swing- Left, decreased step length- Right, decreased step length- Left, decreased stride length, decreased ankle dorsiflexion- Right, decreased ankle dorsiflexion- Left, shuffling, scissoring, lateral hip instability, decreased trunk rotation, narrow BOS, poor foot clearance- Right, and poor foot clearance- Left Distance walked: Various clinic distances  Assistive device utilized:  Bilateral AFOs and None Level of assistance: SBA and CGA Comments: Pt demonstrates very unsteady gait and poor immediate standing balance, frequently running into objects and relying on furniture walking to  stabilize. Noted increased LOB episodes to R side > L side. 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   FUNCTIONAL TESTS:   [facility] PT Assessment - 08/24/22 1040       Transfers   Five time sit to stand comments  28.31s w/BUE support   RPE of 6/10     Ambulation/Gait   Gait velocity 32.8' over 12.44s = 2.63 ft/s without AD   CGA required due to lateral gait deviations and scissoring            TODAY'S TREATMENT:                                                                                                                              Next Session    PATIENT EDUCATION: Education details: POC, Eval findings, intro to AFOs  Person educated: Patient Education method: Explanation, Demonstration, and Verbal cues Education comprehension: verbalized understanding  HOME EXERCISE PROGRAM: To be established   GOALS: Goals reviewed with patient? Yes  SHORT TERM GOALS: Target date: 09/14/2022    Pt  will be independent with initial HEP for improved strength, balance, transfers and gait.  Baseline: not established on eval  Goal status: INITIAL  2.  Pt will improve 5 x STS to less than or equal to 24 seconds w/BUE support to demonstrate improved functional strength and transfer efficiency.   Baseline: 28.31s w/BUE support  Goal status: INITIAL  3.  Pt will improve gait velocity to at least 2.9 ft/s with LRAD and S* for improved gait efficiency   Baseline: 2.63 ft/s without AD and CGA Goal status: INITIAL  4.  Pt will trial various AFOs in clinic as well as ADs to determine the safest and most functional option for pt to continue to maintain independence  Baseline:  Goal status: INITIAL  5.  Berg to be assessed and LTG written  Baseline:  Goal status: INITIAL    LONG TERM GOALS: Target date: 10/05/2022   Pt will be independent with final HEP for improved strength, balance, transfers and gait.  Baseline:  Goal status: INITIAL  2.  Pt will improve 5 x STS to less than or equal to 20 seconds wBUE support to demonstrate improved functional strength and transfer efficiency.   Baseline: 28.31s w/BUE support Goal status: INITIAL  3.  Pt will obtain personal AFOs if deemed necessary for improved stability w/functional mobility, reduced fall risk and improved independence  Baseline:  Goal status: INITIAL  4.  Berg goal  Baseline:  Goal status: INITIAL  5.  Pt will improve gait velocity to at least 3.2 ft/s with LRAD mod I for improved gait efficiency   Baseline: 2.63 ft/s without AD and CGA Goal status: INITIAL    ASSESSMENT:  CLINICAL IMPRESSION: Patient is a 56 year old male referred to Neuro [facility] for  weakness and gait abnormality. Pt's PMH is significant for: anxiety, OA, L TKA 05/2020, Hx of spinal surgery x4, HTN and RA. The following deficits were present during the exam: decreased strength, impaired sensation, decreased balance and impaired righting reactions.  Based on 5x STS, gait speed and history of falls, pt is an incr risk for falls. Pt would benefit from skilled PT to address these impairments and functional limitations to maximize functional mobility independence   OBJECTIVE IMPAIRMENTS: Abnormal gait, decreased balance, decreased coordination, decreased knowledge of condition, decreased knowledge of use of DME, decreased mobility, difficulty walking, decreased strength, impaired sensation, and pain.   ACTIVITY LIMITATIONS: carrying, lifting, standing, squatting, stairs, transfers, locomotion level, and caring for others  PARTICIPATION LIMITATIONS: laundry, community activity, occupation, and yard work  PERSONAL FACTORS: Past/current experiences and 1 comorbidity: significant bilateral neuropathy  are also affecting patient's functional outcome.   REHAB POTENTIAL: Good  CLINICAL DECISION MAKING: Stable/uncomplicated  EVALUATION COMPLEXITY: Low  PLAN:  PT FREQUENCY: 1x/week  PT DURATION: 6 weeks  PLANNED INTERVENTIONS: Therapeutic exercises, Therapeutic activity, Neuromuscular re-education, Balance training, Gait training, Patient/Family education, Self Care, Joint mobilization, Stair training, Vestibular training, Canalith repositioning, Orthotic/Fit training, DME instructions, Aquatic Therapy, Dry Needling, Electrical stimulation, Spinal mobilization, Cryotherapy, Moist heat, Taping, Manual therapy, and Re-evaluation  PLAN FOR NEXT SESSION: Berg and update LTG, initial HEP for BLE strength and balance, trial AFOs    Raksha Wolfgang E Miosotis Wetsel, PT, DPT 08/24/2022, 10:57 AM

## 2022-09-02 ENCOUNTER — Encounter: Payer: Self-pay | Admitting: Physical Therapy

## 2022-09-02 ENCOUNTER — Ambulatory Visit: Payer: BC Managed Care – PPO | Admitting: Physical Therapy

## 2022-09-02 DIAGNOSIS — M6281 Muscle weakness (generalized): Secondary | ICD-10-CM | POA: Diagnosis not present

## 2022-09-02 DIAGNOSIS — Z9181 History of falling: Secondary | ICD-10-CM

## 2022-09-02 DIAGNOSIS — R2681 Unsteadiness on feet: Secondary | ICD-10-CM

## 2022-09-02 NOTE — Therapy (Signed)
OUTPATIENT PHYSICAL THERAPY NEURO TREATMENT   Patient Name: Gregory Hinton MRN: [id number] DOB:[birth year], 56 y.o., male Today's Date: 09/02/2022   PCP: Maggie Schwalbe, PA-C REFERRING PROVIDER: Marcial Pacas, MD  END OF SESSION:  PT End of Session - 09/02/22 0845     Visit Number 2    Number of Visits 7    Date for PT Re-Evaluation 10/12/22    Authorization Type BCBS    PT Start Time 0845    PT Stop Time 0925    PT Time Calculation (min) 40 min    Activity Tolerance Patient tolerated treatment well    Behavior During Therapy [facility] for tasks assessed/performed              Past Medical History:  Diagnosis Date   Adrenal insufficiency ([place])    Anemia    Anxiety    Arthritis    Back pain    had 3 fusions surgeries   H/O cardiovascular stress test 2014   admitted at [facility]. overnight for chest pain, stress/echo test wnl   Hypertension    PONV (postoperative nausea and vomiting)    Rheumatoid arthritis ([place])    manages by Dr. Estanislado Pandy, rec'd Orencia weekly for 2 months, switched from Humira    Past Surgical History:  Procedure Laterality Date   ANTERIOR LAT LUMBAR FUSION Left 12/26/2018   Procedure: Left Lumbar Two-Three Anterolateral decompression/interbody fusion/lateral plate;  Surgeon: Kristeen Miss, MD;  Location: [place];  Service: Neurosurgery;  Laterality: Left;  Anterolateral   BACK SURGERY  [phone]   lumbar fusion 3 times   CARPAL TUNNEL RELEASE Right 2000   and left side 2 years ago with left ulnar surgery   COLONOSCOPY     HERNIA REPAIR Right 2001   inguinal    REPLACEMENT TOTAL KNEE Left 05/2020   Patient Active Problem List   Diagnosis Date Noted   Neuropathy 08/14/2022   Weakness 08/03/2022   Gait abnormality 08/03/2022   Paresthesia 08/03/2022   Pneumonia due to COVID-19 virus 10/08/2020   High risk medication use 04/19/2018   History of diverticulitis 05/04/2017   History of colon polyps 05/04/2017   Primary osteoarthritis of both  hands 05/04/2017   Primary osteoarthritis of both feet 05/04/2017   Primary osteoarthritis of both knees 04/02/2017   Class 1 obesity without serious comorbidity with body mass index (BMI) of 32.0 to 32.9 in adult 10/14/2016   Spondylosis of lumbar region without myelopathy or radiculopathy 10/14/2016   DJD (degenerative joint disease), cervical 10/14/2016   High risk medications (not anticoagulants) long-term use 10/14/2016   Rheumatoid arthritis, seronegative, multiple sites ([place]) 10/14/2016   Lumbar stenosis with neurogenic claudication 12/17/2015    ONSET DATE: 08/14/2022  REFERRING DIAG: R53.1 (ICD-10-CM) - Weakness R26.9 (ICD-10-CM) - Gait abnormality R20.2 (ICD-10-CM) - Paresthesia G62.9 (ICD-10-CM) - Neuropathy  THERAPY DIAG:  Muscle weakness (generalized)  Unsteadiness on feet  History of falling  Rationale for Evaluation and Treatment: Rehabilitation  SUBJECTIVE:  SUBJECTIVE STATEMENT:  Doing well, no changes since last time. I tried some of those ugly braces, if they'll help I'm ok continuing to try these. Have a brain scan in the morning at 7am. 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    Pt accompanied by: self  PERTINENT HISTORY: anxiety, OA, L TKA 05/2020, Hx of spinal surgery x4 (fused from T12 to S1), HTN and RA  PAIN:  Are you having pain? Yes: NPRS scale: 4-5/10 Pain location: BLEs  Pain description: Throbbing, can be a shooting pain almost like a vibration when pain is high  Aggravating factors: nothing, "it is what it is"  Relieving factors: prednisone has helped hands and arms   PRECAUTIONS: Fall  WEIGHT BEARING RESTRICTIONS: No  FALLS: Has patient fallen in last 6 months? Yes. Number of falls at least 3    LIVING ENVIRONMENT: Lives with: lives with their spouse Lives in: House/apartment Stairs: No Has following equipment at home: Ramped entry  PLOF: Independent  PATIENT GOALS: "just getting better at balance"   OBJECTIVE:   DIAGNOSTIC FINDINGS: Nerve conduction study on 08/14/22:   Left sural sensory response was absent.  Left superficial peroneal sensory response showed mildly decreased snap amplitude.   Left median, ulnar, radial sensory responses showed prolonged peak latency with mildly to moderately decreased snap amplitude.   Left ulnar motor responses showed borderline prolonged distal latency, mildly decreased CMAP amplitude, 11 m/s velocity drop across left elbow.  Significantly prolonged left ulnar F-wave latency.   Left tibial motor responses also showed significantly decreased CMAP amplitude and F-wave latency   Left peroneal to EDB motor responses showed mildly decreased CMAP amplitude, moderately prolonged distal latency, and moderate slow conduction velocity,   Left median motor responses showed moderately prolonged distal latency, within normal range CMAP amplitude.  COGNITION: Overall cognitive status: Within functional limits for tasks assessed   SENSATION: Tested bilaterally  Light touch: Impaired  Hot/Cold: Impaired  Proprioception: Impaired   COORDINATION: Heel to shin test: Pt unable to fully perform bilaterally due to numbness/weakness   EDEMA: Pt wears compression socks every day to assist w/BLE swelling    POSTURE: rounded shoulders and forward head  LOWER EXTREMITY MMT:  Tested in seated position  MMT Right Eval Left Eval  Hip flexion 3+ 3+  Hip extension    Hip abduction 4- 4-  Hip adduction 3+ 3+  Hip internal rotation    Hip external rotation    Knee flexion 3+ 4  Knee extension 4- 4-  Ankle dorsiflexion 2 2  Ankle plantarflexion    Ankle inversion    Ankle eversion    (Blank rows = not tested)  BED MOBILITY:  Independent  per pt, he frequently sleeps in recliner   TRANSFERS: Assistive device utilized: None  Sit to stand: SBA Heavy reliance on BUEs and very wide BOS  Stand to sit: SBA  GAIT: Gait pattern: step through pattern, decreased arm swing- Right, decreased arm swing- Left, decreased step length- Right, decreased step length- Left, decreased stride length, decreased ankle dorsiflexion- Right, decreased ankle dorsiflexion- Left, shuffling, scissoring, lateral hip instability, decreased trunk rotation, narrow BOS, poor foot clearance- Right, and poor foot clearance- Left Distance walked: Various clinic distances  Assistive device utilized:  Bilateral AFOs and None Level of assistance: SBA and CGA Comments: Pt demonstrates very unsteady gait and poor immediate standing balance, frequently running into objects and relying on  furniture walking to stabilize. Noted increased LOB episodes to R side > L side. 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   FUNCTIONAL TESTS:   [facility] PT Assessment - 09/02/22 0001       Standardized Balance Assessment   Standardized Balance Assessment Berg Balance Test      Berg Balance Test   Sit to Stand Able to stand without using hands and stabilize independently    Standing Unsupported Able to stand safely 2 minutes    Sitting with Back Unsupported but Feet Supported on Floor or Stool Able to sit safely and securely 2 minutes    Stand to Sit Uses backs of legs against chair to control descent    Transfers Able to transfer safely, minor use of hands    Standing Unsupported with Eyes Closed Able to stand 10 seconds with supervision    Standing Unsupported with Feet Together Able to place feet together independently but unable to hold for 30 seconds    From Standing, Reach Forward with Outstretched Arm Reaches forward but needs supervision    From Standing  Position, Pick up Object from Floor Unable to try/needs assist to keep balance   has to kneel on floor, unable to bend over and pick up item   From Standing Position, Turn to Look Behind Over each Shoulder Turn sideways only but maintains balance    Turn 360 Degrees Able to turn 360 degrees safely but slowly    Standing Unsupported, Alternately Place Feet on Step/Stool Able to stand independently and complete 8 steps >20 seconds    Standing Unsupported, One Foot in [employer] balance while stepping or standing    Standing on One Leg Tries to lift leg/unable to hold 3 seconds but remains standing independently    Total Score 32              TODAY'S TREATMENT:                                                                                                                                09/02/22  Objective measures  Berg 32  TherEx  Bridges x10 Sidelying clams with red TB x10 B Seated LAQs with red TB x10 B with 2 second holds   NMR   Tandem stance solid surface 3x30 seconds up to MinA  Static stance on blue foam pad EO 3x30 seconds up to MinA SLS with one foot on 4 inch box solid surface 3x30 seconds B   PATIENT EDUCATION: Education details: berg test score and implications, HEP  Person educated: Patient Education method: Consulting civil engineer, Demonstration, and Verbal cues Education comprehension: verbalized understanding  HOME EXERCISE PROGRAM:  Access Code: [email or web address] URL: [email or web address] Date: 09/02/2022 Prepared by: Deniece Ree  Exercises - Supine Bridge  - 2 x daily - 7 x weekly -  1 sets - 10 reps - 2 hold - Clamshell with Resistance  - 2 x daily - 7 x weekly - 1 sets - 10 reps - 2 hold - Sitting Knee Extension with Resistance  - 2 x daily - 7 x weekly - 1 sets - 10 reps - 2 hold - Tandem Stance  - 2 x daily - 7 x weekly - 1 sets - 3 reps - 15 hold   GOALS: Goals reviewed with patient? Yes  SHORT TERM GOALS: Target date: 09/14/2022    Pt  will be independent with initial HEP for improved strength, balance, transfers and gait.  Baseline: not established on eval  Goal status: INITIAL  2.  Pt will improve 5 x STS to less than or equal to 24 seconds w/BUE support to demonstrate improved functional strength and transfer efficiency.   Baseline: 28.31s w/BUE support  Goal status: INITIAL  3.  Pt will improve gait velocity to at least 2.9 ft/s with LRAD and S* for improved gait efficiency   Baseline: 2.63 ft/s without AD and CGA Goal status: INITIAL  4.  Pt will trial various AFOs in clinic as well as ADs to determine the safest and most functional option for pt to continue to maintain independence  Baseline:  Goal status: INITIAL  5.  Berg to be assessed and LTG written  Baseline:  Goal status: MET 11/29- berg 32    LONG TERM GOALS: Target date: 10/05/2022   Pt will be independent with final HEP for improved strength, balance, transfers and gait.  Baseline:  Goal status: INITIAL  2.  Pt will improve 5 x STS to less than or equal to 20 seconds wBUE support to demonstrate improved functional strength and transfer efficiency.   Baseline: 28.31s w/BUE support Goal status: INITIAL  3.  Pt will obtain personal AFOs if deemed necessary for improved stability w/functional mobility, reduced fall risk and improved independence  Baseline:  Goal status: INITIAL  4.  Will score at least 42 on Berg to show improved functional balance skills/reduced fall risk  Baseline: Berg 32 on 11/29 Goal status: INITIAL  5.  Pt will improve gait velocity to at least 3.2 ft/s with LRAD mod I for improved gait efficiency   Baseline: 2.63 ft/s without AD and CGA Goal status: INITIAL    ASSESSMENT:  CLINICAL IMPRESSION:  [name] arrives today doing well, we checked Berg score and then focused on building appropriate HEP. Otherwise worked on functional strength and balance as time allowed. Really has a hard time with balance loss  anteriorly when challenged outside of BOS, also tends to lose balance posteriorly at first with transfers. Quads quite weak. Will continue efforts and progress as able.    OBJECTIVE IMPAIRMENTS: Abnormal gait, decreased balance, decreased coordination, decreased knowledge of condition, decreased knowledge of use of DME, decreased mobility, difficulty walking, decreased strength, impaired sensation, and pain.   ACTIVITY LIMITATIONS: carrying, lifting, standing, squatting, stairs, transfers, locomotion level, and caring for others  PARTICIPATION LIMITATIONS: laundry, community activity, occupation, and yard work  PERSONAL FACTORS: Past/current experiences and 1 comorbidity: significant bilateral neuropathy  are also affecting patient's functional outcome.   REHAB POTENTIAL: Good  CLINICAL DECISION MAKING: Stable/uncomplicated  EVALUATION COMPLEXITY: Low  PLAN:  PT FREQUENCY: 1x/week  PT DURATION: 6 weeks  PLANNED INTERVENTIONS: Therapeutic exercises, Therapeutic activity, Neuromuscular re-education, Balance training, Gait training, Patient/Family education, Self Care, Joint mobilization, Stair training, Vestibular training, Canalith repositioning, Orthotic/Fit training, DME instructions, Aquatic Therapy, Dry Needling, Electrical  stimulation, Spinal mobilization, Cryotherapy, Moist heat, Taping, Manual therapy, and Re-evaluation  PLAN FOR NEXT SESSION: strength and balance, trial AFOs    Vella Colquitt U PT DPT PN2  09/02/2022, 9:26 AM

## 2022-09-03 ENCOUNTER — Ambulatory Visit
Admission: RE | Admit: 2022-09-03 | Discharge: 2022-09-03 | Disposition: A | Discharge status: Home / Self Care | Payer: BC Managed Care – PPO | Source: Ambulatory Visit | Attending: Neurology | Admitting: Neurology

## 2022-09-03 DIAGNOSIS — R269 Unspecified abnormalities of gait and mobility: Secondary | ICD-10-CM | POA: Diagnosis not present

## 2022-09-11 ENCOUNTER — Ambulatory Visit
Admission: RE | Admit: 2022-09-11 | Discharge: 2022-09-11 | Disposition: A | Discharge status: Home / Self Care | Payer: BC Managed Care – PPO | Source: Ambulatory Visit | Attending: Neurology | Admitting: Neurology

## 2022-09-11 VITALS — BP 116/69 | HR 75

## 2022-09-11 DIAGNOSIS — R531 Weakness: Secondary | ICD-10-CM

## 2022-09-11 DIAGNOSIS — G629 Polyneuropathy, unspecified: Secondary | ICD-10-CM

## 2022-09-11 DIAGNOSIS — R269 Unspecified abnormalities of gait and mobility: Secondary | ICD-10-CM

## 2022-09-11 DIAGNOSIS — R202 Paresthesia of skin: Secondary | ICD-10-CM

## 2022-09-11 NOTE — Discharge Instructions (Signed)

## 2022-09-15 ENCOUNTER — Telehealth: Payer: Self-pay | Admitting: Neurology

## 2022-09-15 NOTE — Telephone Encounter (Signed)
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

## 2022-09-16 ENCOUNTER — Telehealth: Payer: Self-pay | Admitting: Physical Therapy

## 2022-09-16 ENCOUNTER — Ambulatory Visit: Payer: BC Managed Care – PPO | Attending: Neurology | Admitting: Physical Therapy

## 2022-09-16 DIAGNOSIS — R2681 Unsteadiness on feet: Secondary | ICD-10-CM | POA: Insufficient documentation

## 2022-09-16 DIAGNOSIS — M6281 Muscle weakness (generalized): Secondary | ICD-10-CM | POA: Insufficient documentation

## 2022-09-16 DIAGNOSIS — Z9181 History of falling: Secondary | ICD-10-CM | POA: Insufficient documentation

## 2022-09-16 DIAGNOSIS — M21372 Foot drop, left foot: Secondary | ICD-10-CM | POA: Insufficient documentation

## 2022-09-16 DIAGNOSIS — M21371 Foot drop, right foot: Secondary | ICD-10-CM

## 2022-09-16 NOTE — Telephone Encounter (Signed)
I called pt and we discussed message, he verbalized understanding.   He reports he is continuing with the prednisone 60 mg per day at this time he attempted 50 mg but did not tolerate well.   He has not heard from [facility] on scheduling IVIG.  I talked with [place] Bing and she states PA is being worked on by intake, determination should be reached soon.   Will follow back up with them next week.

## 2022-09-16 NOTE — Therapy (Signed)
OUTPATIENT PHYSICAL THERAPY NEURO TREATMENT   Patient Name: Gregory Hinton MRN: [id number] DOB:Aug 29, 1966, 56 y.o., male Today's Date: 09/16/2022   PCP: Maggie Schwalbe, PA-C REFERRING PROVIDER: Marcial Pacas, MD  END OF SESSION:  PT End of Session - 09/16/22 0932     Visit Number 3    Number of Visits 7    Date for PT Re-Evaluation 10/12/22    Authorization Type BCBS    PT Start Time [phone]    PT Stop Time 1017    PT Time Calculation (min) 46 min    Equipment Utilized During Treatment Gait belt;Other (comment)   SpryStep plus AFOs   Activity Tolerance Patient tolerated treatment well    Behavior During Therapy WFL for tasks assessed/performed               Past Medical History:  Diagnosis Date   Adrenal insufficiency ([place])    Anemia    Anxiety    Arthritis    Back pain    had 3 fusions surgeries   H/O cardiovascular stress test 2014   admitted at [facility]. overnight for chest pain, stress/echo test wnl   Hypertension    PONV (postoperative nausea and vomiting)    Rheumatoid arthritis ([place])    manages by Dr. Estanislado Pandy, rec'd Orencia weekly for 2 months, switched from Humira    Past Surgical History:  Procedure Laterality Date   ANTERIOR LAT LUMBAR FUSION Left 12/26/2018   Procedure: Left Lumbar Two-Three Anterolateral decompression/interbody fusion/lateral plate;  Surgeon: Kristeen Miss, MD;  Location: [facility];  Service: Neurosurgery;  Laterality: Left;  Anterolateral   BACK SURGERY  [phone]   lumbar fusion 3 times   CARPAL TUNNEL RELEASE Right 2000   and left side 2 years ago with left ulnar surgery   COLONOSCOPY     HERNIA REPAIR Right 2001   inguinal    REPLACEMENT TOTAL KNEE Left 05/2020   Patient Active Problem List   Diagnosis Date Noted   Neuropathy 08/14/2022   Weakness 08/03/2022   Gait abnormality 08/03/2022   Paresthesia 08/03/2022   Pneumonia due to COVID-19 virus 10/08/2020   High risk medication use 04/19/2018   History of  diverticulitis 05/04/2017   History of colon polyps 05/04/2017   Primary osteoarthritis of both hands 05/04/2017   Primary osteoarthritis of both feet 05/04/2017   Primary osteoarthritis of both knees 04/02/2017   Class 1 obesity without serious comorbidity with body mass index (BMI) of 32.0 to 32.9 in adult 10/14/2016   Spondylosis of lumbar region without myelopathy or radiculopathy 10/14/2016   DJD (degenerative joint disease), cervical 10/14/2016   High risk medications (not anticoagulants) long-term use 10/14/2016   Rheumatoid arthritis, seronegative, multiple sites ([place]) 10/14/2016   Lumbar stenosis with neurogenic claudication 12/17/2015    ONSET DATE: 08/14/2022  REFERRING DIAG: R53.1 (ICD-10-CM) - Weakness R26.9 (ICD-10-CM) - Gait abnormality R20.2 (ICD-10-CM) - Paresthesia G62.9 (ICD-10-CM) - Neuropathy  THERAPY DIAG:  Muscle weakness (generalized)  Unsteadiness on feet  Rationale for Evaluation and Treatment: Rehabilitation  SUBJECTIVE:  SUBJECTIVE STATEMENT: Pt reports he went on a cruise to the [place] last week, no issues. Felt really good this Monday and worked >12 hours and is now very swollen and tired. Has had one [place], "I am getting really good at it". No injuries, "I always fall forward".    Pt accompanied by: self  PERTINENT HISTORY: anxiety, OA, L TKA 05/2020, Hx of spinal surgery x4 (fused from T12 to S1), HTN and RA  PAIN:  Are you having pain? Yes: NPRS scale: 5/10 Pain location: BLEs  Pain description: Throbbing, can be a shooting pain almost like a vibration when pain is high  Aggravating factors: nothing, "it is what it is"  Relieving factors: prednisone has helped hands and arms   PRECAUTIONS: Fall  WEIGHT BEARING RESTRICTIONS:  No  FALLS: Has patient fallen in last 6 months? Yes. Number of falls at least 3   LIVING ENVIRONMENT: Lives with: lives with their spouse Lives in: House/apartment Stairs: No Has following equipment at home: Ramped entry  PLOF: Independent  PATIENT GOALS: "just getting better at balance"   OBJECTIVE:   DIAGNOSTIC FINDINGS: Nerve conduction study on 08/14/22:   Left sural sensory response was absent.  Left superficial peroneal sensory response showed mildly decreased snap amplitude.   Left median, ulnar, radial sensory responses showed prolonged peak latency with mildly to moderately decreased snap amplitude.   Left ulnar motor responses showed borderline prolonged distal latency, mildly decreased CMAP amplitude, 11 m/s velocity drop across left elbow.  Significantly prolonged left ulnar F-wave latency.   Left tibial motor responses also showed significantly decreased CMAP amplitude and F-wave latency   Left peroneal to EDB motor responses showed mildly decreased CMAP amplitude, moderately prolonged distal latency, and moderate slow conduction velocity,   Left median motor responses showed moderately prolonged distal latency, within normal range CMAP amplitude.   TODAY'S TREATMENT:       Ther Act  STG Assessment    [facility] PT Assessment - 09/16/22 0941       Transfers   Five time sit to stand comments  22.18s w/BUE support   RPE of 7/10     Ambulation/Gait   Gait velocity 32.8' over 12.44s = 2.63 ft/s without AD             Gait Training  GAIT: Gait pattern: step through pattern, decreased arm swing- Right, decreased arm swing- Left, decreased step length- Right, decreased step length- Left, decreased stride length, decreased ankle dorsiflexion- Right, decreased ankle dorsiflexion- Left, shuffling, scissoring, lateral hip instability, decreased trunk rotation, narrow BOS, poor foot clearance- Right, and poor foot clearance- Left Distance walked: 115', 200' and various  clinic distances Assistive device utilized:  Bilateral AFOs and None Level of assistance: SBA and CGA Comments: Without AFOs, pt ambulates w/maintained knee extension and posterior lean to maintain weight on heels. Pt had several incidents of tripping over RLE or scissoring w/LLE, requiring CA for safety. Trialed bilateral [place] SpryStep plus AFOs (115 and 230') and noted facilitation of heel strike w/TO and facilitation of knee flexion in weight bearing. Pt reported feeling much more stable w/use of braces and preferring anterior guard AFOs vs posterolateral strut AFOs. No scissoring or LOB noted w/AFOs, requiring SBA only.   Ther Ex  At counter for improved glute/hip abductor strength: Standing hip extension, x10 per side w/3s isometric hold. Min cues to reduce forward lean compensation. Noted increased difficulty performing on R side > L side  Standing  hip abduction, x10 per side. Added both to HEP (see bolded below)     PATIENT EDUCATION: Education details: updates to HEP, plan to obtain bilateral AFOs Person educated: Patient Education method: Explanation, Demonstration, and Verbal cues Education comprehension: verbalized understanding  HOME EXERCISE PROGRAM:  Access Code: [email or web address] URL: [email or web address] Date: 09/02/2022 Prepared by: Deniece Ree  Exercises - Supine Bridge  - 2 x daily - 7 x weekly - 1 sets - 10 reps - 2 hold - Clamshell with Resistance  - 2 x daily - 7 x weekly - 1 sets - 10 reps - 2 hold - Sitting Knee Extension with Resistance  - 2 x daily - 7 x weekly - 1 sets - 10 reps - 2 hold - Tandem Stance  - 2 x daily - 7 x weekly - 1 sets - 3 reps - 15 hold - Standing Hip Extension with Counter Support  - 1 x daily - 7 x weekly - 2 sets - 10 reps - Standing Hip Abduction with Counter Support  - 1 x daily - 7 x weekly - 2 sets - 10 reps   GOALS: Goals reviewed with patient? Yes  SHORT TERM GOALS: Target date: 09/14/2022    Pt will be  independent with initial HEP for improved strength, balance, transfers and gait.  Baseline: not established on eval  Goal status: MET  2.  Pt will improve 5 x STS to less than or equal to 24 seconds w/BUE support to demonstrate improved functional strength and transfer efficiency.   Baseline: 28.31s w/BUE support; 22.18s w/BUE support on 12/13 Goal status: MET  3.  Pt will improve gait velocity to at least 2.9 ft/s with LRAD and S* for improved gait efficiency   Baseline: 2.63 ft/s without AD and CGA; 2.63 ft/s w/no AD and CGA on 12/13 Goal status: IN PROGRESS  4.  Pt will trial various AFOs in clinic as well as ADs to determine the safest and most functional option for pt to continue to maintain independence  Baseline:  Goal status: MET  5.  Berg to be assessed and LTG written  Baseline:  Goal status: MET 11/29- berg 32    LONG TERM GOALS: Target date: 10/05/2022   Pt will be independent with final HEP for improved strength, balance, transfers and gait.  Baseline:  Goal status: INITIAL  2.  Pt will improve 5 x STS to less than or equal to 20 seconds wBUE support to demonstrate improved functional strength and transfer efficiency.   Baseline: 28.31s w/BUE support Goal status: INITIAL  3.  Pt will obtain personal AFOs if deemed necessary for improved stability w/functional mobility, reduced fall risk and improved independence  Baseline:  Goal status: INITIAL  4.  Will score at least 42 on Berg to show improved functional balance skills/reduced fall risk  Baseline: Berg 32 on 11/29 Goal status: INITIAL  5.  Pt will improve gait velocity to at least 3.2 ft/s with LRAD mod I for improved gait efficiency   Baseline: 2.63 ft/s without AD and CGA Goal status: INITIAL    ASSESSMENT:  CLINICAL IMPRESSION: Emphasis of skilled PT session on STG assessment, gait training w/AFOs and BLE strength. Pt has met 4/5 STGs, improving his 5x STS to 22.18s w/BUE support and no  instability noted. Pt has maintained his gait speed from eval, so he is progressing that goal. However, once pt obtains AFOs, gait speed likely to improve due to increased stability. Pt has been performing HEP regularly  and is in agreement to obtain SpryStep Plus AFOs to assist with balance. Pt continues to have falls at work but fall frequency has reduced since starting PT. Pt demonstrates significant weakness in bilateral quads, R glute med and abdominals, so will continue to work on strengthening for improved stability and pain modulation. Continue POC.    OBJECTIVE IMPAIRMENTS: Abnormal gait, decreased balance, decreased coordination, decreased knowledge of condition, decreased knowledge of use of DME, decreased mobility, difficulty walking, decreased strength, impaired sensation, and pain.   ACTIVITY LIMITATIONS: carrying, lifting, standing, squatting, stairs, transfers, locomotion level, and caring for others  PARTICIPATION LIMITATIONS: laundry, community activity, occupation, and yard work  PERSONAL FACTORS: Past/current experiences and 1 comorbidity: significant bilateral neuropathy  are also affecting patient's functional outcome.   REHAB POTENTIAL: Good  CLINICAL DECISION MAKING: Stable/uncomplicated  EVALUATION COMPLEXITY: Low  PLAN:  PT FREQUENCY: 1x/week  PT DURATION: 6 weeks  PLANNED INTERVENTIONS: Therapeutic exercises, Therapeutic activity, Neuromuscular re-education, Balance training, Gait training, Patient/Family education, Self Care, Joint mobilization, Stair training, Vestibular training, Canalith repositioning, Orthotic/Fit training, DME instructions, Aquatic Therapy, Dry Needling, Electrical stimulation, Spinal mobilization, Cryotherapy, Moist heat, Taping, Manual therapy, and Re-evaluation  PLAN FOR NEXT SESSION: Did we get AFO order? R hip strength, quad strength, core stability    Cruzita Lederer Joachim Carton, PT, DPT [facility] [address] [place], [place]  [address] Phone:  [phone] Fax:  [phone] 09/16/2022, 10:24 AM

## 2022-09-16 NOTE — Telephone Encounter (Signed)
Dr. 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     If you agree, please addend your most recent visit note with him (08/03/21) to state medical necessity for AFOs and place an order in [facility] workque in [facility] or fax the order to [phone].  Thank you, Ernestene Kiel, PT, DPT  [facility] [address] [place], [place]  [address] Phone:  [phone] Fax:  [phone]

## 2022-09-16 NOTE — Telephone Encounter (Signed)
Orders Placed This Encounter  Procedures   PT orthosis to lower extremity      

## 2022-09-21 ENCOUNTER — Encounter: Payer: Self-pay | Admitting: Neurology

## 2022-09-21 ENCOUNTER — Telehealth: Payer: Self-pay

## 2022-09-21 DIAGNOSIS — G6181 Chronic inflammatory demyelinating polyneuritis: Secondary | ICD-10-CM | POA: Insufficient documentation

## 2022-09-21 DIAGNOSIS — G6289 Other specified polyneuropathies: Secondary | ICD-10-CM

## 2022-09-21 NOTE — Telephone Encounter (Signed)
UPDATED his diagnostic code     1.   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

## 2022-09-21 NOTE — Telephone Encounter (Signed)
I spoke with [facility] w/[facility] last week.   PA needs updated Dx for IVIG,. Per plan Guidelines, approval will be met it pt has Dx of CIDP or Multifocal Motor Neuropathy.   These do not match the previous office visit.   Will have Dr. Krista Blue Review.

## 2022-09-22 NOTE — Telephone Encounter (Signed)
I have given this information to [place] in the infusion suite.

## 2022-09-22 NOTE — Telephone Encounter (Signed)
I spoke with Jeanice Lim, she has the signed PA form from Dr. Terrace Arabia and is working on it.

## 2022-09-23 ENCOUNTER — Ambulatory Visit: Payer: BC Managed Care – PPO | Admitting: Physical Therapy

## 2022-09-23 DIAGNOSIS — R2681 Unsteadiness on feet: Secondary | ICD-10-CM

## 2022-09-23 DIAGNOSIS — Z9181 History of falling: Secondary | ICD-10-CM

## 2022-09-23 DIAGNOSIS — M6281 Muscle weakness (generalized): Secondary | ICD-10-CM

## 2022-09-23 NOTE — Telephone Encounter (Signed)
Dr. Terrace Arabia updated the Dx code as requested by [facility] and PA provided on 09/22/2022 to [facility]. September 22, 2022 Gregory Hinton, [place]      09/22/22  9:07 AM Note I have given this information to [facility] in the infusion suite.

## 2022-09-23 NOTE — Therapy (Signed)
OUTPATIENT PHYSICAL THERAPY NEURO TREATMENT   Patient Name: Gregory Hinton MRN: [id number] DOB:Oct 09, 1965, 56 y.o., male Today's Date: 09/23/2022   PCP: Maggie Schwalbe, PA-C REFERRING PROVIDER: Marcial Pacas, MD  END OF SESSION:  PT End of Session - 09/23/22 0928     Visit Number 4    Number of Visits 7    Date for PT Re-Evaluation 10/12/22    Authorization Type BCBS    PT Start Time 0927    PT Stop Time 1010    PT Time Calculation (min) 43 min    Equipment Utilized During Treatment Gait belt    Activity Tolerance Patient tolerated treatment well    Behavior During Therapy WFL for tasks assessed/performed                Past Medical History:  Diagnosis Date   Adrenal insufficiency ([place])    Anemia    Anxiety    Arthritis    Back pain    had 3 fusions surgeries   H/O cardiovascular stress test 2014   admitted at [facility]. overnight for chest pain, stress/echo test wnl   Hypertension    PONV (postoperative nausea and vomiting)    Rheumatoid arthritis ([place])    manages by Dr. Estanislado Pandy, rec'd Orencia weekly for 2 months, switched from Humira    Past Surgical History:  Procedure Laterality Date   ANTERIOR LAT LUMBAR FUSION Left 12/26/2018   Procedure: Left Lumbar Two-Three Anterolateral decompression/interbody fusion/lateral plate;  Surgeon: Kristeen Miss, MD;  Location: [facility];  Service: Neurosurgery;  Laterality: Left;  Anterolateral   BACK SURGERY  [phone]   lumbar fusion 3 times   CARPAL TUNNEL RELEASE Right 2000   and left side 2 years ago with left ulnar surgery   COLONOSCOPY     HERNIA REPAIR Right 2001   inguinal    REPLACEMENT TOTAL KNEE Left 05/2020   Patient Active Problem List   Diagnosis Date Noted   CIDP (chronic inflammatory demyelinating polyneuropathy) ([place]) 09/21/2022   Bilateral foot-drop 09/16/2022   Unsteadiness on feet 09/16/2022   Neuropathy 08/14/2022   Weakness 08/03/2022   Gait abnormality 08/03/2022   Paresthesia 08/03/2022    Pneumonia due to COVID-19 virus 10/08/2020   High risk medication use 04/19/2018   History of diverticulitis 05/04/2017   History of colon polyps 05/04/2017   Primary osteoarthritis of both hands 05/04/2017   Primary osteoarthritis of both feet 05/04/2017   Primary osteoarthritis of both knees 04/02/2017   Class 1 obesity without serious comorbidity with body mass index (BMI) of 32.0 to 32.9 in adult 10/14/2016   Spondylosis of lumbar region without myelopathy or radiculopathy 10/14/2016   DJD (degenerative joint disease), cervical 10/14/2016   High risk medications (not anticoagulants) long-term use 10/14/2016   Rheumatoid arthritis, seronegative, multiple sites ([place]) 10/14/2016   Lumbar stenosis with neurogenic claudication 12/17/2015    ONSET DATE: 08/14/2022  REFERRING DIAG: R53.1 (ICD-10-CM) - Weakness R26.9 (ICD-10-CM) - Gait abnormality R20.2 (ICD-10-CM) - Paresthesia G62.9 (ICD-10-CM) - Neuropathy  THERAPY DIAG:  Unsteadiness on feet  Muscle weakness (generalized)  History of falling  Rationale for Evaluation and Treatment: Rehabilitation  SUBJECTIVE:  SUBJECTIVE STATEMENT: Pt reports he "bottomed out" last weekend, had to take Friday-Sunday off and rest. No falls since last session. Has an appointment with [facility] on Jan 9 to get fitted for AFOs.    Pt accompanied by: self  PERTINENT HISTORY: anxiety, OA, L TKA 05/2020, Hx of spinal surgery x4 (fused from T12 to S1), HTN and RA  PAIN:  Are you having pain? Yes: NPRS scale: 5-6/10 Pain location: BLEs  Pain description: Throbbing, can be a shooting pain almost like a vibration when pain is high  Aggravating factors: nothing, "it is what it is"  Relieving factors: prednisone has helped hands and arms   PRECAUTIONS: Fall  WEIGHT  BEARING RESTRICTIONS: No  FALLS: Has patient fallen in last 6 months? Yes. Number of falls at least 3   LIVING ENVIRONMENT: Lives with: lives with their spouse Lives in: House/apartment Stairs: No Has following equipment at home: Ramped entry  PLOF: Independent  PATIENT GOALS: "just getting better at balance"   OBJECTIVE:   DIAGNOSTIC FINDINGS: Nerve conduction study on 08/14/22:   Left sural sensory response was absent.  Left superficial peroneal sensory response showed mildly decreased snap amplitude.   Left median, ulnar, radial sensory responses showed prolonged peak latency with mildly to moderately decreased snap amplitude.   Left ulnar motor responses showed borderline prolonged distal latency, mildly decreased CMAP amplitude, 11 m/s velocity drop across left elbow.  Significantly prolonged left ulnar F-wave latency.   Left tibial motor responses also showed significantly decreased CMAP amplitude and F-wave latency   Left peroneal to EDB motor responses showed mildly decreased CMAP amplitude, moderately prolonged distal latency, and moderate slow conduction velocity,   Left median motor responses showed moderately prolonged distal latency, within normal range CMAP amplitude.   TODAY'S TREATMENT:       Ther Ex  SciFit multi-peaks level 2 for 8 minutes using BUE/BLEs for neural priming for reciprocal movement, dynamic cardiovascular warmup and increased amplitude of stepping. Min cues to maintain steps/min >65. RPE of 5/10 following activity  In // bars for improved glute/hip strength, single leg stability and BLE strength: Lateral monster walks w/yellow theraband around distal quads, x3 down and back w/BUE support.  Fwd/retro monster walks w/yellow theraband around distal quads, x3 down and back w/BUE support. Noted increased difficulty stepping to R side > L side  Alt 6" cone taps w/BUE support, x10 per side. Noted increased difficulty tapping w/RLE but pt able to perform  slowly w/intermittent standing rest breaks.  KB (10#) deadlifts to 16" target w/CGA, x12 reps. Min cues for proper form throughout. No instability noted.  Pt rated fatigue as 8-9/10 RPE at end of session.     PATIENT EDUCATION: Education details: Continue HEP Person educated: Patient Education method: Explanation, Demonstration, and Verbal cues Education comprehension: verbalized understanding  HOME EXERCISE PROGRAM:  Access Code: [email or web address] URL: [email or web address] Date: 09/02/2022 Prepared by: Deniece Ree  Exercises - Supine Bridge  - 2 x daily - 7 x weekly - 1 sets - 10 reps - 2 hold - Clamshell with Resistance  - 2 x daily - 7 x weekly - 1 sets - 10 reps - 2 hold - Sitting Knee Extension with Resistance  - 2 x daily - 7 x weekly - 1 sets - 10 reps - 2 hold - Tandem Stance  - 2 x daily - 7 x weekly - 1 sets - 3 reps - 15 hold - Standing Hip Extension with Counter Support  - 1 x daily -  7 x weekly - 2 sets - 10 reps - Standing Hip Abduction with Counter Support  - 1 x daily - 7 x weekly - 2 sets - 10 reps   GOALS: Goals reviewed with patient? Yes  SHORT TERM GOALS: Target date: 09/14/2022    Pt will be independent with initial HEP for improved strength, balance, transfers and gait.  Baseline: not established on eval  Goal status: MET  2.  Pt will improve 5 x STS to less than or equal to 24 seconds w/BUE support to demonstrate improved functional strength and transfer efficiency.   Baseline: 28.31s w/BUE support; 22.18s w/BUE support on 12/13 Goal status: MET  3.  Pt will improve gait velocity to at least 2.9 ft/s with LRAD and S* for improved gait efficiency   Baseline: 2.63 ft/s without AD and CGA; 2.63 ft/s w/no AD and CGA on 12/13 Goal status: IN PROGRESS  4.  Pt will trial various AFOs in clinic as well as ADs to determine the safest and most functional option for pt to continue to maintain independence  Baseline:  Goal status: MET  5.  Berg  to be assessed and LTG written  Baseline:  Goal status: MET 11/29- berg 32    LONG TERM GOALS: Target date: 10/05/2022   Pt will be independent with final HEP for improved strength, balance, transfers and gait.  Baseline:  Goal status: INITIAL  2.  Pt will improve 5 x STS to less than or equal to 20 seconds wBUE support to demonstrate improved functional strength and transfer efficiency.   Baseline: 28.31s w/BUE support Goal status: INITIAL  3.  Pt will obtain personal AFOs if deemed necessary for improved stability w/functional mobility, reduced fall risk and improved independence  Baseline:  Goal status: INITIAL  4.  Will score at least 42 on Berg to show improved functional balance skills/reduced fall risk  Baseline: Berg 32 on 11/29 Goal status: INITIAL  5.  Pt will improve gait velocity to at least 3.2 ft/s with LRAD mod I for improved gait efficiency   Baseline: 2.63 ft/s without AD and CGA Goal status: INITIAL    ASSESSMENT:  CLINICAL IMPRESSION: Emphasis of skilled PT session on quad/hip/glute strengthening as well as single leg stability. Pt tolerated treatment well w/intermittent standing rest breaks but fatigued very quickly. Pt most challenged by deadlifts, although this is very functional for his occupation. Pt continues to be limited by significant bilateral quad and glute weakness (R>L) but is very motivated to participate. Continue POC.    OBJECTIVE IMPAIRMENTS: Abnormal gait, decreased balance, decreased coordination, decreased knowledge of condition, decreased knowledge of use of DME, decreased mobility, difficulty walking, decreased strength, impaired sensation, and pain.   ACTIVITY LIMITATIONS: carrying, lifting, standing, squatting, stairs, transfers, locomotion level, and caring for others  PARTICIPATION LIMITATIONS: laundry, community activity, occupation, and yard work  PERSONAL FACTORS: Past/current experiences and 1 comorbidity: significant  bilateral neuropathy  are also affecting patient's functional outcome.   REHAB POTENTIAL: Good  CLINICAL DECISION MAKING: Stable/uncomplicated  EVALUATION COMPLEXITY: Low  PLAN:  PT FREQUENCY: 1x/week  PT DURATION: 6 weeks  PLANNED INTERVENTIONS: Therapeutic exercises, Therapeutic activity, Neuromuscular re-education, Balance training, Gait training, Patient/Family education, Self Care, Joint mobilization, Stair training, Vestibular training, Canalith repositioning, Orthotic/Fit training, DME instructions, Aquatic Therapy, Dry Needling, Electrical stimulation, Spinal mobilization, Cryotherapy, Moist heat, Taping, Manual therapy, and Re-evaluation  PLAN FOR NEXT SESSION: R hip strength, quad strength, core stability, endurance    Tenya Araque E Markeria Goetsch, PT, DPT  [facility] [address] [place], [place]  [address] Phone:  [phone] Fax:  [phone] 09/23/2022, 10:11 AM

## 2022-09-30 ENCOUNTER — Ambulatory Visit: Payer: BC Managed Care – PPO

## 2022-10-07 ENCOUNTER — Ambulatory Visit: Payer: BC Managed Care – PPO | Attending: Neurology

## 2022-10-07 DIAGNOSIS — M21371 Foot drop, right foot: Secondary | ICD-10-CM | POA: Diagnosis present

## 2022-10-07 DIAGNOSIS — R2681 Unsteadiness on feet: Secondary | ICD-10-CM | POA: Diagnosis present

## 2022-10-07 DIAGNOSIS — M6281 Muscle weakness (generalized): Secondary | ICD-10-CM | POA: Diagnosis present

## 2022-10-07 DIAGNOSIS — Z9181 History of falling: Secondary | ICD-10-CM | POA: Diagnosis present

## 2022-10-07 DIAGNOSIS — M21372 Foot drop, left foot: Secondary | ICD-10-CM | POA: Diagnosis present

## 2022-10-07 NOTE — Therapy (Signed)
OUTPATIENT PHYSICAL THERAPY NEURO TREATMENT   Patient Name: DONZELL COLLER MRN: [id number] DOB:08-12-1966, 57 y.o., male Today's Date: 10/07/2022   PCP: Maggie Schwalbe, PA-C REFERRING PROVIDER: Marcial Pacas, MD  END OF SESSION:  PT End of Session - 10/07/22 0931     Visit Number 5    Number of Visits 7    Date for PT Re-Evaluation 11/11/22    Authorization Type BCBS    PT Start Time 0930    PT Stop Time 1012    PT Time Calculation (min) 42 min    Activity Tolerance Patient tolerated treatment well    Behavior During Therapy WFL for tasks assessed/performed                Past Medical History:  Diagnosis Date   Adrenal insufficiency ([place])    Anemia    Anxiety    Arthritis    Back pain    had 3 fusions surgeries   H/O cardiovascular stress test 2014   admitted at [facility]. overnight for chest pain, stress/echo test wnl   Hypertension    PONV (postoperative nausea and vomiting)    Rheumatoid arthritis ([place])    manages by Dr. Estanislado Pandy, rec'd Orencia weekly for 2 months, switched from Humira    Past Surgical History:  Procedure Laterality Date   ANTERIOR LAT LUMBAR FUSION Left 12/26/2018   Procedure: Left Lumbar Two-Three Anterolateral decompression/interbody fusion/lateral plate;  Surgeon: Kristeen Miss, MD;  Location: [facility];  Service: Neurosurgery;  Laterality: Left;  Anterolateral   BACK SURGERY  [phone]   lumbar fusion 3 times   CARPAL TUNNEL RELEASE Right 2000   and left side 2 years ago with left ulnar surgery   COLONOSCOPY     HERNIA REPAIR Right 2001   inguinal    REPLACEMENT TOTAL KNEE Left 05/2020   Patient Active Problem List   Diagnosis Date Noted   CIDP (chronic inflammatory demyelinating polyneuropathy) ([place]) 09/21/2022   Bilateral foot-drop 09/16/2022   Unsteadiness on feet 09/16/2022   Neuropathy 08/14/2022   Weakness 08/03/2022   Gait abnormality 08/03/2022   Paresthesia 08/03/2022   Pneumonia due to COVID-19 virus 10/08/2020    High risk medication use 04/19/2018   History of diverticulitis 05/04/2017   History of colon polyps 05/04/2017   Primary osteoarthritis of both hands 05/04/2017   Primary osteoarthritis of both feet 05/04/2017   Primary osteoarthritis of both knees 04/02/2017   Class 1 obesity without serious comorbidity with body mass index (BMI) of 32.0 to 32.9 in adult 10/14/2016   Spondylosis of lumbar region without myelopathy or radiculopathy 10/14/2016   DJD (degenerative joint disease), cervical 10/14/2016   High risk medications (not anticoagulants) long-term use 10/14/2016   Rheumatoid arthritis, seronegative, multiple sites ([place]) 10/14/2016   Lumbar stenosis with neurogenic claudication 12/17/2015    ONSET DATE: 08/14/2022  REFERRING DIAG: R53.1 (ICD-10-CM) - Weakness R26.9 (ICD-10-CM) - Gait abnormality R20.2 (ICD-10-CM) - Paresthesia G62.9 (ICD-10-CM) - Neuropathy  THERAPY DIAG:  Unsteadiness on feet  Muscle weakness (generalized)  History of falling  Bilateral foot-drop  Rationale for Evaluation and Treatment: Rehabilitation  SUBJECTIVE:  SUBJECTIVE STATEMENT: Patient reports doing well. Going to [employer] next week. Did trip yesterday over his toes and then hit his hand. No other falls/ trips. Has started to cut back on the Prednisone and reports he feels as though that may be contributing to his fatigue. States his eyes are "struggling" and his acuity has decreased.    Pt accompanied by: self  PERTINENT HISTORY: anxiety, OA, L TKA 05/2020, Hx of spinal surgery x4 (fused from T12 to S1), HTN and RA  PAIN:  Are you having pain? Yes: NPRS scale: 4-5/10 Pain location: BLEs  Pain description: Throbbing, can be a shooting pain almost like a vibration when pain is high  Aggravating factors: nothing,  "it is what it is"  Relieving factors: prednisone has helped hands and arms   PRECAUTIONS: Fall  PATIENT GOALS: "just getting better at balance"   OBJECTIVE:   DIAGNOSTIC FINDINGS: Nerve conduction study on 08/14/22:   Left sural sensory response was absent.  Left superficial peroneal sensory response showed mildly decreased snap amplitude.   Left median, ulnar, radial sensory responses showed prolonged peak latency with mildly to moderately decreased snap amplitude.   Left ulnar motor responses showed borderline prolonged distal latency, mildly decreased CMAP amplitude, 11 m/s velocity drop across left elbow.  Significantly prolonged left ulnar F-wave latency.   Left tibial motor responses also showed significantly decreased CMAP amplitude and F-wave latency   Left peroneal to EDB motor responses showed mildly decreased CMAP amplitude, moderately prolonged distal latency, and moderate slow conduction velocity,   Left median motor responses showed moderately prolonged distal latency, within normal range CMAP amplitude.   TODAY'S TREATMENT:       Theract  Scifit hills level 3 x 10 mins B UE/LE, steps/ min >65 goal   [facility] PT Assessment - 10/07/22 0001       Standardized Balance Assessment   Standardized Balance Assessment Five Times Sit to Stand    Five times sit to stand comments  25.28s    10 Meter Walk .69ms or 2.736fs      Berg Balance Test   Sit to Stand Able to stand without using hands and stabilize independently    Standing Unsupported Able to stand 30 seconds unsupported    Sitting with Back Unsupported but Feet Supported on Floor or Stool Able to sit 2 minutes under supervision    Stand to Sit Controls descent by using hands    Transfers Able to transfer safely, definite need of hands    Standing Unsupported with Eyes Closed Able to stand 10 seconds with supervision    Standing Unsupported with Feet Together Able to place feet together independently and stand for 1  minute with supervision    From Standing, Reach Forward with Outstretched Arm Reaches forward but needs supervision    From Standing Position, Pick up Object from Floor Unable to try/needs assist to keep balance    From Standing Position, Turn to Look Behind Over each Shoulder Needs supervision when turning    Turn 360 Degrees Needs close supervision or verbal cueing    Standing Unsupported, Alternately Place Feet on Step/Stool Needs assistance to keep from falling or unable to try    Standing Unsupported, One Foot in FrONEOKalance while stepping or standing    Standing on One Leg Unable to try or needs assist to prevent fall    Total Score 24            -x5 sit <> stand no  UE (rated 8/10 RPE)    PATIENT EDUCATION: Education details: Continue HEP, exam findings Person educated: Patient Education method: Explanation, Demonstration, and Verbal cues Education comprehension: verbalized understanding  HOME EXERCISE PROGRAM:  Access Code: [email or web address] URL: [email or web address] Date: 09/02/2022 Prepared by: Deniece Ree  Exercises - Supine Bridge  - 2 x daily - 7 x weekly - 1 sets - 10 reps - 2 hold - Clamshell with Resistance  - 2 x daily - 7 x weekly - 1 sets - 10 reps - 2 hold - Sitting Knee Extension with Resistance  - 2 x daily - 7 x weekly - 1 sets - 10 reps - 2 hold - Tandem Stance  - 2 x daily - 7 x weekly - 1 sets - 3 reps - 15 hold - Standing Hip Extension with Counter Support  - 1 x daily - 7 x weekly - 2 sets - 10 reps - Standing Hip Abduction with Counter Support  - 1 x daily - 7 x weekly - 2 sets - 10 reps   GOALS: Goals reviewed with patient? Yes  SHORT TERM GOALS: Target date: 09/14/2022    Pt will be independent with initial HEP for improved strength, balance, transfers and gait.  Baseline: not established on eval  Goal status: MET  2.  Pt will improve 5 x STS to less than or equal to 24 seconds w/BUE support to demonstrate improved  functional strength and transfer efficiency.   Baseline: 28.31s w/BUE support; 22.18s w/BUE support on 12/13 Goal status: MET  3.  Pt will improve gait velocity to at least 2.9 ft/s with LRAD and S* for improved gait efficiency   Baseline: 2.63 ft/s without AD and CGA; 2.63 ft/s w/no AD and CGA on 12/13 Goal status: IN PROGRESS  4.  Pt will trial various AFOs in clinic as well as ADs to determine the safest and most functional option for pt to continue to maintain independence  Baseline:  Goal status: MET  5.  Berg to be assessed and LTG written  Baseline:  Goal status: MET 11/29- berg 32    LONG TERM GOALS: Target date: 10/05/2022   Pt will be independent with final HEP for improved strength, balance, transfers and gait.  Baseline: provided Goal status: MET  2.  Pt will improve 5 x STS to less than or equal to 20 seconds wBUE support to demonstrate improved functional strength and transfer efficiency.   Baseline: 28.31s w/BUE support; 25.28s with B UE Goal status: IN PROGRESS  3.  Pt will obtain personal AFOs if deemed necessary for improved stability w/functional mobility, reduced fall risk and improved independence  Baseline: going next week Goal status: IN PROGRESS  4.  Will score at least 42 on Berg to show improved functional balance skills/reduced fall risk  Baseline: Berg 32 on 11/29; 24 Goal status: IN PROGRESS  5.  Pt will improve gait velocity to at least 3.2 ft/s with LRAD mod I for improved gait efficiency   Baseline: 2.63 ft/s without AD and CGA; .48ms or 2.76fs with supervision Goal status: IN PROGRESS  NEW LONG TERM GOALS: Target date: 11/11/2022   Pt will be independent with final HEP for improved strength, balance, transfers and gait.  Baseline: provided Goal status: MET  2.  Pt will improve 5 x STS to less than or equal to 20 seconds wBUE support to demonstrate improved functional strength and transfer efficiency.   Baseline: 25.28s with B  UE Goal status: NEW  3.  Pt will obtain personal AFOs if deemed necessary for improved stability w/functional mobility, reduced fall risk and improved independence  Baseline: going next week Goal status: NEW  4.  Will score at least 32 on Berg to show improved functional balance skills/reduced fall risk  Baseline: Berg 24 Goal status: NEW  5.  Pt will improve gait velocity to at least 3.2 ft/s with LRAD mod I for improved gait efficiency   Baseline: .8ms or 2.739fs with supervision Goal status: NEW    ASSESSMENT:  CLINICAL IMPRESSION: Patient seen for skilled PT session with emphasis on goal assessment and re-cert. Five times Sit to Stand Test (FTSS) Method: Use a straight back chair with a solid seat that is 17-18" high. Ask participant to sit on the chair with arms folded across their chest.   Instructions: "Stand up and sit down as quickly as possible 5 times, keeping your arms folded across your chest."   Measurement: Stop timing when the participant touches the chair in sitting the 5th time.  TIME: 25.28 sec with B UE  Cut off scores indicative of increased fall risk: >12 sec CVA, >16 sec PD, >13 sec vestibular (ANPTA Core Set of Outcome Measures for Adults with Neurologic Conditions, 2018). 10 Meter Walk Test: Patient instructed to walk 10 meters (32.8 ft) as quickly and as safely as possible at their normal speed x2 and at a fast speed x2. Time measured from 2 meter mark to 8 meter mark to accommodate ramp-up and ramp-down.  Normal speed: .8447mCut off scores: <0.4 m/s = household Ambulator, 0.4-0.8 m/s = limited community Ambulator, >0.8 m/s = community Ambulator, >1.2 m/s = crossing a street, <1.0 = increased fall risk MCID 0.05 m/s (small), 0.13 m/s (moderate), 0.06 m/s (significant)  (ANPTA Core Set of Outcome Measures for Adults with Neurologic Conditions, 2018). Patient demonstrated increased fall risk noted by score of 24/56 on the [facility].  <45/56 =  fall risk, <42/56 = predictive of recurrent falls, <40/56 = 100% fall risk  >41 = independent, 21-40 = assistive device, 0-20 = wheelchair level  MDC 6.9 (4 pts 45-56, 5 pts 35-44, 7 pts 25-34) (ANPTA Core Set of Outcome Measures for Adults with Neurologic Conditions, 2018). Patient will continue to benefit from skilled PT services to address the above mentioned deficits. Continue POC.   OBJECTIVE IMPAIRMENTS: Abnormal gait, decreased balance, decreased coordination, decreased knowledge of condition, decreased knowledge of use of DME, decreased mobility, difficulty walking, decreased strength, impaired sensation, and pain.   ACTIVITY LIMITATIONS: carrying, lifting, standing, squatting, stairs, transfers, locomotion level, and caring for others  PARTICIPATION LIMITATIONS: laundry, community activity, occupation, and yard work  PERSONAL FACTORS: Past/current experiences and 1 comorbidity: significant bilateral neuropathy  are also affecting patient's functional outcome.   REHAB POTENTIAL: Good  CLINICAL DECISION MAKING: Stable/uncomplicated  EVALUATION COMPLEXITY: Low  PLAN:  PT FREQUENCY: 1x/week 1x a week  PT DURATION: 6 weeks 4 weeks  PLANNED INTERVENTIONS: Therapeutic exercises, Therapeutic activity, Neuromuscular re-education, Balance training, Gait training, Patient/Family education, Self Care, Joint mobilization, Stair training, Vestibular training, Canalith repositioning, Orthotic/Fit training, DME instructions, Aquatic Therapy, Dry Needling, Electrical stimulation, Spinal mobilization, Cryotherapy, Moist heat, Taping, Manual therapy, and Re-evaluation  PLAN FOR NEXT SESSION: R hip strength, quad strength, core stability, endurance    JenDebbora DusT, DPT, CBIS 10/07/2022, 10:20 AM

## 2022-10-12 LAB — CSF CELL COUNT WITH DIFFERENTIAL
RBC Count, CSF: 1 cells/uL — ABNORMAL HIGH
TOTAL NUCLEATED CELL: 1 cells/uL (ref 0–5)

## 2022-10-12 LAB — FUNGUS CULTURE W SMEAR
CULTURE:: NO GROWTH
MICRO NUMBER:: 14289699
SMEAR:: NONE SEEN
SPECIMEN QUALITY:: ADEQUATE

## 2022-10-12 LAB — GRAM STAIN
MICRO NUMBER:: 14289698
SPECIMEN QUALITY:: ADEQUATE

## 2022-10-12 LAB — GLUCOSE, CSF: Glucose, CSF: 63 mg/dL (ref 40–80)

## 2022-10-12 LAB — VDRL, CSF: VDRL Quant, CSF: NONREACTIVE

## 2022-10-12 LAB — PROTEIN, CSF: Total Protein, CSF: 142 mg/dL — ABNORMAL HIGH (ref 15–45)

## 2022-10-13 ENCOUNTER — Encounter: Payer: Self-pay | Admitting: Physical Therapy

## 2022-10-13 ENCOUNTER — Ambulatory Visit: Payer: BC Managed Care – PPO | Admitting: Physical Therapy

## 2022-10-13 ENCOUNTER — Telehealth: Payer: Self-pay | Admitting: Neurology

## 2022-10-13 NOTE — Telephone Encounter (Signed)
Check on his IVIG status, make sure he would have a follow up with me after 2 sessions of IVIG treatment

## 2022-10-13 NOTE — Therapy (Signed)
[facility] [address] [place], [place], [address] Phone: [phone]   Fax:  [phone]  Patient Details  Name: Gregory Hinton MRN: [id number] Date of Birth: 29-Jan-1966 Referring Provider:  No ref. provider found  Encounter Date: 10/13/2022  Called and spoke to pt regarding not showing to his scheduled PT appointment today. Pt mixed up his appointment dates but will be here on next scheduled visit.   Cruzita Lederer Lundynn Cohoon, PT, DPT 10/13/2022, 9:49 AM  [facility] [address] [place], [place], [address] Phone: [phone]   Fax:  [phone]

## 2022-10-14 NOTE — Telephone Encounter (Signed)
I spoke with the infusion suite 10/12/2022 ( see my chart message from this date.). Additional information from the insurance was requested on 10/06/22 by [facility] and we provided. The determination should be reached this week (per infusion staff).

## 2022-10-19 NOTE — Telephone Encounter (Signed)
Checkin with [facility]fusion. Gregory Hinton states PA still pending 10/19/22 is holiday for intake team with [facility]. Will check back at the end of the week.

## 2022-10-19 NOTE — Telephone Encounter (Addendum)
Error

## 2022-10-21 ENCOUNTER — Ambulatory Visit: Payer: BC Managed Care – PPO

## 2022-10-21 DIAGNOSIS — R2681 Unsteadiness on feet: Secondary | ICD-10-CM | POA: Diagnosis not present

## 2022-10-21 DIAGNOSIS — Z9181 History of falling: Secondary | ICD-10-CM

## 2022-10-21 DIAGNOSIS — M6281 Muscle weakness (generalized): Secondary | ICD-10-CM

## 2022-10-21 DIAGNOSIS — M21371 Foot drop, right foot: Secondary | ICD-10-CM

## 2022-10-21 NOTE — Therapy (Signed)
OUTPATIENT PHYSICAL THERAPY NEURO TREATMENT   Patient Name: Gregory Hinton MRN: [id number] DOB:Jun 27, 1966, 57 y.o., male Today's Date: 10/21/2022   PCP: Maggie Schwalbe, PA-C REFERRING PROVIDER: Marcial Pacas, MD  END OF SESSION:  PT End of Session - 10/21/22 0921     Visit Number 6    Number of Visits 11    Date for PT Re-Evaluation 11/11/22    Authorization Type BCBS    Progress Note Due on Visit 10    PT Start Time 0928    PT Stop Time 1013    PT Time Calculation (min) 45 min    Activity Tolerance Patient tolerated treatment well    Behavior During Therapy [facility] for tasks assessed/performed                Past Medical History:  Diagnosis Date   Adrenal insufficiency ([place])    Anemia    Anxiety    Arthritis    Back pain    had 3 fusions surgeries   H/O cardiovascular stress test 2014   admitted at [facility]. overnight for chest pain, stress/echo test wnl   Hypertension    PONV (postoperative nausea and vomiting)    Rheumatoid arthritis ([place])    manages by Dr. Estanislado Pandy, rec'd Orencia weekly for 2 months, switched from Humira    Past Surgical History:  Procedure Laterality Date   ANTERIOR LAT LUMBAR FUSION Left 12/26/2018   Procedure: Left Lumbar Two-Three Anterolateral decompression/interbody fusion/lateral plate;  Surgeon: Kristeen Miss, MD;  Location: [facility];  Service: Neurosurgery;  Laterality: Left;  Anterolateral   BACK SURGERY  [phone]   lumbar fusion 3 times   CARPAL TUNNEL RELEASE Right 2000   and left side 2 years ago with left ulnar surgery   COLONOSCOPY     HERNIA REPAIR Right 2001   inguinal    REPLACEMENT TOTAL KNEE Left 05/2020   Patient Active Problem List   Diagnosis Date Noted   CIDP (chronic inflammatory demyelinating polyneuropathy) ([place]) 09/21/2022   Bilateral foot-drop 09/16/2022   Unsteadiness on feet 09/16/2022   Neuropathy 08/14/2022   Weakness 08/03/2022   Gait abnormality 08/03/2022   Paresthesia 08/03/2022   Pneumonia  due to COVID-19 virus 10/08/2020   High risk medication use 04/19/2018   History of diverticulitis 05/04/2017   History of colon polyps 05/04/2017   Primary osteoarthritis of both hands 05/04/2017   Primary osteoarthritis of both feet 05/04/2017   Primary osteoarthritis of both knees 04/02/2017   Class 1 obesity without serious comorbidity with body mass index (BMI) of 32.0 to 32.9 in adult 10/14/2016   Spondylosis of lumbar region without myelopathy or radiculopathy 10/14/2016   DJD (degenerative joint disease), cervical 10/14/2016   High risk medications (not anticoagulants) long-term use 10/14/2016   Rheumatoid arthritis, seronegative, multiple sites ([place]) 10/14/2016   Lumbar stenosis with neurogenic claudication 12/17/2015    ONSET DATE: 08/14/2022  REFERRING DIAG: R53.1 (ICD-10-CM) - Weakness R26.9 (ICD-10-CM) - Gait abnormality R20.2 (ICD-10-CM) - Paresthesia G62.9 (ICD-10-CM) - Neuropathy  THERAPY DIAG:  Unsteadiness on feet  Muscle weakness (generalized)  History of falling  Bilateral foot-drop  Rationale for Evaluation and Treatment: Rehabilitation  SUBJECTIVE:  SUBJECTIVE STATEMENT: Patient reports doing well. States he got fitted for B AFOs but won't receive them for another month. Denies falls since last visit. Does report gaining up to 11# in one evening and weighs himself every night.    Pt accompanied by: self  PERTINENT HISTORY: anxiety, OA, L TKA 05/2020, Hx of spinal surgery x4 (fused from T12 to S1), HTN and RA  PAIN:  Are you having pain? Yes: NPRS scale: 4-5/10 Pain location: BLEs  Pain description: Throbbing, can be a shooting pain almost like a vibration when pain is high  Aggravating factors: nothing, "it is what it is"  Relieving factors: prednisone has helped  hands and arms   PRECAUTIONS: Fall  PATIENT GOALS: "just getting better at balance"   OBJECTIVE:   DIAGNOSTIC FINDINGS: Nerve conduction study on 08/14/22:   Left sural sensory response was absent.  Left superficial peroneal sensory response showed mildly decreased snap amplitude.   Left median, ulnar, radial sensory responses showed prolonged peak latency with mildly to moderately decreased snap amplitude.   Left ulnar motor responses showed borderline prolonged distal latency, mildly decreased CMAP amplitude, 11 m/s velocity drop across left elbow.  Significantly prolonged left ulnar F-wave latency.   Left tibial motor responses also showed significantly decreased CMAP amplitude and F-wave latency   Left peroneal to EDB motor responses showed mildly decreased CMAP amplitude, moderately prolonged distal latency, and moderate slow conduction velocity,   Left median motor responses showed moderately prolonged distal latency, within normal range CMAP amplitude.   TODAY'S TREATMENT:       GAIT:  -56' with B AFO with noted improved heel contact, foot clearance  -remains with hip instability R>L  NMR:  -floor transfer x4 trialing R vs L powering up  -tall kneeling -> half kneeling transition  -scifit hills level 3 x 10 mins B UE/LE    PATIENT EDUCATION: Education details: Continue HEP, safe floor transfers Person educated: Patient Education method: Explanation, Demonstration, and Verbal cues Education comprehension: verbalized understanding  HOME EXERCISE PROGRAM:  Access Code: [email or web address] URL: [email or web address] Date: 09/02/2022 Prepared by: Deniece Ree  Exercises - Supine Bridge  - 2 x daily - 7 x weekly - 1 sets - 10 reps - 2 hold - Clamshell with Resistance  - 2 x daily - 7 x weekly - 1 sets - 10 reps - 2 hold - Sitting Knee Extension with Resistance  - 2 x daily - 7 x weekly - 1 sets - 10 reps - 2 hold - Tandem Stance  - 2 x daily - 7 x weekly - 1  sets - 3 reps - 15 hold - Standing Hip Extension with Counter Support  - 1 x daily - 7 x weekly - 2 sets - 10 reps - Standing Hip Abduction with Counter Support  - 1 x daily - 7 x weekly - 2 sets - 10 reps   GOALS: Goals reviewed with patient? Yes  SHORT TERM GOALS: Target date: 09/14/2022    Pt will be independent with initial HEP for improved strength, balance, transfers and gait.  Baseline: not established on eval  Goal status: MET  2.  Pt will improve 5 x STS to less than or equal to 24 seconds w/BUE support to demonstrate improved functional strength and transfer efficiency.   Baseline: 28.31s w/BUE support; 22.18s w/BUE support on 12/13 Goal status: MET  3.  Pt will improve gait velocity to at least 2.9 ft/s with LRAD and S* for improved gait efficiency  Baseline: 2.63 ft/s without AD and CGA; 2.63 ft/s w/no AD and CGA on 12/13 Goal status: IN PROGRESS  4.  Pt will trial various AFOs in clinic as well as ADs to determine the safest and most functional option for pt to continue to maintain independence  Baseline:  Goal status: MET  5.  Berg to be assessed and LTG written  Baseline:  Goal status: MET 11/29- berg 32    LONG TERM GOALS: Target date: 10/05/2022   Pt will be independent with final HEP for improved strength, balance, transfers and gait.  Baseline: provided Goal status: MET  2.  Pt will improve 5 x STS to less than or equal to 20 seconds wBUE support to demonstrate improved functional strength and transfer efficiency.   Baseline: 28.31s w/BUE support; 25.28s with B UE Goal status: IN PROGRESS  3.  Pt will obtain personal AFOs if deemed necessary for improved stability w/functional mobility, reduced fall risk and improved independence  Baseline: going next week Goal status: IN PROGRESS  4.  Will score at least 42 on Berg to show improved functional balance skills/reduced fall risk  Baseline: Berg 32 on 11/29; 24 Goal status: IN PROGRESS  5.  Pt  will improve gait velocity to at least 3.2 ft/s with LRAD mod I for improved gait efficiency   Baseline: 2.63 ft/s without AD and CGA; .36m/s or 2.29ft/s with supervision Goal status: IN PROGRESS  NEW LONG TERM GOALS: Target date: 11/11/2022   Pt will be independent with final HEP for improved strength, balance, transfers and gait.  Baseline: provided Goal status: MET  2.  Pt will improve 5 x STS to less than or equal to 20 seconds wBUE support to demonstrate improved functional strength and transfer efficiency.   Baseline: 25.28s with B UE Goal status: NEW  3.  Pt will obtain personal AFOs if deemed necessary for improved stability w/functional mobility, reduced fall risk and improved independence  Baseline: going next week Goal status: NEW  4.  Will score at least 32 on Berg to show improved functional balance skills/reduced fall risk  Baseline: Berg 24 Goal status: NEW  5.  Pt will improve gait velocity to at least 3.2 ft/s with LRAD mod I for improved gait efficiency   Baseline: .63m/s or 2.56ft/s with supervision Goal status: NEW    ASSESSMENT:  CLINICAL IMPRESSION: Patient seen for skilled PT session with emphasis on gait with AFOs and gross B LE NMR. Patient able to complete floor transfers with stand by assist on uneven surface to simulate work environment. Un-Velcroing B AFOs to allow for increased ankle ROM during floor transfer. Decreased B hip flexor strength noted with increased difficulty transition from tall kneeling to half kneeling needing to use UE to assist. Would benefit form continued proximal and core strengthening. Continue POC.   OBJECTIVE IMPAIRMENTS: Abnormal gait, decreased balance, decreased coordination, decreased knowledge of condition, decreased knowledge of use of DME, decreased mobility, difficulty walking, decreased strength, impaired sensation, and pain.   ACTIVITY LIMITATIONS: carrying, lifting, standing, squatting, stairs, transfers,  locomotion level, and caring for others  PARTICIPATION LIMITATIONS: laundry, community activity, occupation, and yard work  PERSONAL FACTORS: Past/current experiences and 1 comorbidity: significant bilateral neuropathy  are also affecting patient's functional outcome.   REHAB POTENTIAL: Good  CLINICAL DECISION MAKING: Stable/uncomplicated  EVALUATION COMPLEXITY: Low  PLAN:  PT FREQUENCY: 1x/week 1x a week  PT DURATION: 6 weeks 4 weeks  PLANNED INTERVENTIONS: Therapeutic exercises, Therapeutic activity, Neuromuscular re-education, Balance training,  Gait training, Patient/Family education, Self Care, Joint mobilization, Stair training, Vestibular training, Canalith repositioning, Orthotic/Fit training, DME instructions, Aquatic Therapy, Dry Needling, Electrical stimulation, Spinal mobilization, Cryotherapy, Moist heat, Taping, Manual therapy, and Re-evaluation  PLAN FOR NEXT SESSION: R hip strength, quad strength, core stability, endurance    Westley Foots, PT, DPT, CBIS 10/21/2022, 10:14 AM

## 2022-10-22 NOTE — Telephone Encounter (Signed)
I checked in with [facility] today and orders are processing still. Obtained signature today from Dr. Krista Blue on [employer] order today. Hopefully resolution will be reached soon and pt will be scheduled.

## 2022-10-23 ENCOUNTER — Encounter: Payer: BC Managed Care – PPO | Admitting: Neurology

## 2022-10-26 NOTE — Telephone Encounter (Signed)
I checked with [facility] on this. Order Electronics engineer is working on expediting. Pt notified as well.  Thanks!

## 2022-10-28 ENCOUNTER — Ambulatory Visit: Payer: BC Managed Care – PPO | Admitting: Physical Therapy

## 2022-10-28 VITALS — BP 143/90 | HR 88

## 2022-10-28 DIAGNOSIS — R2681 Unsteadiness on feet: Secondary | ICD-10-CM | POA: Diagnosis not present

## 2022-10-28 DIAGNOSIS — M6281 Muscle weakness (generalized): Secondary | ICD-10-CM

## 2022-10-28 DIAGNOSIS — Z9181 History of falling: Secondary | ICD-10-CM

## 2022-10-28 NOTE — Therapy (Signed)
OUTPATIENT PHYSICAL THERAPY NEURO TREATMENT   Patient Name: Gregory Hinton MRN: [id number] DOB:23-Apr-1966, 57 y.o., male Today's Date: 10/28/2022   PCP: Maggie Schwalbe, PA-C REFERRING PROVIDER: Marcial Pacas, MD  END OF SESSION:  PT End of Session - 10/28/22 0928     Visit Number 7    Number of Visits 11    Date for PT Re-Evaluation 11/11/22    Authorization Type BCBS    Progress Note Due on Visit 10    PT Start Time 0926    PT Stop Time 1013    PT Time Calculation (min) 47 min    Activity Tolerance Patient tolerated treatment well    Behavior During Therapy [facility] for tasks assessed/performed                Past Medical History:  Diagnosis Date   Adrenal insufficiency ([place])    Anemia    Anxiety    Arthritis    Back pain    had 3 fusions surgeries   H/O cardiovascular stress test 2014   admitted at [facility]. overnight for chest pain, stress/echo test wnl   Hypertension    PONV (postoperative nausea and vomiting)    Rheumatoid arthritis ([place])    manages by Dr. Estanislado Pandy, rec'd Orencia weekly for 2 months, switched from Humira    Past Surgical History:  Procedure Laterality Date   ANTERIOR LAT LUMBAR FUSION Left 12/26/2018   Procedure: Left Lumbar Two-Three Anterolateral decompression/interbody fusion/lateral plate;  Surgeon: Kristeen Miss, MD;  Location: [facility];  Service: Neurosurgery;  Laterality: Left;  Anterolateral   BACK SURGERY  [phone]   lumbar fusion 3 times   CARPAL TUNNEL RELEASE Right 2000   and left side 2 years ago with left ulnar surgery   COLONOSCOPY     HERNIA REPAIR Right 2001   inguinal    REPLACEMENT TOTAL KNEE Left 05/2020   Patient Active Problem List   Diagnosis Date Noted   CIDP (chronic inflammatory demyelinating polyneuropathy) ([place]) 09/21/2022   Bilateral foot-drop 09/16/2022   Unsteadiness on feet 09/16/2022   Neuropathy 08/14/2022   Weakness 08/03/2022   Gait abnormality 08/03/2022   Paresthesia 08/03/2022   Pneumonia  due to COVID-19 virus 10/08/2020   High risk medication use 04/19/2018   History of diverticulitis 05/04/2017   History of colon polyps 05/04/2017   Primary osteoarthritis of both hands 05/04/2017   Primary osteoarthritis of both feet 05/04/2017   Primary osteoarthritis of both knees 04/02/2017   Class 1 obesity without serious comorbidity with body mass index (BMI) of 32.0 to 32.9 in adult 10/14/2016   Spondylosis of lumbar region without myelopathy or radiculopathy 10/14/2016   DJD (degenerative joint disease), cervical 10/14/2016   High risk medications (not anticoagulants) long-term use 10/14/2016   Rheumatoid arthritis, seronegative, multiple sites ([place]) 10/14/2016   Lumbar stenosis with neurogenic claudication 12/17/2015    ONSET DATE: 08/14/2022  REFERRING DIAG: R53.1 (ICD-10-CM) - Weakness R26.9 (ICD-10-CM) - Gait abnormality R20.2 (ICD-10-CM) - Paresthesia G62.9 (ICD-10-CM) - Neuropathy  THERAPY DIAG:  Unsteadiness on feet  Muscle weakness (generalized)  History of falling  Rationale for Evaluation and Treatment: Rehabilitation  SUBJECTIVE:  SUBJECTIVE STATEMENT: Patient reports doing okay. Stopped taking Cymbalta a few days ago because he got to the point where he "could not function". Has had a few near misses since last session but no falls. Having a lot of pain today.    Pt accompanied by: self  PERTINENT HISTORY: anxiety, OA, L TKA 05/2020, Hx of spinal surgery x4 (fused from T12 to S1), HTN and RA  PAIN:  Are you having pain? Yes: NPRS scale: 8/10 Pain location: BLEs  Pain description: Throbbing, can be a shooting pain almost like a vibration when pain is high  Aggravating factors: nothing, "it is what it is"  Relieving factors: prednisone has helped hands and arms   VITALS   Vitals:   10/28/22 0933  BP: (!) 143/90  Pulse: 88     PRECAUTIONS: Fall  PATIENT GOALS: "just getting better at balance"   OBJECTIVE:   DIAGNOSTIC FINDINGS: Nerve conduction study on 08/14/22:   Left sural sensory response was absent.  Left superficial peroneal sensory response showed mildly decreased snap amplitude.   Left median, ulnar, radial sensory responses showed prolonged peak latency with mildly to moderately decreased snap amplitude.   Left ulnar motor responses showed borderline prolonged distal latency, mildly decreased CMAP amplitude, 11 m/s velocity drop across left elbow.  Significantly prolonged left ulnar F-wave latency.   Left tibial motor responses also showed significantly decreased CMAP amplitude and F-wave latency   Left peroneal to EDB motor responses showed mildly decreased CMAP amplitude, moderately prolonged distal latency, and moderate slow conduction velocity,   Left median motor responses showed moderately prolonged distal latency, within normal range CMAP amplitude.   TODAY'S TREATMENT:       Ther Act  Assessed BP (see above) and BP elevated but within limits for PT   Ther Ex  SciFit multi-peaks level 8 for 8 minutes using BUE/BLEs for neural priming for reciprocal movement, dynamic cardiovascular warmup and global strength. RPE of 7/10 following activity  Seated march overs using 12# KB for improved hip flexor/abductor strength and core stability, x10 per side. Increased difficulty performing on L side >R side and increased difficulty w/abduction compared to adduction. RPE of 7-8/10 following activity.  Supine bridges x10 for improved posterior chain strength. Min cues to maintain breathing throughout. Pt able to increase ROM w/each rep.  Progressed to single leg bridges, x6 per side, for isolated hip and hamstring strength. Pt unable to lift non-weightbearing leg, but performed well  Seated pallof presses using green theraband, x8 reps per side.  Increased difficulty performing on R side > L side  Mod plantigrade bird dogs, x8 per side, for improved posterior chain strength and core stability. Pt stabilized well and had most difficulty with reciprocal coordination.    PATIENT EDUCATION: Education details: Continue HEP Person educated: Patient Education method: Explanation, Demonstration, and Verbal cues Education comprehension: verbalized understanding  HOME EXERCISE PROGRAM:  Access Code: [email or web address] URL: [email or web address] Date: 09/02/2022 Prepared by: Nedra Hai  Exercises - Supine Bridge  - 2 x daily - 7 x weekly - 1 sets - 10 reps - 2 hold - Clamshell with Resistance  - 2 x daily - 7 x weekly - 1 sets - 10 reps - 2 hold - Sitting Knee Extension with Resistance  - 2 x daily - 7 x weekly - 1 sets - 10 reps - 2 hold - Tandem Stance  - 2 x daily - 7 x weekly - 1 sets - 3 reps - 15 hold - Standing  Hip Extension with Counter Support  - 1 x daily - 7 x weekly - 2 sets - 10 reps - Standing Hip Abduction with Counter Support  - 1 x daily - 7 x weekly - 2 sets - 10 reps   GOALS: Goals reviewed with patient? Yes  NEW LONG TERM GOALS: Target date: 11/11/2022   Pt will be independent with final HEP for improved strength, balance, transfers and gait.  Baseline: provided Goal status: MET  2.  Pt will improve 5 x STS to less than or equal to 20 seconds wBUE support to demonstrate improved functional strength and transfer efficiency.   Baseline: 25.28s with B UE Goal status: NEW  3.  Pt will obtain personal AFOs if deemed necessary for improved stability w/functional mobility, reduced fall risk and improved independence  Baseline: going next week Goal status: NEW  4.  Will score at least 32 on Berg to show improved functional balance skills/reduced fall risk  Baseline: Berg 24 Goal status: NEW  5.  Pt will improve gait velocity to at least 3.2 ft/s with LRAD mod I for improved gait efficiency   Baseline:  .9m/s or 2.77ft/s with supervision Goal status: NEW   ASSESSMENT:  CLINICAL IMPRESSION: Emphasis of skilled PT session on BLE strength, core stability and single leg stability. Pt tolerated session well despite having high levels of pain due to medication change. Pt had greatest difficulty w/seated march overs and glute bridges, but improved form w/added practice. Pt will benefit from continued core and posterior chain strengthening. Continue POC.   OBJECTIVE IMPAIRMENTS: Abnormal gait, decreased balance, decreased coordination, decreased knowledge of condition, decreased knowledge of use of DME, decreased mobility, difficulty walking, decreased strength, impaired sensation, and pain.   ACTIVITY LIMITATIONS: carrying, lifting, standing, squatting, stairs, transfers, locomotion level, and caring for others  PARTICIPATION LIMITATIONS: laundry, community activity, occupation, and yard work  PERSONAL FACTORS: Past/current experiences and 1 comorbidity: significant bilateral neuropathy  are also affecting patient's functional outcome.   REHAB POTENTIAL: Good  CLINICAL DECISION MAKING: Stable/uncomplicated  EVALUATION COMPLEXITY: Low  PLAN:  PT FREQUENCY: 1x/week 1x a week  PT DURATION: 6 weeks 4 weeks  PLANNED INTERVENTIONS: Therapeutic exercises, Therapeutic activity, Neuromuscular re-education, Balance training, Gait training, Patient/Family education, Self Care, Joint mobilization, Stair training, Vestibular training, Canalith repositioning, Orthotic/Fit training, DME instructions, Aquatic Therapy, Dry Needling, Electrical stimulation, Spinal mobilization, Cryotherapy, Moist heat, Taping, Manual therapy, and Re-evaluation  PLAN FOR NEXT SESSION: R hip strength, quad strength, core stability, endurance    Cruzita Lederer Ambry Dix, PT, DPT [facility] [address] [place], [place]  [address] Phone:  [phone] Fax:  [phone] 10/28/2022, 10:15 AM

## 2022-11-02 NOTE — Telephone Encounter (Signed)
I checked with [facility]fusion again today Gregory Hinton ). New information needed for PA. No new developments as of today.

## 2022-11-04 ENCOUNTER — Ambulatory Visit: Payer: BC Managed Care – PPO

## 2022-11-04 DIAGNOSIS — R2681 Unsteadiness on feet: Secondary | ICD-10-CM

## 2022-11-04 DIAGNOSIS — M21371 Foot drop, right foot: Secondary | ICD-10-CM

## 2022-11-04 DIAGNOSIS — M6281 Muscle weakness (generalized): Secondary | ICD-10-CM

## 2022-11-04 DIAGNOSIS — Z9181 History of falling: Secondary | ICD-10-CM

## 2022-11-04 NOTE — Therapy (Signed)
OUTPATIENT PHYSICAL THERAPY NEURO TREATMENT   Patient Name: Gregory Hinton MRN: [id number] DOB:1966-08-04, 57 y.o., male Today's Date: 11/04/2022   PCP: Maggie Schwalbe, PA-C REFERRING PROVIDER: Marcial Pacas, MD  END OF SESSION:  PT End of Session - 11/04/22 0936     Visit Number 8    Number of Visits 11    Date for PT Re-Evaluation 11/11/22    Authorization Type BCBS    Progress Note Due on Visit 10    PT Start Time 0934    PT Stop Time 1012    PT Time Calculation (min) 38 min    Equipment Utilized During Treatment Gait belt    Activity Tolerance Patient limited by pain;Patient tolerated treatment well                Past Medical History:  Diagnosis Date   Adrenal insufficiency ([place])    Anemia    Anxiety    Arthritis    Back pain    had 3 fusions surgeries   H/O cardiovascular stress test 2014   admitted at [facility]. overnight for chest pain, stress/echo test wnl   Hypertension    PONV (postoperative nausea and vomiting)    Rheumatoid arthritis ([place])    manages by Dr. Estanislado Pandy, rec'd Orencia weekly for 2 months, switched from Humira    Past Surgical History:  Procedure Laterality Date   ANTERIOR LAT LUMBAR FUSION Left 12/26/2018   Procedure: Left Lumbar Two-Three Anterolateral decompression/interbody fusion/lateral plate;  Surgeon: Kristeen Miss, MD;  Location: [facility];  Service: Neurosurgery;  Laterality: Left;  Anterolateral   BACK SURGERY  [phone]   lumbar fusion 3 times   CARPAL TUNNEL RELEASE Right 2000   and left side 2 years ago with left ulnar surgery   COLONOSCOPY     HERNIA REPAIR Right 2001   inguinal    REPLACEMENT TOTAL KNEE Left 05/2020   Patient Active Problem List   Diagnosis Date Noted   CIDP (chronic inflammatory demyelinating polyneuropathy) ([place]) 09/21/2022   Bilateral foot-drop 09/16/2022   Unsteadiness on feet 09/16/2022   Neuropathy 08/14/2022   Weakness 08/03/2022   Gait abnormality 08/03/2022   Paresthesia 08/03/2022    Pneumonia due to COVID-19 virus 10/08/2020   High risk medication use 04/19/2018   History of diverticulitis 05/04/2017   History of colon polyps 05/04/2017   Primary osteoarthritis of both hands 05/04/2017   Primary osteoarthritis of both feet 05/04/2017   Primary osteoarthritis of both knees 04/02/2017   Class 1 obesity without serious comorbidity with body mass index (BMI) of 32.0 to 32.9 in adult 10/14/2016   Spondylosis of lumbar region without myelopathy or radiculopathy 10/14/2016   DJD (degenerative joint disease), cervical 10/14/2016   High risk medications (not anticoagulants) long-term use 10/14/2016   Rheumatoid arthritis, seronegative, multiple sites ([place]) 10/14/2016   Lumbar stenosis with neurogenic claudication 12/17/2015    ONSET DATE: 08/14/2022  REFERRING DIAG: R53.1 (ICD-10-CM) - Weakness R26.9 (ICD-10-CM) - Gait abnormality R20.2 (ICD-10-CM) - Paresthesia G62.9 (ICD-10-CM) - Neuropathy  THERAPY DIAG:  Unsteadiness on feet  Muscle weakness (generalized)  History of falling  Bilateral foot-drop  Rationale for Evaluation and Treatment: Rehabilitation  SUBJECTIVE:  SUBJECTIVE STATEMENT: Patient reports doing fair. Did have a fall on Sunday where he "hit concrete" and bruised L side of his ribs. Reports he's "purple and green and sore." Did hear from [facility] regarding braces and they should be ready next week.    Pt accompanied by: self  PERTINENT HISTORY: anxiety, OA, L TKA 05/2020, Hx of spinal surgery x4 (fused from T12 to S1), HTN and RA  PAIN:  Are you having pain? Yes: NPRS scale: 8/10 Pain location: BLEs  Pain description: Throbbing, can be a shooting pain almost like a vibration when pain is high  Aggravating factors: nothing, "it is what it is"  Relieving factors:  prednisone has helped hands and arms   VITALS  134/81; HR: 80 BPM  PRECAUTIONS: Fall  PATIENT GOALS: "just getting better at balance"   OBJECTIVE:   DIAGNOSTIC FINDINGS: Nerve conduction study on 08/14/22:   Left sural sensory response was absent.  Left superficial peroneal sensory response showed mildly decreased snap amplitude.   Left median, ulnar, radial sensory responses showed prolonged peak latency with mildly to moderately decreased snap amplitude.   Left ulnar motor responses showed borderline prolonged distal latency, mildly decreased CMAP amplitude, 11 m/s velocity drop across left elbow.  Significantly prolonged left ulnar F-wave latency.   Left tibial motor responses also showed significantly decreased CMAP amplitude and F-wave latency   Left peroneal to EDB motor responses showed mildly decreased CMAP amplitude, moderately prolonged distal latency, and moderate slow conduction velocity,   Left median motor responses showed moderately prolonged distal latency, within normal range CMAP amplitude.   TODAY'S TREATMENT:       NMR: Nustep level 4 x 10 mins B UE/LE  Wobble board seated ankle AROM, coordination CW/CCW  Sit <> stand on airex no UE x8 with increased LOB to the R noted   PATIENT EDUCATION: Education details: Continue HEP, conservative management for rib pain Person educated: Patient Education method: Explanation, Demonstration, and Verbal cues Education comprehension: verbalized understanding  HOME EXERCISE PROGRAM:  Access Code: [email or web address] URL: [email or web address] Date: 09/02/2022 Prepared by: Deniece Ree  Exercises - Supine Bridge  - 2 x daily - 7 x weekly - 1 sets - 10 reps - 2 hold - Clamshell with Resistance  - 2 x daily - 7 x weekly - 1 sets - 10 reps - 2 hold - Sitting Knee Extension with Resistance  - 2 x daily - 7 x weekly - 1 sets - 10 reps - 2 hold - Tandem Stance  - 2 x daily - 7 x weekly - 1 sets - 3 reps - 15 hold -  Standing Hip Extension with Counter Support  - 1 x daily - 7 x weekly - 2 sets - 10 reps - Standing Hip Abduction with Counter Support  - 1 x daily - 7 x weekly - 2 sets - 10 reps   GOALS: Goals reviewed with patient? Yes  NEW LONG TERM GOALS: Target date: 11/11/2022   Pt will be independent with final HEP for improved strength, balance, transfers and gait.  Baseline: provided Goal status: MET  2.  Pt will improve 5 x STS to less than or equal to 20 seconds wBUE support to demonstrate improved functional strength and transfer efficiency.   Baseline: 25.28s with B UE Goal status: NEW  3.  Pt will obtain personal AFOs if deemed necessary for improved stability w/functional mobility, reduced fall risk and improved independence  Baseline: going next week Goal status: NEW  4.  Will score at least 32 on Berg to show improved functional balance skills/reduced fall risk  Baseline: Berg 24 Goal status: NEW  5.  Pt will improve gait velocity to at least 3.2 ft/s with LRAD mod I for improved gait efficiency   Baseline: .53m/s or 2.9ft/s with supervision Goal status: NEW   ASSESSMENT:  CLINICAL IMPRESSION: Patient seen for skilled PT session with emphasis on functional NMR. Session limited by patients rib pain related to recent fall. Extensive conversation regarding importance of deep breathing and non-pharmacological interventions for pain management. Patient verbalized understanding. Improving general endurance. Continues to have some LOB, R >L on compliant surfaces. Continue POC.   OBJECTIVE IMPAIRMENTS: Abnormal gait, decreased balance, decreased coordination, decreased knowledge of condition, decreased knowledge of use of DME, decreased mobility, difficulty walking, decreased strength, impaired sensation, and pain.   ACTIVITY LIMITATIONS: carrying, lifting, standing, squatting, stairs, transfers, locomotion level, and caring for others  PARTICIPATION LIMITATIONS: laundry, community  activity, occupation, and yard work  PERSONAL FACTORS: Past/current experiences and 1 comorbidity: significant bilateral neuropathy  are also affecting patient's functional outcome.   REHAB POTENTIAL: Good  CLINICAL DECISION MAKING: Stable/uncomplicated  EVALUATION COMPLEXITY: Low  PLAN:  PT FREQUENCY: 1x/week 1x a week  PT DURATION: 6 weeks 4 weeks  PLANNED INTERVENTIONS: Therapeutic exercises, Therapeutic activity, Neuromuscular re-education, Balance training, Gait training, Patient/Family education, Self Care, Joint mobilization, Stair training, Vestibular training, Canalith repositioning, Orthotic/Fit training, DME instructions, Aquatic Therapy, Dry Needling, Electrical stimulation, Spinal mobilization, Cryotherapy, Moist heat, Taping, Manual therapy, and Re-evaluation  PLAN FOR NEXT SESSION: get AFOs?, DC?   Debbora Dus, PT, DPT, CBIS 11/04/2022, 10:13 AM

## 2022-11-11 ENCOUNTER — Ambulatory Visit: Payer: BC Managed Care – PPO | Attending: Neurology

## 2022-11-11 DIAGNOSIS — R2681 Unsteadiness on feet: Secondary | ICD-10-CM | POA: Diagnosis present

## 2022-11-11 DIAGNOSIS — M6281 Muscle weakness (generalized): Secondary | ICD-10-CM | POA: Insufficient documentation

## 2022-11-11 NOTE — Therapy (Signed)
OUTPATIENT PHYSICAL THERAPY NEURO TREATMENT   Patient Name: KENRIC GINGER MRN: [id number] DOB:1966-03-19, 57 y.o., male Today's Date: 11/11/2022   PCP: Maggie Schwalbe, PA-C REFERRING PROVIDER: Marcial Pacas, MD   PHYSICAL THERAPY DISCHARGE SUMMARY  Visits from Start of Care: 9  Current functional level related to goals / functional outcomes: See below   Remaining deficits: See below   Education / Equipment: PT POC, HEP, use of AFOs   Patient agrees to discharge. Patient goals were partially met. Patient is being discharged due to being pleased with the current functional level.  END OF SESSION:  PT End of Session - 11/11/22 0928     Visit Number 9    Number of Visits 11    Date for PT Re-Evaluation 11/11/22    Authorization Type BCBS    Progress Note Due on Visit 10    PT Start Time 0931    PT Stop Time 0945   discharge   PT Time Calculation (min) 14 min    Activity Tolerance Patient limited by pain;Patient tolerated treatment well    Behavior During Therapy [facility] for tasks assessed/performed             Past Medical History:  Diagnosis Date   Adrenal insufficiency ([place])    Anemia    Anxiety    Arthritis    Back pain    had 3 fusions surgeries   H/O cardiovascular stress test 2014   admitted at [facility]. overnight for chest pain, stress/echo test wnl   Hypertension    PONV (postoperative nausea and vomiting)    Rheumatoid arthritis ([place])    manages by Dr. Estanislado Pandy, rec'd Orencia weekly for 2 months, switched from Humira    Past Surgical History:  Procedure Laterality Date   ANTERIOR LAT LUMBAR FUSION Left 12/26/2018   Procedure: Left Lumbar Two-Three Anterolateral decompression/interbody fusion/lateral plate;  Surgeon: Kristeen Miss, MD;  Location: [facility];  Service: Neurosurgery;  Laterality: Left;  Anterolateral   BACK SURGERY  [phone]   lumbar fusion 3 times   CARPAL TUNNEL RELEASE Right 2000   and left side 2 years ago with left ulnar surgery    COLONOSCOPY     HERNIA REPAIR Right 2001   inguinal    REPLACEMENT TOTAL KNEE Left 05/2020   Patient Active Problem List   Diagnosis Date Noted   CIDP (chronic inflammatory demyelinating polyneuropathy) ([place]) 09/21/2022   Bilateral foot-drop 09/16/2022   Unsteadiness on feet 09/16/2022   Neuropathy 08/14/2022   Weakness 08/03/2022   Gait abnormality 08/03/2022   Paresthesia 08/03/2022   Pneumonia due to COVID-19 virus 10/08/2020   High risk medication use 04/19/2018   History of diverticulitis 05/04/2017   History of colon polyps 05/04/2017   Primary osteoarthritis of both hands 05/04/2017   Primary osteoarthritis of both feet 05/04/2017   Primary osteoarthritis of both knees 04/02/2017   Class 1 obesity without serious comorbidity with body mass index (BMI) of 32.0 to 32.9 in adult 10/14/2016   Spondylosis of lumbar region without myelopathy or radiculopathy 10/14/2016   DJD (degenerative joint disease), cervical 10/14/2016   High risk medications (not anticoagulants) long-term use 10/14/2016   Rheumatoid arthritis, seronegative, multiple sites ([place]) 10/14/2016   Lumbar stenosis with neurogenic claudication 12/17/2015    ONSET DATE: 08/14/2022  REFERRING DIAG: R53.1 (ICD-10-CM) - Weakness R26.9 (ICD-10-CM) - Gait abnormality R20.2 (ICD-10-CM) - Paresthesia G62.9 (ICD-10-CM) - Neuropathy  THERAPY DIAG:  Unsteadiness on feet  Muscle weakness (generalized)  Rationale for Evaluation  and Treatment: Rehabilitation  SUBJECTIVE:                                                                                                                                                                                             SUBJECTIVE STATEMENT: Patient reports doing well. Got AFOs yesterday and they're doing well. Denies falls/ near falls. Did get IVIG approved and should start next week.    Pt accompanied by: self  PERTINENT HISTORY: anxiety, OA, L TKA 05/2020, Hx of spinal surgery  x4 (fused from T12 to S1), HTN and RA  PAIN:  Are you having pain? Yes: NPRS scale: 8/10 Pain location: BLEs  Pain description: Throbbing, can be a shooting pain almost like a vibration when pain is high  Aggravating factors: nothing, "it is what it is"  Relieving factors: prednisone has helped hands and arms   VITALS  134/81; HR: 80 BPM  PRECAUTIONS: Fall  PATIENT GOALS: "just getting better at balance"   OBJECTIVE:   DIAGNOSTIC FINDINGS: Nerve conduction study on 08/14/22:   Left sural sensory response was absent.  Left superficial peroneal sensory response showed mildly decreased snap amplitude.   Left median, ulnar, radial sensory responses showed prolonged peak latency with mildly to moderately decreased snap amplitude.   Left ulnar motor responses showed borderline prolonged distal latency, mildly decreased CMAP amplitude, 11 m/s velocity drop across left elbow.  Significantly prolonged left ulnar F-wave latency.   Left tibial motor responses also showed significantly decreased CMAP amplitude and F-wave latency   Left peroneal to EDB motor responses showed mildly decreased CMAP amplitude, moderately prolonged distal latency, and moderate slow conduction velocity,   Left median motor responses showed moderately prolonged distal latency, within normal range CMAP amplitude.   TODAY'S TREATMENT:       Theract:  [facility] PT Assessment - 11/11/22 0001       Standardized Balance Assessment   Five times sit to stand comments  24.28s    10 Meter Walk .87m/s or 3.16ft/s      Berg Balance Test   Sit to Stand Able to stand without using hands and stabilize independently    Standing Unsupported Able to stand safely 2 minutes    Sitting with Back Unsupported but Feet Supported on Floor or Stool Able to sit safely and securely 2 minutes    Stand to Sit Sits safely with minimal use of hands    Transfers Able to transfer safely, minor use of hands    Standing Unsupported with Eyes  Closed Able to stand 10 seconds with supervision    Standing Unsupported with Feet Together Able to  place feet together independently and stand for 1 minute with supervision    From Standing, Reach Forward with Outstretched Arm Reaches forward but needs supervision    From Standing Position, Pick up Object from Floor Unable to pick up and needs supervision    From Standing Position, Turn to Look Behind Over each Shoulder Needs supervision when turning    Turn 360 Degrees Needs close supervision or verbal cueing    Standing Unsupported, Alternately Place Feet on Step/Stool Able to complete >2 steps/needs minimal assist    Standing Unsupported, One Foot in [place] help to step but can hold 15 seconds    Standing on One Leg Tries to lift leg/unable to hold 3 seconds but remains standing independently    Total Score 33             PATIENT EDUCATION: Education details: exam findings, PT POC Person educated: Patient Education method: Explanation, Demonstration, and Verbal cues Education comprehension: verbalized understanding  HOME EXERCISE PROGRAM:  Access Code: [email or web address] URL: [email or web address] Date: 09/02/2022 Prepared by: Deniece Ree  Exercises - Supine Bridge  - 2 x daily - 7 x weekly - 1 sets - 10 reps - 2 hold - Clamshell with Resistance  - 2 x daily - 7 x weekly - 1 sets - 10 reps - 2 hold - Sitting Knee Extension with Resistance  - 2 x daily - 7 x weekly - 1 sets - 10 reps - 2 hold - Tandem Stance  - 2 x daily - 7 x weekly - 1 sets - 3 reps - 15 hold - Standing Hip Extension with Counter Support  - 1 x daily - 7 x weekly - 2 sets - 10 reps - Standing Hip Abduction with Counter Support  - 1 x daily - 7 x weekly - 2 sets - 10 reps   GOALS: Goals reviewed with patient? Yes  NEW LONG TERM GOALS: Target date: 11/11/2022   Pt will be independent with final HEP for improved strength, balance, transfers and gait.  Baseline: provided Goal status:  MET  2.  Pt will improve 5 x STS to less than or equal to 20 seconds wBUE support to demonstrate improved functional strength and transfer efficiency.   Baseline: 25.28s with B UE; 24.38s with B UE Goal status: NOT MET  3.  Pt will obtain personal AFOs if deemed necessary for improved stability w/functional mobility, reduced fall risk and improved independence  Baseline: going next week; obtained Goal status: MET  4.  Will score at least 32 on Berg to show improved functional balance skills/reduced fall risk  Baseline: Berg 24; 33/56 Goal status: MET  5.  Pt will improve gait velocity to at least 3.2 ft/s with LRAD mod I for improved gait efficiency   Baseline: .44m/s or 2.22ft/s with supervision; .4m/s or 3.60ft/s Goal status: NOT MET   ASSESSMENT:  CLINICAL IMPRESSION: Patient seen for skilled PT session with emphasis on goal assessment and discharge. He met 3/5 LTG and made good progress toward remaining goals. Five times Sit to Stand Test (FTSS) Method: Use a straight back chair with a solid seat that is 17-18" high. Ask participant to sit on the chair with arms folded across their chest.   Instructions: "Stand up and sit down as quickly as possible 5 times, keeping your arms folded across your chest."   Measurement: Stop timing when the participant touches the chair in sitting the 5th time.  TIME: 24.38 sec with B UE (limited  by rib pain)   Cut off scores indicative of increased fall risk: >12 sec CVA, >16 sec PD, >13 sec vestibular (ANPTA Core Set of Outcome Measures for Adults with Neurologic Conditions, 2018). 10 Meter Walk Test: Patient instructed to walk 10 meters (32.8 ft) as quickly and as safely as possible at their normal speed x2 and at a fast speed x2. Time measured from 2 meter mark to 8 meter mark to accommodate ramp-up and ramp-down.  Normal speed: .58m/s or 3.58ft/s Cut off scores: <0.4 m/s = household Ambulator, 0.4-0.8 m/s = limited community Ambulator,  >0.8 m/s = community Ambulator, >1.2 m/s = crossing a street, <1.0 = increased fall risk MCID 0.05 m/s (small), 0.13 m/s (moderate), 0.06 m/s (significant)  (ANPTA Core Set of Outcome Measures for Adults with Neurologic Conditions, 2018). Patient demonstrated increased fall risk noted by score of 33/56 on the [facility] Scale.  <45/56 = fall risk, <42/56 = predictive of recurrent falls, <40/56 = 100% fall risk  >41 = independent, 21-40 = assistive device, 0-20 = wheelchair level  MDC 6.9 (4 pts 45-56, 5 pts 35-44, 7 pts 25-34) (ANPTA Core Set of Outcome Measures for Adults with Neurologic Conditions, 2018). Patient to dc from PT at this time.   OBJECTIVE IMPAIRMENTS: Abnormal gait, decreased balance, decreased coordination, decreased knowledge of condition, decreased knowledge of use of DME, decreased mobility, difficulty walking, decreased strength, impaired sensation, and pain.   ACTIVITY LIMITATIONS: carrying, lifting, standing, squatting, stairs, transfers, locomotion level, and caring for others  PARTICIPATION LIMITATIONS: laundry, community activity, occupation, and yard work  PERSONAL FACTORS: Past/current experiences and 1 comorbidity: significant bilateral neuropathy  are also affecting patient's functional outcome.   REHAB POTENTIAL: Good  CLINICAL DECISION MAKING: Stable/uncomplicated  EVALUATION COMPLEXITY: Low  PLAN:  PT FREQUENCY: 1x/week 1x a week  PT DURATION: 6 weeks 4 weeks  PLANNED INTERVENTIONS: Therapeutic exercises, Therapeutic activity, Neuromuscular re-education, Balance training, Gait training, Patient/Family education, Self Care, Joint mobilization, Stair training, Vestibular training, Canalith repositioning, Orthotic/Fit training, DME instructions, Aquatic Therapy, Dry Needling, Electrical stimulation, Spinal mobilization, Cryotherapy, Moist heat, Taping, Manual therapy, and Re-evaluation  PLAN FOR NEXT SESSION: dc from PT   Westley Foots, PT, DPT,  CBIS 11/11/2022, 9:52 AM

## 2023-01-09 ENCOUNTER — Encounter: Payer: Self-pay | Admitting: Neurology

## 2023-01-20 ENCOUNTER — Other Ambulatory Visit: Payer: Self-pay | Admitting: Neurology

## 2023-01-20 MED ORDER — PREDNISONE 10 MG PO TABS: ORAL_TABLET | ORAL | 5 refills | Status: DC

## 2023-01-20 NOTE — Telephone Encounter (Signed)
Meds ordered this encounter  Medications   predniSONE (DELTASONE) 10 MG tablet    Sig: Continue tapering down prednisone dosage as previously instructed.    Dispense:  90 tablet    Refill:  5

## 2023-02-03 ENCOUNTER — Telehealth: Payer: Self-pay | Admitting: Neurology

## 2023-02-03 ENCOUNTER — Encounter: Payer: Self-pay | Admitting: Neurology

## 2023-02-03 ENCOUNTER — Ambulatory Visit (INDEPENDENT_AMBULATORY_CARE_PROVIDER_SITE_OTHER): Payer: BC Managed Care – PPO | Admitting: Neurology

## 2023-02-03 VITALS — BP 138/80 | HR 86 | Ht 76.0 in | Wt 303.0 lb

## 2023-02-03 DIAGNOSIS — W19XXXA Unspecified fall, initial encounter: Secondary | ICD-10-CM | POA: Insufficient documentation

## 2023-02-03 DIAGNOSIS — R32 Unspecified urinary incontinence: Secondary | ICD-10-CM | POA: Diagnosis not present

## 2023-02-03 DIAGNOSIS — G6181 Chronic inflammatory demyelinating polyneuritis: Secondary | ICD-10-CM | POA: Diagnosis not present

## 2023-02-03 DIAGNOSIS — R269 Unspecified abnormalities of gait and mobility: Secondary | ICD-10-CM | POA: Diagnosis not present

## 2023-02-03 DIAGNOSIS — W19XXXS Unspecified fall, sequela: Secondary | ICD-10-CM

## 2023-02-03 MED ORDER — GABAPENTIN 300 MG PO CAPS: 900.0000 mg | ORAL_CAPSULE | Freq: Every day | ORAL | 11 refills | Status: DC

## 2023-02-03 MED ORDER — DULOXETINE HCL 60 MG PO CPEP: 60.0000 mg | ORAL_CAPSULE | Freq: Every day | ORAL | 3 refills | Status: DC

## 2023-02-03 NOTE — Telephone Encounter (Signed)
[place] Numbers: [id number] exp. 02/03/23-03/04/23 sent to [facility] [phone]

## 2023-02-03 NOTE — Progress Notes (Signed)
ASSESSMENT AND PLAN  Gregory Hinton is a 57 y.o. 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, areflexia, distal weakness, Started prednisone 60 mg daily treatment since November 2023, f 10 mg decrement every month, Spinal fluid in December 2023, total protein of 142   IVIG preauthorization 2 g/kg as loading dose since February 2024, followed by 1 g/kg maintenance dose, showed mild improvement   Keep Cymbalta 60 mg Add on gabapentin 300 mg 3 times daily  New onset worsening urinary incontinence, given bowel incontinence,  Today's examination showed worsening bilateral upper extremity weakness, especially bilateral wrist flexion extension and grip weakness, brisk upper extremity reflex,  MRI of cervical spine w/wo to rule out spondylitic myelopathy  DIAGNOSTIC DATA (LABS, IMAGING, TESTING) - I reviewed patient records, labs, notes, testing and imaging myself where available. MRI of thoracic spine September 2023, no significant canal foraminal narrowing MRI of cervical spine, mild degenerative changes, no evidence of canal stenosis, variable degree of foraminal narrowing  MEDICAL HISTORY:  Gregory Hinton is a 57 year old male, seen in request by neurosurgeon neurosurgeon Dr. Barnett Abu, for evaluation of gradual onset arm and leg weakness, his primary care is at [facility], [place], initial evaluation August 03, 2022  I reviewed and summarized the referring note. PMHX. Adrenal insufficiency HTN Anxiety Rheumatoid arthritis Lumbar decompression x3, last was in 2022,  CTS release bilateral Left ulnar decompression in 2017.  Patient was diagnosed with adrenal insufficiency around 2016," felt terrible" then, lack of energy, laboratory evaluation confirmed adrenal insufficiency, he has been treated  with prednisone 5 to 10 mg daily, does make a difference, currently taking 5 mg twice a day  Also had longstanding history of rheumatoid arthritis, multiple joints pain, moderate to severe bilateral shoulder pain, multiple hand joints pain,   He works as a Cabin crew, including heavy lifting, working on Sales promotion account executive  Since January 2023, he noticed bilateral foot numbness tingling, as if walking on cottons, gradually getting worse, around spring 2023, he also noticed bilateral arm and leg weakness, need push-up to get up from seated position, hard to get around in his workshop, fell few times,  He had 3 lumbar decompression surgery in the past, personally reviewed MRI lumbar spine September 2022, interval L2-3 fusion with moderate residual spinal stenosis, new adjacent segment disease at L1-2 with severe spinal stenosis and moderate neuroforaminal stenosis  He since underwent posterior decompression L1 to with laminectomy and posterior lumbar interbody arthrodesis, segmental fixation L1-4, by Dr. 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,  Patient complains of slow onset, progressive worsening gait abnormality over past 1 year, to the point of at high risk for fall, difficult to get around at his workshop, also complains of deep body achy pain  Reviewed extensive laboratory evaluation October 2023, normal negative ESR C-reactive protein B12, RPR, HIV, ANA, thyroid panel, acetylcholine receptor antibody, protein electrophoresis,  Lipid  panel LDL 98, A1c 5.7  UPDATE Feb 03 2023: He was started with IVIG around February 2024 tolerating it well, did notice some improvement, less numbness, more" awakening" bilateral feet numb, tingling, difficulty falling to sleep, Cymbalta 60 mg every morning help his symptoms some  Spinal fluid testing December 2023 showed total protein of 142  He is on tapering dose of prednisone, intended to be 10 mg decrement every month, he is adjusting decrement rate based on his feeling, aiming to be 10 mg daily at next visit in 3 months, he does complains side effect of water retention, moon face weight gain, moodiness  He fell few times, over the past 6 months, has worsening urinary urgency, incontinence, even lost control of bowel movement, have accidents sometimes, also noticed increased bilateral hands numbness tingling, and weakness  Personally reviewed MRI of cervical spine, September 2023, multilevel degenerative changes, no significant canal foraminal narrowing  PHYSICAL EXAM:    118/65, 71  PHYSICAL EXAMNIATION:  Gen: NAD, conversant, well nourised, well groomed                     Cardiovascular: Regular rate rhythm, no peripheral edema, warm, nontender. Eyes: Conjunctivae clear without exudates or hemorrhage Neck: Supple, no carotid bruits. Pulmonary: Clear to auscultation bilaterally   NEUROLOGICAL EXAM:  MENTAL STATUS: Speech/cognition: Awake, alert, oriented to history taking and casual conversation CRANIAL NERVES: CN II: Visual fields are full to confrontation. Pupils are round equal and briskly reactive to light. CN III, IV, VI: extraocular movement are normal. No ptosis.  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 CN IX, X: Phonation is normal. CN XI: Head  turning and shoulder shrug are intact  MOTOR:  No neck flexion weakness,  mild bilateral shoulder abduction, external rotation weakness, mild to moderate elbow flexion, wrist flexion extension, grip weakness,  Mild  bilateral hip flexion weakness, ankle dorsiflexion weakness, toe flexion extension weakness, right worse than left,  REFLEXES: Brisk bilateral brachioradialis, biceps, triceps, absent lower extremity reflex, toes were mute  SENSORY: Length-dependent decreased light touch, pinprick to the knee level, decreased to pinprick to below elbow level  COORDINATION: There is no trunk or limb dysmetria noted.  GAIT/STANCE: Need to push-up to get up from seated position, wide-based, unsteady, could not stand up on tiptoes, heels, positive Romberg signs  REVIEW OF SYSTEMS:  Full 14 system review of systems performed and notable only for as above All other review of systems were negative.   ALLERGIES: Allergies  Allergen Reactions   Atorvastatin     Other Reaction(s): Myalgias, Myalgias (intolerance)   Codeine Nausea Only    Other Reaction(s): GI Intolerance   Egg-Derived Products Diarrhea and Nausea And Vomiting    Other Reaction(s): Not available    HOME MEDICATIONS: Current Outpatient Medications  Medication Sig Dispense Refill   Abatacept (ORENCIA IV) Inject into the vein. On hold for the last two months     albuterol (VENTOLIN HFA) 108 (90 Base) MCG/ACT inhaler Inhale 2 puffs into the lungs every 6 (six) hours as needed for wheezing or shortness of breath. 8 g 6   atenolol (TENORMIN) 25 MG tablet Take 25 mg by mouth daily.     buPROPion (WELLBUTRIN XL) 150 MG 24 hr tablet Take 150 mg by mouth daily.     cholecalciferol (VITAMIN D) 1000 units tablet  Take 1,000 Units by mouth daily.      DULoxetine (CYMBALTA) 60 MG capsule Take 1 capsule (60 mg total) by mouth daily. (Patient not taking: Reported on 10/28/2022) 30 capsule 11   furosemide (LASIX) 20 MG tablet Take 20 mg by  mouth 2 (two) times daily.     ibuprofen (ADVIL,MOTRIN) 200 MG tablet Take 800 mg by mouth every 8 (eight) hours as needed for mild pain or moderate pain.      olmesartan (BENICAR) 20 MG tablet Take 20 mg by mouth at bedtime as needed.     predniSONE (DELTASONE) 10 MG tablet Continue tapering down prednisone dosage as previously instructed. 90 tablet 5   predniSONE (DELTASONE) 5 MG tablet Take 5 mg by mouth in the morning and at bedtime.     No current facility-administered medications for this visit.    PAST MEDICAL HISTORY: Past Medical History:  Diagnosis Date   Adrenal insufficiency (HCC)    Anemia    Anxiety    Arthritis    Back pain    had 3 fusions surgeries   H/O cardiovascular stress test 2014   admitted at hosp. overnight for chest pain, stress/echo test wnl   Hypertension    PONV (postoperative nausea and vomiting)    Rheumatoid arthritis (HCC)    manages by Dr. Corliss Skains, rec'd Orencia weekly for 2 months, switched from Humira     PAST SURGICAL HISTORY: Past Surgical History:  Procedure Laterality Date   ANTERIOR LAT LUMBAR FUSION Left 12/26/2018   Procedure: Left Lumbar Two-Three Anterolateral decompression/interbody fusion/lateral plate;  Surgeon: Barnett Abu, MD;  Location: [facility] OR;  Service: Neurosurgery;  Laterality: Left;  Anterolateral   BACK SURGERY  [phone]   lumbar fusion 3 times   CARPAL TUNNEL RELEASE Right 2000   and left side 2 years ago with left ulnar surgery   COLONOSCOPY     HERNIA REPAIR Right 2001   inguinal    REPLACEMENT TOTAL KNEE Left 05/2020    FAMILY HISTORY: Family History  Problem Relation Age of Onset   Aneurysm Mother    Leukemia Father    Healthy Daughter    Healthy Son     SOCIAL HISTORY: Social History   Socioeconomic History   Marital status: Married    Spouse name: Not on file   Number of children: 3   Years of education: Not on file   Highest education level: Not on file  Occupational History   Not on file   Tobacco Use   Smoking status: Former    Packs/day: 0.25    Years: 5.00    Additional pack years: 0.00    Total pack years: 1.25    Types: Cigarettes    Quit date: 02/18/1998    Years since quitting: 24.9   Smokeless tobacco: Never  Vaping Use   Vaping Use: Never used  Substance and Sexual Activity   Alcohol use: Not Currently    Comment: rarely   Drug use: No   Sexual activity: Yes  Other Topics Concern   Not on file  Social History Narrative   Not on file   Social Determinants of Health   Financial Resource Strain: Not on file  Food Insecurity: Not on file  Transportation Needs: Not on file  Physical Activity: Not on file  Stress: Not on file  Social Connections: Not on file  Intimate Partner Violence: Not on file      Levert Feinstein, M.D. Ph.D.  [facility] [phone]  [address], [address] [place], [place] [address] Ph: [phone] Fax: [phone]  CC:  Leane Call, PA-C [address] [place],  [place] [address]  [facility], [place], [place]

## 2023-02-04 ENCOUNTER — Ambulatory Visit (HOSPITAL_COMMUNITY)
Admission: RE | Admit: 2023-02-04 | Discharge: 2023-02-04 | Disposition: A | Discharge status: Home / Self Care | Payer: BC Managed Care – PPO | Source: Ambulatory Visit | Attending: Neurology | Admitting: Neurology

## 2023-02-04 DIAGNOSIS — M4802 Spinal stenosis, cervical region: Secondary | ICD-10-CM | POA: Diagnosis not present

## 2023-02-04 DIAGNOSIS — R32 Unspecified urinary incontinence: Secondary | ICD-10-CM | POA: Diagnosis not present

## 2023-02-04 DIAGNOSIS — M47812 Spondylosis without myelopathy or radiculopathy, cervical region: Secondary | ICD-10-CM | POA: Diagnosis not present

## 2023-02-04 DIAGNOSIS — W19XXXS Unspecified fall, sequela: Secondary | ICD-10-CM | POA: Insufficient documentation

## 2023-02-04 DIAGNOSIS — R269 Unspecified abnormalities of gait and mobility: Secondary | ICD-10-CM | POA: Diagnosis present

## 2023-02-04 DIAGNOSIS — G6181 Chronic inflammatory demyelinating polyneuritis: Secondary | ICD-10-CM | POA: Diagnosis present

## 2023-02-04 MED ORDER — GADOBUTROL 1 MMOL/ML IV SOLN
10.0000 mL | Freq: Once | INTRAVENOUS | Status: AC | PRN
  Administered 2023-02-04: 10 mL via INTRAVENOUS

## 2023-02-10 ENCOUNTER — Other Ambulatory Visit (HOSPITAL_COMMUNITY): Payer: BC Managed Care – PPO

## 2023-05-31 ENCOUNTER — Ambulatory Visit: Payer: BC Managed Care – PPO | Admitting: Neurology

## 2023-06-24 ENCOUNTER — Encounter: Payer: Self-pay | Admitting: Neurology

## 2023-06-24 ENCOUNTER — Ambulatory Visit (INDEPENDENT_AMBULATORY_CARE_PROVIDER_SITE_OTHER): Payer: BC Managed Care – PPO | Admitting: Neurology

## 2023-06-24 VITALS — BP 112/74 | HR 75 | Ht 76.0 in | Wt 305.5 lb

## 2023-06-24 DIAGNOSIS — R269 Unspecified abnormalities of gait and mobility: Secondary | ICD-10-CM

## 2023-06-24 DIAGNOSIS — G6181 Chronic inflammatory demyelinating polyneuritis: Secondary | ICD-10-CM

## 2023-06-24 NOTE — Progress Notes (Addendum)
ASSESSMENT AND PLAN  Gregory Hinton is a 57 y.o. 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, His lower extremity weakness, gait abnormality likely due to combination of lumbar radiculopathy, and CIDP Started prednisone 60 mg daily treatment since November 2023,  10 mg decrement every month, now on 20 mg daily September 2024, history of adrenal insufficiency, baseline prednisone 5 mg twice a day Spinal fluid in December 2023, total protein of 142  IVIG 2 g/kg as loading dose since February 2024, followed by 1 g/kg maintenance dose, showed mild improvement No longer on neuropathic pain medication Cymbalta gabapentin, was not sure about the benefit, complains of side effect  Overall improved gait   Return To Clinic With NP In 6 Months may consider repeat EMG nerve conduction study DIAGNOSTIC DATA (LABS, IMAGING, TESTING) - I reviewed patient records, labs, notes, testing and imaging myself where available. MRI of thoracic spine September 2023, no significant canal foraminal narrowing MRI of cervical spine, mild degenerative changes, no evidence of canal stenosis, variable degree of foraminal narrowing  MEDICAL HISTORY:  Gregory Hinton is a 57 year old male, seen in request by neurosurgeon neurosurgeon Dr. Barnett Abu, for evaluation of gradual onset arm and leg weakness, his primary care is at [facility] nodal, [place], initial evaluation August 03, 2022  I reviewed and summarized the referring note. PMHX. Adrenal insufficiency HTN Anxiety Rheumatoid arthritis Lumbar decompression x3, last was in 2022,  CTS release bilateral Left ulnar decompression in 2017.  Patient was diagnosed with adrenal insufficiency around 2016," felt terrible" then, lack of energy, laboratory evaluation confirmed adrenal insufficiency, he has been treated with prednisone 5 to 10 mg  daily, does make a difference, currently taking 5 mg twice a day  Also had longstanding history of rheumatoid arthritis, multiple joints pain, moderate to severe bilateral shoulder pain, multiple hand joints pain,   He works as a Cabin crew, including heavy lifting, working on Sales promotion account executive  Since January 2023, he noticed bilateral foot numbness tingling, as if walking on cottons, gradually getting worse, around spring 2023, he also noticed bilateral arm and leg weakness, need push-up to get up from seated position, hard to get around in his workshop, fell few times,  He had 3 lumbar decompression surgery in the past, personally reviewed MRI lumbar spine September 2022, interval L2-3 fusion with moderate residual spinal stenosis, new adjacent segment disease at L1-2 with severe spinal stenosis and moderate neuroforaminal stenosis  He since underwent posterior decompression L1 to with laminectomy and posterior lumbar interbody arthrodesis, segmental fixation L1-4, by Dr. 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,  Patient complains of slow onset, progressive worsening gait abnormality over past 1 year, to the point of at high risk for fall, difficult to get around at his workshop, also complains of deep body achy pain  Reviewed extensive laboratory evaluation October 2023, normal negative ESR C-reactive protein B12, RPR, HIV, ANA, thyroid panel, acetylcholine receptor antibody, protein electrophoresis,  Lipid panel LDL 98,  A1c  5.7  UPDATE Feb 03 2023: He was started with IVIG around February 2024 tolerating it well, did notice some improvement, less numbness, more" awakening" bilateral feet numb, tingling, difficulty falling to sleep, Cymbalta 60 mg every morning help his symptoms some  Spinal fluid testing December 2023 showed total protein of 142  He is on tapering dose of prednisone, intended to be 10 mg decrement every month, he is adjusting decrement rate based on his feeling, aiming to be 10 mg daily at next visit in 3 months, he does complains side effect of water retention, moon face weight gain, moodiness  He fell few times, over the past 6 months, has worsening urinary urgency, incontinence, even lost control of bowel movement, have accidents sometimes, also noticed increased bilateral hands numbness tingling, and weakness  Personally reviewed MRI of cervical spine, September 2023, multilevel degenerative changes, no significant canal foraminal narrowing  UPDATE Sept 19 2024: He has bilateral ankle braces, walk better, has not fell, finger can hold on things better to, tolerating IVIG  Recent mild decreased hemoglobin, going through GI workup,  PHYSICAL EXAM:         06/24/2023    1:03 PM 02/03/2023   12:45 PM 10/28/2022    9:33 AM  Vitals with BMI  Height 6\' 4"  6\' 4"    Weight 305 lbs 8 oz 303 lbs   BMI 37.2 36.9   Systolic 112 138 161  Diastolic 74 80 90  Pulse 75 86 88    PHYSICAL EXAMNIATION:  Gen: NAD, conversant, well nourised, well groomed                     Cardiovascular: Regular rate rhythm, no peripheral edema, warm, nontender. Eyes: Conjunctivae clear without exudates or hemorrhage Neck: Supple, no carotid bruits. Pulmonary: Clear to auscultation bilaterally   NEUROLOGICAL EXAM:  MENTAL STATUS: Speech/cognition: Awake, alert, oriented to history taking and casual conversation CRANIAL NERVES: CN II: Visual fields are full to confrontation. Pupils are round equal and briskly  reactive to light. CN III, IV, VI: extraocular movement are normal. No ptosis.  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 CN IX, X: Phonation is normal. CN XI: Head turning and shoulder shrug are intact  MOTOR:  No neck flexion weakness, no significant upper extremity muscle weakness,  Moderate bilateral ankle dorsiflexion weakness, toe flexion extension weakness, right worse than left,  REFLEXES: Hyperreflexia of the upper extremity, brisk right knee reflex absent ankle  SENSORY: Length-dependent decreased light touch, pinprick to the knee level, decreased to pinprick to below elbow level  COORDINATION: There is no trunk or limb dysmetria noted.  GAIT/STANCE: Need to push-up to get up from seated position, wide-based, unsteady, could not stand up on tiptoes, heels, positive Romberg signs  REVIEW OF SYSTEMS:  Full 14 system review of systems performed and notable only for as above All other review of systems were negative.   ALLERGIES: Allergies  Allergen Reactions   Atorvastatin     Other Reaction(s): Myalgias, Myalgias (intolerance)   Codeine Nausea Only    Other Reaction(s): GI Intolerance   Egg-Derived Products Diarrhea and Nausea And Vomiting    Other Reaction(s): Not available    HOME MEDICATIONS: Current Outpatient Medications  Medication Sig Dispense Refill   Abatacept (ORENCIA IV) Inject  into the vein. On hold for the last two months     atenolol (TENORMIN) 25 MG tablet Take 25 mg by mouth 2 (two) times daily.     cholecalciferol (VITAMIN D) 1000 units tablet Take 1,000 Units by mouth daily.      furosemide (LASIX) 20 MG tablet Take 20 mg by mouth 2 (two) times daily.     ibuprofen (ADVIL,MOTRIN) 200 MG tablet Take 800 mg by mouth every 8 (eight)  hours as needed for mild pain or moderate pain.      predniSONE (DELTASONE) 10 MG tablet Continue tapering down prednisone dosage as previously instructed. 90 tablet 5   albuterol (VENTOLIN HFA) 108 (90 Base) MCG/ACT inhaler Inhale 2 puffs into the lungs every 6 (six) hours as needed for wheezing or shortness of breath. (Patient not taking: Reported on 06/24/2023) 8 g 6   buPROPion (WELLBUTRIN XL) 150 MG 24 hr tablet Take 150 mg by mouth daily. (Patient not taking: Reported on 06/24/2023)     DULoxetine (CYMBALTA) 60 MG capsule Take 1 capsule (60 mg total) by mouth daily. (Patient not taking: Reported on 06/24/2023) 90 capsule 3   gabapentin (NEURONTIN) 300 MG capsule Take 3 capsules (900 mg total) by mouth at bedtime. (Patient not taking: Reported on 06/24/2023) 90 capsule 11   olmesartan (BENICAR) 20 MG tablet Take 20 mg by mouth at bedtime as needed. (Patient not taking: Reported on 06/24/2023)     predniSONE (DELTASONE) 5 MG tablet Take 5 mg by mouth in the morning and at bedtime. (Patient not taking: Reported on 06/24/2023)     No current facility-administered medications for this visit.    PAST MEDICAL HISTORY: Past Medical History:  Diagnosis Date   Adrenal insufficiency (HCC)    Anemia    Anxiety    Arthritis    Back pain    had 3 fusions surgeries   H/O cardiovascular stress test 2014   admitted at hosp. overnight for chest pain, stress/echo test wnl   Hypertension    PONV (postoperative nausea and vomiting)    Rheumatoid arthritis (HCC)    manages by Dr. Corliss Skains, rec'd Orencia weekly for 2 months, switched from Humira     PAST SURGICAL HISTORY: Past Surgical History:  Procedure Laterality Date   ANTERIOR LAT LUMBAR FUSION Left 12/26/2018   Procedure: Left Lumbar Two-Three Anterolateral decompression/interbody fusion/lateral plate;  Surgeon: Barnett Abu, MD;  Location: [facility] OR;  Service: Neurosurgery;  Laterality: Left;  Anterolateral   BACK SURGERY  [phone]   lumbar fusion 3  times   CARPAL TUNNEL RELEASE Right 2000   and left side 2 years ago with left ulnar surgery   COLONOSCOPY     HERNIA REPAIR Right 2001   inguinal    REPLACEMENT TOTAL KNEE Left 05/2020    FAMILY HISTORY: Family History  Problem Relation Age of Onset   Aneurysm Mother    Leukemia Father    Healthy Daughter    Healthy Son     SOCIAL HISTORY: Social History   Socioeconomic History   Marital status: Married    Spouse name: Not on file   Number of children: 3   Years of education: Not on file   Highest education level: Not on file  Occupational History   Not on file  Tobacco Use   Smoking status: Former    Current packs/day: 0.00    Average packs/day: 0.3 packs/day for 5.0 years (1.3 ttl pk-yrs)    Types: Cigarettes  Start date: 02/18/1993    Quit date: 02/18/1998    Years since quitting: 25.3   Smokeless tobacco: Never  Vaping Use   Vaping status: Never Used  Substance and Sexual Activity   Alcohol use: Not Currently    Comment: rarely   Drug use: No   Sexual activity: Yes  Other Topics Concern   Not on file  Social History Narrative   Not on file   Social Determinants of Health   Financial Resource Strain: Not on file  Food Insecurity: Not on file  Transportation Needs: Not on file  Physical Activity: Not on file  Stress: Not on file  Social Connections: Not on file  Intimate Partner Violence: Not on file      Levert Feinstein, M.D. Ph.D.  [facility] [address], [address] [place], [place] [address] Ph: [phone] Fax: [phone]  CC:  Leane Call, PA-C [address] [place],  [place] [address]  [facility], [place], [place]

## 2023-06-30 ENCOUNTER — Encounter: Payer: Self-pay | Admitting: Gastroenterology

## 2023-08-09 ENCOUNTER — Other Ambulatory Visit: Payer: Self-pay

## 2023-08-09 ENCOUNTER — Other Ambulatory Visit: Payer: Self-pay | Admitting: Neurology

## 2023-08-17 ENCOUNTER — Telehealth: Payer: Self-pay

## 2023-08-17 NOTE — Telephone Encounter (Signed)
Medical clearance faxed to [facility]

## 2023-08-24 NOTE — Telephone Encounter (Signed)
Form re-faxed to requested number.

## 2023-08-24 NOTE — Telephone Encounter (Signed)
[facility] is asking the medical clearance be re faxed to the attention of Magda Paganini or Archie Patten at [phone]

## 2023-09-13 NOTE — Telephone Encounter (Signed)
Tonya from Dr Deirdre Peer office is checking on the medical clearance form that was refaxed to Dr Terrace Arabia on 11-21, Tonya(Dr Boardman's lead assistant) is asking for a call back at [phone]

## 2023-09-13 NOTE — Telephone Encounter (Signed)
Called and left msg to tonya to call us back

## 2023-10-13 ENCOUNTER — Telehealth: Payer: Self-pay

## 2023-10-13 ENCOUNTER — Encounter: Payer: Self-pay | Admitting: Gastroenterology

## 2023-10-13 ENCOUNTER — Ambulatory Visit: Payer: BC Managed Care – PPO | Admitting: Gastroenterology

## 2023-10-13 VITALS — BP 124/82 | HR 86 | Ht 76.0 in | Wt 298.0 lb

## 2023-10-13 DIAGNOSIS — R1310 Dysphagia, unspecified: Secondary | ICD-10-CM

## 2023-10-13 DIAGNOSIS — R198 Other specified symptoms and signs involving the digestive system and abdomen: Secondary | ICD-10-CM

## 2023-10-13 DIAGNOSIS — K5900 Constipation, unspecified: Secondary | ICD-10-CM | POA: Diagnosis not present

## 2023-10-13 DIAGNOSIS — K219 Gastro-esophageal reflux disease without esophagitis: Secondary | ICD-10-CM | POA: Diagnosis not present

## 2023-10-13 DIAGNOSIS — D509 Iron deficiency anemia, unspecified: Secondary | ICD-10-CM

## 2023-10-13 DIAGNOSIS — K625 Hemorrhage of anus and rectum: Secondary | ICD-10-CM

## 2023-10-13 DIAGNOSIS — R197 Diarrhea, unspecified: Secondary | ICD-10-CM

## 2023-10-13 NOTE — Telephone Encounter (Signed)
 Faxed clearance to [phone] medical  records debra at [facility] . Will follow up on this

## 2023-10-13 NOTE — Progress Notes (Signed)
 Chief Complaint: For GI problems  Referring Provider:  Hunter Mickey Browner, PA-C      ASSESSMENT AND PLAN;   #1. GERD with occ dysphagia (could be d/t CIDP)  #2. Alt diarrhea and constipation. Occ rectal bleeding with anemia of chronic disease/IDA  Plan: -EGD/Colon after neurology clearence -Omeprazole 20mg  po every day #90 -Continue Mg/iron supplements.  HPI:    Gregory Hinton is a 58 y.o. 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 They report alternating constipation and diarrhea, and discomfort in the umbilical region, which they attribute to their medication regimen. The patient is currently on 20mg  of prednisone , down from 60mg , and receives IVIG treatment for CIDP. They also take daily magnesium, which they report helps with bowel regularity.  The patient has a history of colonoscopies, with the most recent one in 2019, which was unremarkable. However, they report a period of fecal incontinence, which they believe was due to medication side effects. This has since resolved after discontinuing the offending medication.  The patient also reports occasional nausea, but denies vomiting. They have noticed that their stomach feels bloated and hard after eating certain foods, leading them to modify their diet. They deny heartburn and do not take any medication for it.  The patient also has a history of Rheumatoid Arthritis (RA), but is currently focusing on managing their CIDP. They report gait abnormalities due to CIDP, but note some improvement since starting treatment. They also mention a history of back fusion surgeries and are currently being evaluated for potential issues with the surgical screws.  The patient has a known umbilical hernia. They also report occasional rectal bleeding, which they attribute to anal fissures from constipation.  They are on iron supplements due to a history of low hemoglobin levels, which they report can exacerbate their constipation.   Hemoglobin has been stable at 13-14 range on iron supplements.    Results   LABS Hemoglobin: 13.7 WBC: 16 B12: 769  RADIOLOGY MRI of the spine: Normal  DIAGNOSTIC Colonoscopy: Normal except for mild pancolonic diverticulosis (03/2018)       Past Medical History:  Diagnosis Date   Adrenal insufficiency (HCC)    Anemia    Anxiety    Arthritis    Back pain    had 3 fusions surgeries   H/O cardiovascular stress test 2014   admitted at hosp. overnight for chest pain, stress/echo test wnl   Hypertension    PONV (postoperative nausea and vomiting)    Rheumatoid arthritis (HCC)    manages by Dr. Dolphus, rec'd Orencia  weekly for 2 months, switched from Humira     Past Surgical History:  Procedure Laterality Date   ANTERIOR LAT LUMBAR FUSION Left 12/26/2018   Procedure: Left Lumbar Two-Three Anterolateral decompression/interbody fusion/lateral plate;  Surgeon: Colon Shove, MD;  Location: [facility] OR;  Service: Neurosurgery;  Laterality: Left;  Anterolateral   BACK SURGERY  [id number]   lumbar fusion 3 times   CARPAL TUNNEL RELEASE Right 2000   and left side 2 years ago with left ulnar surgery   COLONOSCOPY  07/16/2008   Mild pancolonic diverticulosis. Otherwise normal colonoscopy   ESOPHAGOGASTRODUODENOSCOPY  07/16/2008   Mild gastroduodenitis. Otherwise normal EGD   HERNIA REPAIR Right 2001   inguinal    REPLACEMENT TOTAL KNEE Left 05/2020    Family History  Problem Relation Age of Onset   Aneurysm Mother  Leukemia Father    Healthy Daughter    Healthy Son     Social History   Tobacco Use   Smoking status: Former    Current packs/day: 0.00    Average packs/day: 0.3 packs/day for 5.0 years (1.3 ttl pk-yrs)    Types: Cigarettes    Start date: 02/18/1993    Quit date: 02/18/1998    Years since quitting: 25.6   Smokeless tobacco: Never   Vaping Use   Vaping status: Never Used  Substance Use Topics   Alcohol use: Not Currently    Comment: rarely   Drug use: No    Current Outpatient Medications  Medication Sig Dispense Refill   Abatacept  (ORENCIA  IV) Inject into the vein. On hold for the last two months     albuterol  (VENTOLIN  HFA) 108 (90 Base) MCG/ACT inhaler Inhale 2 puffs into the lungs every 6 (six) hours as needed for wheezing or shortness of breath. (Patient not taking: Reported on 06/24/2023) 8 g 6   atenolol  (TENORMIN ) 25 MG tablet Take 25 mg by mouth 2 (two) times daily.     buPROPion  (WELLBUTRIN  XL) 150 MG 24 hr tablet Take 150 mg by mouth daily. (Patient not taking: Reported on 06/24/2023)     cholecalciferol (VITAMIN D) 1000 units tablet Take 1,000 Units by mouth daily.      furosemide  (LASIX ) 20 MG tablet Take 20 mg by mouth 2 (two) times daily.     ibuprofen (ADVIL,MOTRIN) 200 MG tablet Take 800 mg by mouth every 8 (eight) hours as needed for mild pain or moderate pain.      olmesartan (BENICAR) 20 MG tablet Take 20 mg by mouth at bedtime as needed. (Patient not taking: Reported on 06/24/2023)     predniSONE  (DELTASONE ) 10 MG tablet One tab po daily, Continue tapering down prednisone  dosage as previously instructed. 90 tablet 4   No current facility-administered medications for this visit.    Allergies  Allergen Reactions   Atorvastatin     Other Reaction(s): Myalgias, Myalgias (intolerance)   Codeine Nausea Only    Other Reaction(s): GI Intolerance   Egg-Derived Products Diarrhea and Nausea And Vomiting    Other Reaction(s): Not available    Review of Systems:  Constitutional: Denies fever, chills, diaphoresis, appetite change and fatigue.  HEENT: Dneg   Respiratory: Denies SOB, DOE, cough, chest tightness,  and wheezing.   Cardiovascular: Denies chest pain, palpitations and leg swelling.  Genitourinary: Denies dysuria, urgency, frequency, hematuria, flank pain and difficulty urinating.   Musculoskeletal: has myalgias, back pain, joint swelling, arthralgias and gait problem.  Neurological: Denies dizziness, seizures, syncope, has weakness, light-headedness, numbness and headaches.  Hematological: Denies adenopathy. Easy bruising, personal or family bleeding history  Psychiatric/Behavioral: No anxiety or depression     Physical Exam:    There were no vitals taken for this visit. Wt Readings from Last 3 Encounters:  06/24/23 (!) 305 lb 8 oz (138.6 kg)  02/03/23 (!) 303 lb (137.4 kg)  08/03/22 289 lb (131.1 kg)   Constitutional:  Well-developed, in no acute distress. Psychiatric: Normal mood and affect. Behavior is normal. HEENT: Pupils normal.  Conjunctivae are normal. No scleral icterus. Neck supple.  Cardiovascular: Normal rate, regular rhythm. No edema Pulmonary/chest: Effort normal and breath sounds normal. No wheezing, rales or rhonchi. Abdominal: Soft, nondistended. Nontender. Bowel sounds active throughout. There are no masses palpable. No hepatomegaly. Rectal: To be performed the time of colonoscopy Neurological: Alert and oriented to person place and time. Skin: Skin  is warm and dry. No rashes noted.  Data Reviewed: I have personally reviewed following labs and imaging studies  CBC:    Latest Ref Rng & Units 07/23/2020   10:39 AM 11/07/2019   11:14 AM 07/12/2019    8:57 AM  CBC  WBC 3.8 - 10.8 Thousand/uL 11.4  13.6  12.3   Hemoglobin 13.2 - 17.1 g/dL 84.8  84.5  83.7   Hematocrit 38.5 - 50.0 % 45.2  45.4  48.4   Platelets 140 - 400 Thousand/uL 248  254  238     CMP:    Latest Ref Rng & Units 08/03/2022    4:26 PM 07/23/2020   10:39 AM 12/07/2019    9:52 AM  CMP  Glucose 65 - 99 mg/dL  93    BUN 7 - 25 mg/dL  23    Creatinine 9.29 - 1.33 mg/dL  9.12    Sodium 864 - 853 mmol/L  139    Potassium 3.5 - 5.3 mmol/L  3.8    Chloride 98 - 110 mmol/L  104    CO2 20 - 32 mmol/L  27    Calcium 8.6 - 10.3 mg/dL  9.4    Total Protein 6.0 - 8.5 g/dL 6.1   5.9  6.4   Total Bilirubin 0.2 - 1.2 mg/dL  0.3    AST 10 - 35 U/L  11    ALT 9 - 46 U/L  19          Anselm Bring, MD 10/13/2023, 9:30 AM  Cc: Hunter Mickey Browner, PA-C

## 2023-10-13 NOTE — Patient Instructions (Addendum)
 _______________________________________________________  If your blood pressure at your visit was 140/90 or greater, please contact your primary care physician to follow up on this.  _______________________________________________________  If you are age 59 or older, your body mass index should be between 23-30. Your Body mass index is 36.27 kg/m. If this is out of the aforementioned range listed, please consider follow up with your Primary Care Provider.  If you are age 46 or younger, your body mass index should be between 19-25. Your Body mass index is 36.27 kg/m. If this is out of the aformentioned range listed, please consider follow up with your Primary Care Provider.   ________________________________________________________  The [facility] providers would like to encourage you to use MYCHART to communicate with providers for non-urgent requests or questions.  Due to long hold times on the telephone, sending your provider a message by [facility] may be a faster and more efficient way to get a response.  Please allow 48 business hours for a response.  Please remember that this is for non-urgent requests.  _______________________________________________________  We have sent the following medications to your pharmacy for you to pick up at your convenience: Omeprazole  Continue current mediations  You will be contacted by our office prior to your procedure regarding clearance.  If you do not hear from our office 2 weeks prior to your scheduled procedure, please call [phone] to discuss.    You have been scheduled for an endoscopy and colonoscopy. Please follow the written instructions given to you at your visit today.  Please pick up your prep supplies at the pharmacy within the next 1-3 days.  If you use inhalers (even only as needed), please bring them with you on the day of your procedure.  DO NOT TAKE 7 DAYS PRIOR TO TEST- Trulicity (dulaglutide) Ozempic, Wegovy  (semaglutide) Mounjaro (tirzepatide) Bydureon Bcise (exanatide extended release)  DO NOT TAKE 1 DAY PRIOR TO YOUR TEST Rybelsus (semaglutide) Adlyxin (lixisenatide) Victoza (liraglutide) Byetta (exanatide) ___________________________________________________________________________  Thank you,  Dr. Lynnie Bring

## 2023-10-18 NOTE — Telephone Encounter (Signed)
 LVM with medical records to see if they have receive my clearance

## 2023-10-18 NOTE — Telephone Encounter (Signed)
 Clearance given for patient to have the procedure. Paper sent to be scanned

## 2023-10-19 NOTE — Telephone Encounter (Signed)
 Patient made aware.

## 2023-11-29 ENCOUNTER — Encounter: Payer: Self-pay | Admitting: Gastroenterology

## 2023-11-30 NOTE — Telephone Encounter (Signed)
 Medical Clearance Form has been faxed. Confirmation of receipt received.

## 2023-12-06 ENCOUNTER — Telehealth: Payer: Self-pay | Admitting: Gastroenterology

## 2023-12-06 NOTE — Telephone Encounter (Signed)
 Goodmorning Dr. Chales Abrahams,    Patient wishes to cancel endoscopy and colonoscopy scheduled on 3/5. States he recently had a back injury and will reschedule procedures when he is healed.    Thank you.

## 2023-12-08 ENCOUNTER — Encounter: Payer: BC Managed Care – PPO | Admitting: Gastroenterology

## 2023-12-22 ENCOUNTER — Other Ambulatory Visit: Payer: Self-pay | Admitting: Neurological Surgery

## 2023-12-22 DIAGNOSIS — M25551 Pain in right hip: Secondary | ICD-10-CM

## 2023-12-22 DIAGNOSIS — S32009K Unspecified fracture of unspecified lumbar vertebra, subsequent encounter for fracture with nonunion: Secondary | ICD-10-CM

## 2024-01-12 NOTE — Progress Notes (Unsigned)
 No chief complaint on file.   HISTORY OF PRESENT ILLNESS:  01/12/24 ALL:  Gregory Hinton is a 58 y.o. male here today for follow up for CIDP diagnosed with Dr Terrace Arabia through [facility] 08/2022 and started on IVIG loading dose of 2g/kg 11/2022 and continued maintenance dose of 1g/kg since. He was last seen 06/2023 and doing well.    Ankle braces    HISTORY (copied from Dr Zannie Cove previous note)  Gregory Hinton is a 58 year old male, seen in request by neurosurgeon neurosurgeon Dr. Barnett Abu, for evaluation of gradual onset arm and leg weakness, his primary care is at [facility] nodal, Joline Salt, initial evaluation August 03, 2022   I reviewed and summarized the referring note. PMHX. Adrenal insufficiency HTN Anxiety Rheumatoid arthritis Lumbar decompression x3, last was in 2022,  CTS release bilateral Left ulnar decompression in 2017.   Patient was diagnosed with adrenal insufficiency around 2016," felt terrible" then, lack of energy, laboratory evaluation confirmed adrenal insufficiency, he has been treated with prednisone 5 to 10 mg daily, does make a difference, currently taking 5 mg twice a day   Also had longstanding history of rheumatoid arthritis, multiple joints pain, moderate to severe bilateral shoulder pain, multiple hand joints pain,    He works as a Cabin crew, including heavy lifting, working on Sales promotion account executive   Since January 2023, he noticed bilateral foot numbness tingling, as if walking on cottons, gradually getting worse, around spring 2023, he also noticed bilateral arm and leg weakness, need push-up to get up from seated position, hard to get around in his workshop, fell few times,   He had 3 lumbar decompression surgery in the past, personally reviewed MRI lumbar spine September 2022, interval L2-3 fusion with moderate residual spinal stenosis, new adjacent segment disease at L1-2 with severe spinal stenosis and moderate neuroforaminal stenosis   He  since underwent posterior decompression L1 to with laminectomy and posterior lumbar interbody arthrodesis, segmental fixation L1-4, by Dr. 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,   Patient complains of slow onset, progressive worsening gait abnormality over past 1 year, to the point of at high risk for fall, difficult to get around at his workshop, also complains of deep body achy pain   Reviewed extensive laboratory evaluation October 2023, normal negative ESR C-reactive protein B12, RPR, HIV, ANA, thyroid panel, acetylcholine receptor antibody, protein electrophoresis,   Lipid panel LDL 98, A1c 5.7   UPDATE Feb 03 2023: He was started with IVIG around February 2024 tolerating it well, did notice some improvement, less numbness, more" awakening" bilateral feet numb, tingling, difficulty falling to sleep, Cymbalta 60 mg every morning help his symptoms some   Spinal fluid testing December 2023 showed total protein of 142   He is on tapering dose of prednisone, intended to be 10 mg decrement every month, he is adjusting decrement rate based on his feeling, aiming to be 10 mg daily at next visit in 3 months, he does complains side effect of water retention, moon face weight gain, moodiness  He fell few times, over the past 6 months, has worsening urinary urgency, incontinence, even lost control of bowel movement, have accidents sometimes, also noticed increased bilateral hands numbness tingling, and weakness   Personally  reviewed MRI of cervical spine, September 2023, multilevel degenerative changes, no significant canal foraminal narrowing   UPDATE Sept 19 2024: He has bilateral ankle braces, walk better, has not fell, finger can hold on things better to, tolerating IVIG   Recent mild decreased hemoglobin, going through GI workup   REVIEW OF SYSTEMS: Out of a complete 14 system review of symptoms, lower ext weakness, the patient complains only of the following symptoms, and all other reviewed systems are negative.   ALLERGIES: Allergies  Allergen Reactions   Atorvastatin     Other Reaction(s): Myalgias, Myalgias (intolerance)   Codeine Nausea Only    Other Reaction(s): GI Intolerance   Egg-Derived Products Diarrhea and Nausea And Vomiting    Other Reaction(s): Not available     HOME MEDICATIONS: Outpatient Medications Prior to Visit  Medication Sig Dispense Refill   Abatacept (ORENCIA IV) Inject into the vein. On hold for the last two months (Patient not taking: Reported on 10/13/2023)     atenolol (TENORMIN) 25 MG tablet Take 25 mg by mouth 2 (two) times daily.     buPROPion (WELLBUTRIN XL) 150 MG 24 hr tablet Take 150 mg by mouth daily.     cholecalciferol (VITAMIN D) 1000 units tablet Take 1,000 Units by mouth daily.      furosemide (LASIX) 20 MG tablet Take 20 mg by mouth 2 (two) times daily.     ibuprofen (ADVIL,MOTRIN) 200 MG tablet Take 800 mg by mouth every 8 (eight) hours as needed for mild pain or moderate pain.      Immune Globulin, Human, (OCTAGAM IV) Inject into the vein as directed. Infusion every 3 weeks twice that week     predniSONE (DELTASONE) 10 MG tablet One tab po daily, Continue tapering down prednisone dosage as previously instructed. (Patient taking differently: Take 20 mg by mouth daily. One tab po daily, Continue tapering down prednisone dosage as previously instructed.) 90 tablet 4   No facility-administered medications prior to visit.     PAST MEDICAL HISTORY: Past  Medical History:  Diagnosis Date   Adrenal insufficiency (HCC)    Anemia    Anxiety    Arthritis    Back pain    had 3 fusions surgeries   CIDP (chronic inflammatory demyelinating polyneuropathy) (HCC)    H/O cardiovascular stress test 2014   admitted at hosp. overnight for chest pain, stress/echo test wnl   Hypertension    PONV (postoperative nausea and vomiting)    Rheumatoid arthritis (HCC)    manages by Dr. Corliss Skains, rec'd Orencia weekly for 2 months, switched from Humira      PAST SURGICAL HISTORY: Past Surgical History:  Procedure Laterality Date   ANTERIOR LAT LUMBAR FUSION Left 12/26/2018   Procedure: Left Lumbar Two-Three Anterolateral decompression/interbody fusion/lateral plate;  Surgeon: Barnett Abu, MD;  Location: [facility] OR;  Service: Neurosurgery;  Laterality: Left;  Anterolateral   BACK SURGERY  [phone]   lumbar fusion 3 times   BACK SURGERY  2022   CARPAL TUNNEL RELEASE Right 2000   and left side 2 years ago with left ulnar surgery   COLONOSCOPY  07/16/2008   Mild pancolonic diverticulosis. Otherwise normal colonoscopy   ESOPHAGOGASTRODUODENOSCOPY  07/16/2008   Mild gastroduodenitis. Otherwise normal EGD   HERNIA REPAIR Right 2001  inguinal    REPLACEMENT TOTAL KNEE Left 05/2020     FAMILY HISTORY: Family History  Problem Relation Age of Onset   Aneurysm Mother    Leukemia Father    Healthy Daughter    Healthy Son      SOCIAL HISTORY: Social History   Socioeconomic History   Marital status: Married    Spouse name: Not on file   Number of children: 3   Years of education: Not on file   Highest education level: Not on file  Occupational History   Not on file  Tobacco Use   Smoking status: Former    Current packs/day: 0.00    Average packs/day: 0.3 packs/day for 5.0 years (1.3 ttl pk-yrs)    Types: Cigarettes    Start date: 02/18/1993    Quit date: 02/18/1998    Years since quitting: 25.9   Smokeless tobacco: Never  Vaping Use   Vaping  status: Never Used  Substance and Sexual Activity   Alcohol use: Not Currently    Comment: rarely   Drug use: No   Sexual activity: Yes  Other Topics Concern   Not on file  Social History Narrative   Not on file   Social Drivers of Health   Financial Resource Strain: Not on file  Food Insecurity: Not on file  Transportation Needs: Not on file  Physical Activity: Not on file  Stress: Not on file  Social Connections: Not on file  Intimate Partner Violence: Not on file     PHYSICAL EXAM  There were no vitals filed for this visit. There is no height or weight on file to calculate BMI.  Generalized: Well developed, in no acute distress  Cardiology: normal rate and rhythm, no murmur auscultated  Respiratory: clear to auscultation bilaterally    Neurological examination  Mentation: Alert oriented to time, place, history taking. Follows all commands speech and language fluent Cranial nerve II-XII: Pupils were equal round reactive to light. Extraocular movements were full, visual field were full on confrontational test. Facial sensation and strength were normal. Uvula tongue midline. Head turning and shoulder shrug  were normal and symmetric. Motor: The motor testing reveals 5 over 5 strength of all 4 extremities. Good symmetric motor tone is noted throughout.  Sensory: Sensory testing is intact to soft touch on all 4 extremities. No evidence of extinction is noted.  Coordination: Cerebellar testing reveals good finger-nose-finger and heel-to-shin bilaterally.  Gait and station: Gait is normal. Tandem gait is normal. Romberg is negative. No drift is seen.  Reflexes: Deep tendon reflexes are symmetric and normal bilaterally.    DIAGNOSTIC DATA (LABS, IMAGING, TESTING) - I reviewed patient records, labs, notes, testing and imaging myself where available.  Lab Results  Component Value Date   WBC 11.4 (H) 07/23/2020   HGB 15.1 07/23/2020   HCT 45.2 07/23/2020   MCV 92.2  07/23/2020   PLT 248 07/23/2020      Component Value Date/Time   NA 139 07/23/2020 1039   K 3.8 07/23/2020 1039   CL 104 07/23/2020 1039   CO2 27 07/23/2020 1039   GLUCOSE 93 07/23/2020 1039   BUN 23 07/23/2020 1039   CREATININE 0.87 07/23/2020 1039   CALCIUM 9.4 07/23/2020 1039   PROT 6.1 08/03/2022 1626   ALBUMIN 3.0 (L) 11/24/2017 2013   AST 11 07/23/2020 1039   ALT 19 07/23/2020 1039   ALKPHOS 28 (L) 11/24/2017 2013   BILITOT 0.3 07/23/2020 1039   GFRNONAA 97 07/23/2020 1039  GFRAA 113 07/23/2020 1039   No results found for: "CHOL", "HDL", "LDLCALC", "LDLDIRECT", "TRIG", "CHOLHDL" Lab Results  Component Value Date   HGBA1C 5.7 (H) 08/03/2022   Lab Results  Component Value Date   VITAMINB12 769 08/03/2022   Lab Results  Component Value Date   TSH 1.610 08/03/2022        No data to display               No data to display           ASSESSMENT AND PLAN  58 y.o. year old male  has a past medical history of Adrenal insufficiency (HCC), Anemia, Anxiety, Arthritis, Back pain, CIDP (chronic inflammatory demyelinating polyneuropathy) (HCC), H/O cardiovascular stress test (2014), Hypertension, PONV (postoperative nausea and vomiting), and Rheumatoid arthritis (HCC). here with    No diagnosis found.  Anitra Lauth ***.  Healthy lifestyle habits encouraged. *** will follow up with PCP as directed. *** will return to see me in ***, sooner if needed. *** verbalizes understanding and agreement with this plan.   No orders of the defined types were placed in this encounter.    No orders of the defined types were placed in this encounter.    Shawnie Dapper, MSN, FNP-C 01/12/2024, 2:48 PM  [facility] [address] [place], [place] [address] [phone]

## 2024-01-12 NOTE — Patient Instructions (Incomplete)

## 2024-01-13 ENCOUNTER — Ambulatory Visit: Payer: BC Managed Care – PPO | Admitting: Family Medicine

## 2024-01-13 ENCOUNTER — Encounter: Payer: Self-pay | Admitting: Family Medicine

## 2024-01-13 VITALS — BP 127/75 | HR 77 | Ht 76.0 in | Wt 286.0 lb

## 2024-01-13 DIAGNOSIS — G629 Polyneuropathy, unspecified: Secondary | ICD-10-CM

## 2024-01-13 DIAGNOSIS — R269 Unspecified abnormalities of gait and mobility: Secondary | ICD-10-CM

## 2024-01-13 DIAGNOSIS — W19XXXS Unspecified fall, sequela: Secondary | ICD-10-CM

## 2024-01-13 DIAGNOSIS — G6181 Chronic inflammatory demyelinating polyneuritis: Secondary | ICD-10-CM

## 2024-01-13 DIAGNOSIS — R531 Weakness: Secondary | ICD-10-CM | POA: Diagnosis not present

## 2024-01-13 MED ORDER — PREDNISONE 10 MG PO TABS: 15.0000 mg | ORAL_TABLET | Freq: Every day | ORAL | 3 refills | Status: AC

## 2024-01-14 ENCOUNTER — Ambulatory Visit
Admission: RE | Admit: 2024-01-14 | Discharge: 2024-01-14 | Disposition: A | Discharge status: Home / Self Care | Source: Ambulatory Visit | Attending: Neurological Surgery | Admitting: Neurological Surgery

## 2024-01-14 DIAGNOSIS — M25551 Pain in right hip: Secondary | ICD-10-CM

## 2024-01-14 DIAGNOSIS — S32009K Unspecified fracture of unspecified lumbar vertebra, subsequent encounter for fracture with nonunion: Secondary | ICD-10-CM

## 2024-01-14 MED ORDER — GADOPICLENOL 0.5 MMOL/ML IV SOLN
10.0000 mL | Freq: Once | INTRAVENOUS | Status: AC | PRN
  Administered 2024-01-14: 10 mL via INTRAVENOUS

## 2024-05-16 ENCOUNTER — Other Ambulatory Visit: Payer: Self-pay

## 2024-05-16 ENCOUNTER — Other Ambulatory Visit (INDEPENDENT_AMBULATORY_CARE_PROVIDER_SITE_OTHER): Payer: Self-pay

## 2024-05-16 DIAGNOSIS — Z0289 Encounter for other administrative examinations: Secondary | ICD-10-CM

## 2024-05-16 DIAGNOSIS — G6181 Chronic inflammatory demyelinating polyneuritis: Secondary | ICD-10-CM

## 2024-05-16 NOTE — Progress Notes (Signed)
 Order placed for labs.

## 2024-05-17 ENCOUNTER — Ambulatory Visit: Payer: Self-pay | Admitting: Neurology

## 2024-05-17 LAB — BASIC METABOLIC PANEL WITH GFR
BUN/Creatinine Ratio: 26 — ABNORMAL HIGH (ref 9–20)
BUN: 25 mg/dL — ABNORMAL HIGH (ref 6–24)
CO2: 23 mmol/L (ref 20–29)
Calcium: 9.4 mg/dL (ref 8.7–10.2)
Chloride: 100 mmol/L (ref 96–106)
Creatinine, Ser: 0.97 mg/dL (ref 0.76–1.27)
Glucose: 101 mg/dL — ABNORMAL HIGH (ref 70–99)
Potassium: 3.8 mmol/L (ref 3.5–5.2)
Sodium: 139 mmol/L (ref 134–144)
eGFR: 90 mL/min/1.73 (ref >59)

## 2024-06-12 ENCOUNTER — Other Ambulatory Visit: Payer: Self-pay | Admitting: Neurological Surgery

## 2024-06-12 ENCOUNTER — Encounter: Payer: Self-pay | Admitting: Neurological Surgery

## 2024-06-12 DIAGNOSIS — M4714 Other spondylosis with myelopathy, thoracic region: Secondary | ICD-10-CM

## 2024-06-23 ENCOUNTER — Ambulatory Visit
Admission: RE | Admit: 2024-06-23 | Discharge: 2024-06-23 | Disposition: A | Discharge status: Home / Self Care | Source: Ambulatory Visit | Attending: Neurological Surgery | Admitting: Neurological Surgery

## 2024-06-23 DIAGNOSIS — M4714 Other spondylosis with myelopathy, thoracic region: Secondary | ICD-10-CM

## 2024-07-04 ENCOUNTER — Other Ambulatory Visit: Payer: Self-pay | Admitting: Neurological Surgery

## 2024-07-06 ENCOUNTER — Other Ambulatory Visit: Payer: Self-pay | Admitting: Neurological Surgery

## 2024-07-07 NOTE — Progress Notes (Signed)
 Surgical Instructions   Your procedure is scheduled on Thursday, October 9th, 2025. Report to [facility] Main Entrance A at 11:15 A.M., then check in with the Admitting office. Any questions or running late day of surgery: call [phone]  Questions prior to your surgery date: call [phone], Monday-Friday, 8am-4pm. If you experience any cold or flu symptoms such as cough, fever, chills, shortness of breath, etc. between now and your scheduled surgery, please notify us  at the above number.     Remember:  Do not eat or drink after midnight the night before your surgery     Take these medicines the morning of surgery with A SIP OF WATER : Atenolol  (Tenormin ) Prednisone  (Deltasone )   May take these medicines IF NEEDED: None.    One week prior to surgery, STOP taking any Aspirin (unless otherwise instructed by your surgeon) Aleve, Naproxen, Ibuprofen, Motrin, Advil, Goody's, BC's, all herbal medications, fish oil, and non-prescription vitamins.                     Do NOT Smoke (Tobacco/Vaping) for 24 hours prior to your procedure.  If you use a CPAP at night, you may bring your mask/headgear for your overnight stay.   You will be asked to remove any contacts, glasses, piercing's, hearing aid's, dentures/partials prior to surgery. Please bring cases for these items if needed.    Patients discharged the day of surgery will not be allowed to drive home, and someone needs to stay with them for 24 hours.  SURGICAL WAITING ROOM VISITATION Patients may have no more than 2 support people in the waiting area - these visitors may rotate.   Pre-op nurse will coordinate an appropriate time for 1 ADULT support person, who may not rotate, to accompany patient in pre-op.  Children under the age of 49 must have an adult with them who is not the patient and must remain in the main waiting area with an adult.  If the patient needs to stay at the hospital during part of their recovery, the  visitor guidelines for inpatient rooms apply.  Please refer to the [facility] website for the visitor guidelines for any additional information.   If you received a COVID test during your pre-op visit  it is requested that you wear a mask when out in public, stay away from anyone that may not be feeling well and notify your surgeon if you develop symptoms. If you have been in contact with anyone that has tested positive in the last 10 days please notify you surgeon.      Pre-operative 4 CHG Bathing Instructions   You can play a key role in reducing the risk of infection after surgery. Your skin needs to be as free of germs as possible. You can reduce the number of germs on your skin by washing with CHG (chlorhexidine  gluconate) soap before surgery. CHG is an antiseptic soap that kills germs and continues to kill germs even after washing.   DO NOT use if you have an allergy to chlorhexidine /CHG or antibacterial soaps. If your skin becomes reddened or irritated, stop using the CHG and notify one of our RNs at [phone].   Please shower with the CHG soap starting 4 days before surgery using the following schedule:     Please keep in mind the following:  DO NOT shave, including legs and underarms, starting the day of your first shower.   You may shave your face at any point before/day of surgery.  Place clean sheets on your bed the day you start using CHG soap. Use a clean washcloth (not used since being washed) for each shower. DO NOT sleep with pets once you start using the CHG.   CHG Shower Instructions:  Wash your face and private area with normal soap. If you choose to wash your hair, wash first with your normal shampoo.  After you use shampoo/soap, rinse your hair and body thoroughly to remove shampoo/soap residue.  Turn the water  OFF and apply about 3 tablespoons (45 ml) of CHG soap to a CLEAN washcloth.  Apply CHG soap ONLY FROM YOUR NECK DOWN TO YOUR TOES (washing for 3-5  minutes)  DO NOT use CHG soap on face, private areas, open wounds, or sores.  Pay special attention to the area where your surgery is being performed.  If you are having back surgery, having someone wash your back for you may be helpful. Wait 2 minutes after CHG soap is applied, then you may rinse off the CHG soap.  Pat dry with a clean towel  Put on clean clothes/pajamas   If you choose to wear lotion, please use ONLY the CHG-compatible lotions that are listed below.  Additional instructions for the day of surgery:  If you choose, you may shower the morning of surgery with an antibacterial soap.  DO NOT APPLY any lotions, deodorants, cologne, or perfumes.   Do not bring valuables to the hospital. [facility] is not responsible for any belongings/valuables. Do not wear nail polish, gel polish, artificial nails, or any other type of covering on natural nails (fingers and toes) Do not wear jewelry or makeup Put on clean/comfortable clothes.  Please brush your teeth.  Ask your nurse before applying any prescription medications to the skin.     CHG Compatible Lotions   Aveeno Moisturizing lotion  Cetaphil Moisturizing Cream  Cetaphil Moisturizing Lotion  Clairol Herbal Essence Moisturizing Lotion, Dry Skin  Clairol Herbal Essence Moisturizing Lotion, Extra Dry Skin  Clairol Herbal Essence Moisturizing Lotion, Normal Skin  Curel Age Defying Therapeutic Moisturizing Lotion with Alpha Hydroxy  Curel Extreme Care Body Lotion  Curel Soothing Hands Moisturizing Hand Lotion  Curel Therapeutic Moisturizing Cream, Fragrance-Free  Curel Therapeutic Moisturizing Lotion, Fragrance-Free  Curel Therapeutic Moisturizing Lotion, Original Formula  Eucerin Daily Replenishing Lotion  Eucerin Dry Skin Therapy Plus Alpha Hydroxy Crme  Eucerin Dry Skin Therapy Plus Alpha Hydroxy Lotion  Eucerin Original Crme  Eucerin Original Lotion  Eucerin Plus Crme Eucerin Plus Lotion  Eucerin TriLipid  Replenishing Lotion  Keri Anti-Bacterial Hand Lotion  Keri Deep Conditioning Original Lotion Dry Skin Formula Softly Scented  Keri Deep Conditioning Original Lotion, Fragrance Free Sensitive Skin Formula  Keri Lotion Fast Absorbing Fragrance Free Sensitive Skin Formula  Keri Lotion Fast Absorbing Softly Scented Dry Skin Formula  Keri Original Lotion  Keri Skin Renewal Lotion Keri Silky Smooth Lotion  Keri Silky Smooth Sensitive Skin Lotion  Nivea Body Creamy Conditioning Oil  Nivea Body Extra Enriched Lotion  Nivea Body Original Lotion  Nivea Body Sheer Moisturizing Lotion Nivea Crme  Nivea Skin Firming Lotion  NutraDerm 30 Skin Lotion  NutraDerm Skin Lotion  NutraDerm Therapeutic Skin Cream  NutraDerm Therapeutic Skin Lotion  ProShield Protective Hand Cream  Provon moisturizing lotion  Please read over the following fact sheets that you were given.

## 2024-07-10 ENCOUNTER — Encounter (HOSPITAL_COMMUNITY)
Admission: RE | Admit: 2024-07-10 | Discharge: 2024-07-10 | Disposition: A | Discharge status: Home / Self Care | Source: Ambulatory Visit | Attending: Neurological Surgery | Admitting: Neurological Surgery

## 2024-07-10 ENCOUNTER — Other Ambulatory Visit: Payer: Self-pay

## 2024-07-10 ENCOUNTER — Encounter (HOSPITAL_COMMUNITY): Payer: Self-pay

## 2024-07-10 VITALS — BP 116/65 | HR 67 | Temp 98.2°F | Resp 18 | Ht 75.0 in | Wt 273.0 lb

## 2024-07-10 DIAGNOSIS — G6181 Chronic inflammatory demyelinating polyneuritis: Secondary | ICD-10-CM | POA: Insufficient documentation

## 2024-07-10 DIAGNOSIS — Z955 Presence of coronary angioplasty implant and graft: Secondary | ICD-10-CM | POA: Diagnosis not present

## 2024-07-10 DIAGNOSIS — R9431 Abnormal electrocardiogram [ECG] [EKG]: Secondary | ICD-10-CM | POA: Insufficient documentation

## 2024-07-10 DIAGNOSIS — I2510 Atherosclerotic heart disease of native coronary artery without angina pectoris: Secondary | ICD-10-CM | POA: Diagnosis not present

## 2024-07-10 DIAGNOSIS — Z7952 Long term (current) use of systemic steroids: Secondary | ICD-10-CM | POA: Diagnosis not present

## 2024-07-10 DIAGNOSIS — Z79899 Other long term (current) drug therapy: Secondary | ICD-10-CM | POA: Insufficient documentation

## 2024-07-10 DIAGNOSIS — I10 Essential (primary) hypertension: Secondary | ICD-10-CM | POA: Diagnosis not present

## 2024-07-10 DIAGNOSIS — Z01810 Encounter for preprocedural cardiovascular examination: Secondary | ICD-10-CM | POA: Diagnosis present

## 2024-07-10 DIAGNOSIS — Z01818 Encounter for other preprocedural examination: Secondary | ICD-10-CM | POA: Insufficient documentation

## 2024-07-10 DIAGNOSIS — E785 Hyperlipidemia, unspecified: Secondary | ICD-10-CM | POA: Diagnosis not present

## 2024-07-10 DIAGNOSIS — Z01812 Encounter for preprocedural laboratory examination: Secondary | ICD-10-CM | POA: Diagnosis present

## 2024-07-10 HISTORY — DX: Atherosclerotic heart disease of native coronary artery without angina pectoris: I25.10

## 2024-07-10 LAB — BASIC METABOLIC PANEL WITH GFR
Anion gap: 10 (ref 5–15)
BUN: 21 mg/dL — ABNORMAL HIGH (ref 6–20)
CO2: 24 mmol/L (ref 22–32)
Calcium: 9.3 mg/dL (ref 8.9–10.3)
Chloride: 105 mmol/L (ref 98–111)
Creatinine, Ser: 0.88 mg/dL (ref 0.61–1.24)
GFR, Estimated: >60 mL/min (ref >60)
Glucose, Bld: 103 mg/dL — ABNORMAL HIGH (ref 70–99)
Potassium: 4.1 mmol/L (ref 3.5–5.1)
Sodium: 139 mmol/L (ref 135–145)

## 2024-07-10 LAB — SURGICAL PCR SCREEN
MRSA, PCR: NEGATIVE
Staphylococcus aureus: NEGATIVE

## 2024-07-10 LAB — CBC
HCT: 41.8 % (ref 39.0–52.0)
Hemoglobin: 13.5 g/dL (ref 13.0–17.0)
MCH: 29 pg (ref 26.0–34.0)
MCHC: 32.3 g/dL (ref 30.0–36.0)
MCV: 89.7 fL (ref 80.0–100.0)
Platelets: 242 K/uL (ref 150–400)
RBC: 4.66 MIL/uL (ref 4.22–5.81)
RDW: 13.8 % (ref 11.5–15.5)
WBC: 11.9 K/uL — ABNORMAL HIGH (ref 4.0–10.5)
nRBC: 0 % (ref 0.0–0.2)

## 2024-07-10 LAB — TYPE AND SCREEN
ABO/RH(D): A POS
Antibody Screen: NEGATIVE

## 2024-07-10 NOTE — Progress Notes (Signed)
 PCP - Alberteen Hunter Raddle, PA-C Cardiologist - Debby Dicker Folk,MD- last office vist 05/05/24 Neurologist - Modena Onita COME  PPM/ICD - denies Device Orders -  Rep Notified -   Chest x-ray - na EKG - 07/10/24 Stress Test - na ECHO - TEE- 04/12/23 Cardiac Cath - 11/05/21  Sleep Study - denies CPAP - no  Fasting Blood Sugar - na Checks Blood Sugar _____ times a day  Last dose of GLP1 agonist-  na GLP1 instructions: na  Blood Thinner Instructions:na Aspirin Instructions:na  ERAS Protcol - NPO PRE-SURGERY Ensure or G2-   COVID TEST- na   Anesthesia review: yes- history of CIPD,CAD-stent Left PDA 2023  Patient denies shortness of breath, fever, cough and chest pain at PAT appointment   All instructions explained to the patient, with a verbal understanding of the material. Patient agrees to go over the instructions while at home for a better understanding. The opportunity to ask questions was provided.

## 2024-07-11 NOTE — Progress Notes (Signed)
 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  Echo 04/2023 showed LVEF 60 to 65%, normal RV, no significant valvular abnormalities.  Last seen by Dr. Raylene 05/05/2024 and stable at that time from cardiac standpoint, no changes to management, recommended 6 to 78-month follow-up.  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  At that time he was started on IVIG loading dose of 2g/kg 11/2022 and continued maintenance dose of 1g/kg since. Prednisone  started at 60mg  daily and has been tapered down, currently 10 to 15 mg daily.  Last seen in follow-up by Greig Forbes, NP on 01/13/2024 and noted to be doing well at that time.  No changes to management.  Other pertinent history includes PONV, rheumatoid arthritis.  Per anesthesia intubation note 08/18/2021, GlideScope used electively.  No difficulty documented.  Preop labs reviewed, unremarkable.  EKG 07/10/2024: Normal sinus rhythm.  Rate 69. Increased R/S ratio in V1, consider early transition or posterior infarct  TTE 04/12/2023 (Care Everywhere): SUMMARY  Mild left ventricular hypertrophy  Left ventricular systolic function is normal.  LV ejection fraction = 60-65%.  There is no significant valvular stenosis or regurgitation.  Probably no significant change in comparison with the prior study noted   Cath/PCI 11/05/2021 (Care Everywhere): CONCLUSIONS    1.  Single-vessel obstructive CAD.  A.  90% stenosis and left-sided PDA, stented with 2.5 x 18 mm Xience sky  point DES, final stent diameter 2.9 mm.  2.  Otherwise normal coronary arteries.  3.  Left ventriculography not performed to conserve contrast.  4.  Right radial artery access.  5.  No complications.    RECOMMENDATIONS   1.  Aspirin 81 mg QD indefinitely.  2.  Brilinta 90 BID through 11-05-2022, do not interrupt for at least 3  months.  3. Continue home  atenolol  25 BID.  4.  Normal LV function by previous echo.  5.  Continue home olmesartan and furosemide .  6.  Add statin if pt not already on one.  7.  Refer to cardiac rehab.      Lynwood Geofm RIGGERS [facility] Short Stay Center/Anesthesiology Phone [phone] 07/11/2024 3:44 PM

## 2024-07-11 NOTE — Anesthesia Preprocedure Evaluation (Signed)
 Anesthesia Evaluation  Patient identified by MRN, date of birth, ID band Patient awake    Reviewed: Allergy & Precautions, NPO status , Patient's Chart, lab work & pertinent test results, reviewed documented beta blocker date and time   History of Anesthesia Complications (+) PONV and history of anesthetic complications  Airway Mallampati: II  TM Distance: >3 FB Neck ROM: Full    Dental  (+) Dental Advisory Given, Teeth Intact   Pulmonary former smoker   Pulmonary exam normal        Cardiovascular hypertension, Pt. on home beta blockers and Pt. on medications + CAD  Normal cardiovascular exam     Neuro/Psych  PSYCHIATRIC DISORDERS Anxiety      Chronic inflammatory demyelinating polyneuropathy   Neuromuscular disease    GI/Hepatic negative GI ROS, Neg liver ROS,,,  Endo/Other   Obesity   Renal/GU negative Renal ROS     Musculoskeletal  (+) Arthritis , Rheumatoid disorders,    Abdominal  (+) + obese  Peds  Hematology negative hematology ROS (+)   Anesthesia Other Findings   Reproductive/Obstetrics                              Anesthesia Physical Anesthesia Plan  ASA: 3  Anesthesia Plan: General   Post-op Pain Management: Tylenol  PO (pre-op)*   Induction: Intravenous  PONV Risk Score and Plan: 3 and Treatment may vary due to age or medical condition, Ondansetron , Dexamethasone  and Midazolam   Airway Management Planned: Oral ETT  Additional Equipment: None  Intra-op Plan:   Post-operative Plan: Extubation in OR  Informed Consent: I have reviewed the patients History and Physical, chart, labs and discussed the procedure including the risks, benefits and alternatives for the proposed anesthesia with the patient or authorized representative who has indicated his/her understanding and acceptance.     Dental advisory given  Plan Discussed with: CRNA and  Anesthesiologist  Anesthesia Plan Comments: (PAT note by Lynwood Hope, PA-C)         Anesthesia Quick Evaluation

## 2024-07-13 ENCOUNTER — Observation Stay (HOSPITAL_COMMUNITY)
Admission: RE | Admit: 2024-07-13 | Discharge: 2024-07-15 | Disposition: A | Discharge status: Home / Self Care | Attending: Neurological Surgery | Admitting: Neurological Surgery

## 2024-07-13 ENCOUNTER — Ambulatory Visit (HOSPITAL_COMMUNITY): Admitting: Anesthesiology

## 2024-07-13 ENCOUNTER — Other Ambulatory Visit: Payer: Self-pay

## 2024-07-13 ENCOUNTER — Encounter (HOSPITAL_COMMUNITY)
Admission: RE | Disposition: A | Discharge status: Home / Self Care | Payer: Self-pay | Source: Home / Self Care | Attending: Neurological Surgery

## 2024-07-13 ENCOUNTER — Ambulatory Visit (HOSPITAL_COMMUNITY): Payer: Self-pay | Admitting: Physician Assistant

## 2024-07-13 ENCOUNTER — Encounter (HOSPITAL_COMMUNITY): Payer: Self-pay | Admitting: Neurological Surgery

## 2024-07-13 ENCOUNTER — Ambulatory Visit (HOSPITAL_COMMUNITY)

## 2024-07-13 DIAGNOSIS — I2510 Atherosclerotic heart disease of native coronary artery without angina pectoris: Secondary | ICD-10-CM | POA: Insufficient documentation

## 2024-07-13 DIAGNOSIS — Z87891 Personal history of nicotine dependence: Secondary | ICD-10-CM | POA: Insufficient documentation

## 2024-07-13 DIAGNOSIS — M5104 Intervertebral disc disorders with myelopathy, thoracic region: Secondary | ICD-10-CM | POA: Insufficient documentation

## 2024-07-13 DIAGNOSIS — M4804 Spinal stenosis, thoracic region: Principal | ICD-10-CM | POA: Diagnosis present

## 2024-07-13 DIAGNOSIS — M5440 Lumbago with sciatica, unspecified side: Secondary | ICD-10-CM | POA: Diagnosis present

## 2024-07-13 DIAGNOSIS — I10 Essential (primary) hypertension: Secondary | ICD-10-CM | POA: Diagnosis not present

## 2024-07-13 DIAGNOSIS — Z79899 Other long term (current) drug therapy: Secondary | ICD-10-CM | POA: Insufficient documentation

## 2024-07-13 SURGERY — THORACIC FUSION 1 LEVEL
Anesthesia: General | Site: Spine Thoracic

## 2024-07-13 MED ORDER — THROMBIN 5000 UNITS EX SOLR: OROMUCOSAL | Status: DC | PRN

## 2024-07-13 MED ORDER — DEXAMETHASONE SOD PHOSPHATE PF 10 MG/ML IJ SOLN
INTRAMUSCULAR | Status: DC | PRN
Start: 2024-07-13 — End: 2024-07-13
  Administered 2024-07-13: 10 mg via INTRAVENOUS

## 2024-07-13 MED ORDER — OXYCODONE HCL 5 MG/5ML PO SOLN: 5.0000 mg | Freq: Once | ORAL | Status: DC | PRN

## 2024-07-13 MED ORDER — CEFAZOLIN SODIUM-DEXTROSE 2-4 GM/100ML-% IV SOLN
INTRAVENOUS | Status: AC
  Filled 2024-07-13: qty 100

## 2024-07-13 MED ORDER — BUPIVACAINE HCL (PF) 0.5 % IJ SOLN
INTRAMUSCULAR | Status: DC | PRN
Start: 2024-07-13 — End: 2024-07-13
  Administered 2024-07-13: 5 mL
  Administered 2024-07-13: 25 mL

## 2024-07-13 MED ORDER — METHOCARBAMOL 500 MG PO TABS
500.0000 mg | ORAL_TABLET | Freq: Four times a day (QID) | ORAL | Status: DC | PRN
  Administered 2024-07-13 – 2024-07-15 (×3): 500 mg via ORAL
  Filled 2024-07-13 (×3): qty 1

## 2024-07-13 MED ORDER — FENTANYL CITRATE (PF) 250 MCG/5ML IJ SOLN
INTRAMUSCULAR | Status: AC
  Filled 2024-07-13: qty 5

## 2024-07-13 MED ORDER — FENTANYL CITRATE (PF) 250 MCG/5ML IJ SOLN
INTRAMUSCULAR | Status: DC | PRN
  Administered 2024-07-13: 100 ug via INTRAVENOUS
  Administered 2024-07-13 (×3): 50 ug via INTRAVENOUS

## 2024-07-13 MED ORDER — LACTATED RINGERS IV SOLN: INTRAVENOUS | Status: DC

## 2024-07-13 MED ORDER — LIDOCAINE 2% (20 MG/ML) 5 ML SYRINGE
INTRAMUSCULAR | Status: DC | PRN
  Administered 2024-07-13: 100 mg via INTRAVENOUS

## 2024-07-13 MED ORDER — ONDANSETRON HCL 4 MG/2ML IJ SOLN
INTRAMUSCULAR | Status: DC | PRN
  Administered 2024-07-13: 4 mg via INTRAVENOUS

## 2024-07-13 MED ORDER — FENTANYL CITRATE (PF) 100 MCG/2ML IJ SOLN
25.0000 ug | INTRAMUSCULAR | Status: DC | PRN
  Administered 2024-07-13: 50 ug via INTRAVENOUS

## 2024-07-13 MED ORDER — HYDROMORPHONE HCL 1 MG/ML IJ SOLN
1.0000 mg | INTRAMUSCULAR | Status: DC | PRN
  Administered 2024-07-14: 1 mg via INTRAVENOUS
  Filled 2024-07-13: qty 1

## 2024-07-13 MED ORDER — ONDANSETRON HCL 4 MG PO TABS: 4.0000 mg | ORAL_TABLET | Freq: Four times a day (QID) | ORAL | Status: DC | PRN

## 2024-07-13 MED ORDER — CHLORHEXIDINE GLUCONATE CLOTH 2 % EX PADS: 6.0000 | MEDICATED_PAD | Freq: Once | CUTANEOUS | Status: DC

## 2024-07-13 MED ORDER — MENTHOL 3 MG MT LOZG: 1.0000 | LOZENGE | OROMUCOSAL | Status: DC | PRN

## 2024-07-13 MED ORDER — PROPOFOL 10 MG/ML IV BOLUS
INTRAVENOUS | Status: DC | PRN
Start: 2024-07-13 — End: 2024-07-13
  Administered 2024-07-13: 150 mg via INTRAVENOUS

## 2024-07-13 MED ORDER — PHENOL 1.4 % MT LIQD: 1.0000 | OROMUCOSAL | Status: DC | PRN

## 2024-07-13 MED ORDER — DEXMEDETOMIDINE HCL IN NACL 80 MCG/20ML IV SOLN
INTRAVENOUS | Status: DC | PRN
  Administered 2024-07-13 (×4): 8 ug via INTRAVENOUS
  Administered 2024-07-13: 4 ug via INTRAVENOUS

## 2024-07-13 MED ORDER — CEFAZOLIN SODIUM-DEXTROSE 3-4 GM/150ML-% IV SOLN
3.0000 g | INTRAVENOUS | Status: AC
  Administered 2024-07-13: 3 g via INTRAVENOUS
  Filled 2024-07-13: qty 150

## 2024-07-13 MED ORDER — SODIUM CHLORIDE 0.9% FLUSH: 3.0000 mL | INTRAVENOUS | Status: DC | PRN

## 2024-07-13 MED ORDER — ROCURONIUM BROMIDE 10 MG/ML (PF) SYRINGE
PREFILLED_SYRINGE | INTRAVENOUS | Status: DC | PRN
  Administered 2024-07-13: 60 mg via INTRAVENOUS
  Administered 2024-07-13: 40 mg via INTRAVENOUS
  Administered 2024-07-13: 20 mg via INTRAVENOUS

## 2024-07-13 MED ORDER — ATENOLOL 25 MG PO TABS
25.0000 mg | ORAL_TABLET | Freq: Two times a day (BID) | ORAL | Status: DC
  Administered 2024-07-13 – 2024-07-15 (×4): 25 mg via ORAL
  Filled 2024-07-13 (×5): qty 1

## 2024-07-13 MED ORDER — 0.9 % SODIUM CHLORIDE (POUR BTL) OPTIME
TOPICAL | Status: DC | PRN
  Administered 2024-07-13: 1000 mL

## 2024-07-13 MED ORDER — FUROSEMIDE 20 MG PO TABS
20.0000 mg | ORAL_TABLET | Freq: Two times a day (BID) | ORAL | Status: DC
  Administered 2024-07-14 – 2024-07-15 (×3): 20 mg via ORAL
  Filled 2024-07-13 (×3): qty 1

## 2024-07-13 MED ORDER — BUPIVACAINE HCL (PF) 0.5 % IJ SOLN
INTRAMUSCULAR | Status: AC
  Filled 2024-07-13: qty 30

## 2024-07-13 MED ORDER — HYDROMORPHONE HCL 1 MG/ML IJ SOLN
INTRAMUSCULAR | Status: DC | PRN
  Administered 2024-07-13: .5 mg via INTRAVENOUS

## 2024-07-13 MED ORDER — BACITRACIN ZINC 500 UNIT/GM EX OINT
TOPICAL_OINTMENT | CUTANEOUS | Status: AC
  Filled 2024-07-13: qty 28.35

## 2024-07-13 MED ORDER — SUGAMMADEX SODIUM 200 MG/2ML IV SOLN
INTRAVENOUS | Status: DC | PRN
  Administered 2024-07-13: 300 mg via INTRAVENOUS

## 2024-07-13 MED ORDER — SODIUM CHLORIDE 0.9% FLUSH
3.0000 mL | Freq: Two times a day (BID) | INTRAVENOUS | Status: DC
  Administered 2024-07-13 – 2024-07-15 (×4): 3 mL via INTRAVENOUS

## 2024-07-13 MED ORDER — PHENYLEPHRINE 80 MCG/ML (10ML) SYRINGE FOR IV PUSH (FOR BLOOD PRESSURE SUPPORT)
PREFILLED_SYRINGE | INTRAVENOUS | Status: AC
  Filled 2024-07-13: qty 10

## 2024-07-13 MED ORDER — ALUM & MAG HYDROXIDE-SIMETH 200-200-20 MG/5ML PO SUSP: 30.0000 mL | Freq: Four times a day (QID) | ORAL | Status: DC | PRN

## 2024-07-13 MED ORDER — ROCURONIUM BROMIDE 10 MG/ML (PF) SYRINGE
PREFILLED_SYRINGE | INTRAVENOUS | Status: AC
Start: 2024-07-13 — End: 2024-07-13
  Filled 2024-07-13: qty 10

## 2024-07-13 MED ORDER — HYDROMORPHONE HCL 1 MG/ML IJ SOLN
INTRAMUSCULAR | Status: AC
  Filled 2024-07-13: qty 0.5

## 2024-07-13 MED ORDER — PROPOFOL 500 MG/50ML IV EMUL
INTRAVENOUS | Status: DC | PRN
  Administered 2024-07-13: 20 ug/kg/min via INTRAVENOUS

## 2024-07-13 MED ORDER — BISACODYL 10 MG RE SUPP: 10.0000 mg | Freq: Every day | RECTAL | Status: DC | PRN

## 2024-07-13 MED ORDER — ACETAMINOPHEN 10 MG/ML IV SOLN
INTRAVENOUS | Status: AC
  Filled 2024-07-13: qty 100

## 2024-07-13 MED ORDER — POTASSIUM CHLORIDE CRYS ER 20 MEQ PO TBCR
20.0000 meq | EXTENDED_RELEASE_TABLET | Freq: Two times a day (BID) | ORAL | Status: DC
  Administered 2024-07-13 – 2024-07-15 (×4): 20 meq via ORAL
  Filled 2024-07-13 (×4): qty 1

## 2024-07-13 MED ORDER — ACETAMINOPHEN 500 MG PO TABS: 1000.0000 mg | ORAL_TABLET | Freq: Once | ORAL | Status: DC

## 2024-07-13 MED ORDER — ONDANSETRON HCL 4 MG/2ML IJ SOLN
4.0000 mg | Freq: Four times a day (QID) | INTRAMUSCULAR | Status: DC | PRN
  Administered 2024-07-14: 4 mg via INTRAVENOUS
  Filled 2024-07-13: qty 2

## 2024-07-13 MED ORDER — ACETAMINOPHEN 325 MG PO TABS
650.0000 mg | ORAL_TABLET | ORAL | Status: DC | PRN
  Administered 2024-07-14 – 2024-07-15 (×2): 650 mg via ORAL
  Filled 2024-07-13 (×3): qty 2

## 2024-07-13 MED ORDER — CEFAZOLIN SODIUM-DEXTROSE 2-4 GM/100ML-% IV SOLN
2.0000 g | Freq: Three times a day (TID) | INTRAVENOUS | Status: AC
  Administered 2024-07-14 (×2): 2 g via INTRAVENOUS
  Filled 2024-07-13 (×2): qty 100

## 2024-07-13 MED ORDER — PROPOFOL 10 MG/ML IV BOLUS
INTRAVENOUS | Status: AC
  Filled 2024-07-13: qty 20

## 2024-07-13 MED ORDER — OXYCODONE HCL 5 MG PO TABS: 5.0000 mg | ORAL_TABLET | Freq: Once | ORAL | Status: DC | PRN

## 2024-07-13 MED ORDER — SODIUM CHLORIDE 0.9 % IV SOLN: 12.5000 mg | INTRAVENOUS | Status: DC | PRN

## 2024-07-13 MED ORDER — FENTANYL CITRATE (PF) 100 MCG/2ML IJ SOLN
INTRAMUSCULAR | Status: AC
  Filled 2024-07-13: qty 2

## 2024-07-13 MED ORDER — MIDAZOLAM HCL 2 MG/2ML IJ SOLN
INTRAMUSCULAR | Status: AC
  Filled 2024-07-13: qty 2

## 2024-07-13 MED ORDER — METHOCARBAMOL 1000 MG/10ML IJ SOLN: 500.0000 mg | Freq: Four times a day (QID) | INTRAMUSCULAR | Status: DC | PRN

## 2024-07-13 MED ORDER — ACETAMINOPHEN 650 MG RE SUPP: 650.0000 mg | RECTAL | Status: DC | PRN

## 2024-07-13 MED ORDER — LIDOCAINE-EPINEPHRINE 1 %-1:100000 IJ SOLN
INTRAMUSCULAR | Status: AC
  Filled 2024-07-13: qty 1

## 2024-07-13 MED ORDER — PHENYLEPHRINE HCL-NACL 20-0.9 MG/250ML-% IV SOLN
INTRAVENOUS | Status: DC | PRN
  Administered 2024-07-13: 20 ug/min via INTRAVENOUS

## 2024-07-13 MED ORDER — ONDANSETRON HCL 4 MG/2ML IJ SOLN
INTRAMUSCULAR | Status: AC
Start: 2024-07-13 — End: 2024-07-13
  Filled 2024-07-13: qty 2

## 2024-07-13 MED ORDER — CHLORHEXIDINE GLUCONATE 0.12 % MT SOLN
OROMUCOSAL | Status: AC
  Administered 2024-07-13: 15 mL via OROMUCOSAL
  Filled 2024-07-13: qty 15

## 2024-07-13 MED ORDER — LIDOCAINE 2% (20 MG/ML) 5 ML SYRINGE
INTRAMUSCULAR | Status: AC
  Filled 2024-07-13: qty 5

## 2024-07-13 MED ORDER — AMISULPRIDE (ANTIEMETIC) 5 MG/2ML IV SOLN: 10.0000 mg | Freq: Once | INTRAVENOUS | Status: DC | PRN

## 2024-07-13 MED ORDER — THROMBIN 5000 UNITS EX KIT
PACK | CUTANEOUS | Status: AC
  Filled 2024-07-13: qty 1

## 2024-07-13 MED ORDER — PREDNISONE 5 MG PO TABS
15.0000 mg | ORAL_TABLET | Freq: Every day | ORAL | Status: DC
  Administered 2024-07-14 – 2024-07-15 (×2): 15 mg via ORAL
  Filled 2024-07-13 (×2): qty 1

## 2024-07-13 MED ORDER — OXYCODONE-ACETAMINOPHEN 5-325 MG PO TABS
1.0000 | ORAL_TABLET | Freq: Four times a day (QID) | ORAL | Status: DC | PRN
  Administered 2024-07-13 – 2024-07-14 (×2): 2 via ORAL
  Filled 2024-07-13 (×2): qty 2

## 2024-07-13 MED ORDER — SODIUM CHLORIDE 0.9 % IV SOLN
250.0000 mL | INTRAVENOUS | Status: AC
  Administered 2024-07-14: 250 mL via INTRAVENOUS

## 2024-07-13 MED ORDER — LIDOCAINE-EPINEPHRINE 1 %-1:100000 IJ SOLN
INTRAMUSCULAR | Status: DC | PRN
  Administered 2024-07-13: 5 mL

## 2024-07-13 MED ORDER — ACETAMINOPHEN 10 MG/ML IV SOLN
INTRAVENOUS | Status: DC | PRN
  Administered 2024-07-13: 1000 mg via INTRAVENOUS

## 2024-07-13 MED ORDER — MIDAZOLAM HCL 2 MG/2ML IJ SOLN
INTRAMUSCULAR | Status: DC | PRN
  Administered 2024-07-13: 2 mg via INTRAVENOUS

## 2024-07-13 MED ORDER — FLEET ENEMA RE ENEM: 1.0000 | ENEMA | Freq: Once | RECTAL | Status: DC | PRN

## 2024-07-13 MED ORDER — POLYETHYLENE GLYCOL 3350 17 G PO PACK: 17.0000 g | PACK | Freq: Every day | ORAL | Status: DC | PRN

## 2024-07-13 MED ORDER — CHLORHEXIDINE GLUCONATE 0.12 % MT SOLN: 15.0000 mL | Freq: Once | OROMUCOSAL | Status: AC

## 2024-07-13 MED ORDER — DOCUSATE SODIUM 100 MG PO CAPS
100.0000 mg | ORAL_CAPSULE | Freq: Two times a day (BID) | ORAL | Status: DC
  Administered 2024-07-14 (×2): 100 mg via ORAL
  Filled 2024-07-13 (×3): qty 1

## 2024-07-13 MED ORDER — ORAL CARE MOUTH RINSE: 15.0000 mL | Freq: Once | OROMUCOSAL | Status: AC

## 2024-07-13 MED ORDER — SENNA 8.6 MG PO TABS
1.0000 | ORAL_TABLET | Freq: Two times a day (BID) | ORAL | Status: DC
  Administered 2024-07-14 (×2): 8.6 mg via ORAL
  Filled 2024-07-13 (×3): qty 1

## 2024-07-13 SURGICAL SUPPLY — 56 items
BAG COUNTER SPONGE SURGICOUNT (BAG) ×1 IMPLANT
BASKET BONE COLLECTION (BASKET) ×1 IMPLANT
BLADE BONE MILL MEDIUM (MISCELLANEOUS) ×1 IMPLANT
BLADE CLIPPER SURG (BLADE) IMPLANT
BUR MATCHSTICK NEURO 3.0 LAGG (BURR) ×1 IMPLANT
CANISTER SUCTION 3000ML PPV (SUCTIONS) ×1 IMPLANT
CNTNR URN SCR LID CUP LEK RST (MISCELLANEOUS) ×1 IMPLANT
COVER BACK TABLE 60X90IN (DRAPES) ×1 IMPLANT
DERMABOND ADVANCED .7 DNX12 (GAUZE/BANDAGES/DRESSINGS) ×1 IMPLANT
DEVICE DISSECT PLASMABLAD 3.0S (MISCELLANEOUS) ×1 IMPLANT
DRAPE C-ARM 42X72 X-RAY (DRAPES) ×2 IMPLANT
DRAPE HALF SHEET 40X57 (DRAPES) IMPLANT
DRAPE LAPAROTOMY 100X72X124 (DRAPES) ×1 IMPLANT
DRSG OPSITE POSTOP 4X6 (GAUZE/BANDAGES/DRESSINGS) IMPLANT
DURAPREP 26ML APPLICATOR (WOUND CARE) ×1 IMPLANT
DURASEAL APPLICATOR TIP (TIP) IMPLANT
DURASEAL SPINE SEALANT 3ML (MISCELLANEOUS) IMPLANT
ELECTRODE REM PT RTRN 9FT ADLT (ELECTROSURGICAL) ×1 IMPLANT
GAUZE 4X4 16PLY ~~LOC~~+RFID DBL (SPONGE) IMPLANT
GAUZE SPONGE 4X4 12PLY STRL (GAUZE/BANDAGES/DRESSINGS) ×1 IMPLANT
GLOVE BIOGEL PI IND STRL 8.5 (GLOVE) ×2 IMPLANT
GLOVE ECLIPSE 8.5 STRL (GLOVE) ×2 IMPLANT
GOWN STRL REUS W/ TWL LRG LVL3 (GOWN DISPOSABLE) IMPLANT
GOWN STRL REUS W/ TWL XL LVL3 (GOWN DISPOSABLE) IMPLANT
GOWN STRL REUS W/TWL 2XL LVL3 (GOWN DISPOSABLE) ×2 IMPLANT
GRAFT BN 5X1XSPNE CVD POST DBM (Bone Implant) IMPLANT
HEMOSTAT POWDER KIT SURGIFOAM (HEMOSTASIS) ×1 IMPLANT
KIT BASIN OR (CUSTOM PROCEDURE TRAY) ×1 IMPLANT
KIT GRAFTMAG DEL NEURO DISP (NEUROSURGERY SUPPLIES) IMPLANT
KIT TURNOVER KIT B (KITS) ×1 IMPLANT
MILL BONE PREP (MISCELLANEOUS) ×1 IMPLANT
NDL HYPO 22X1.5 SAFETY MO (MISCELLANEOUS) ×1 IMPLANT
NEEDLE HYPO 22X1.5 SAFETY MO (MISCELLANEOUS) ×1 IMPLANT
PACK LAMINECTOMY NEURO (CUSTOM PROCEDURE TRAY) ×1 IMPLANT
PAD ARMBOARD POSITIONER FOAM (MISCELLANEOUS) ×3 IMPLANT
PATTIES SURGICAL .5 X1 (DISPOSABLE) ×1 IMPLANT
ROD RELINE LORDOTIC 5.5X45 (Rod) IMPLANT
SCREW LOCK RELINE 5.5 TULIP (Screw) IMPLANT
SCREW PA TFNA RELINE 5.5X45 (Screw) IMPLANT
SOLN 0.9% NACL 1000 ML (IV SOLUTION) ×1 IMPLANT
SOLN 0.9% NACL POUR BTL 1000ML (IV SOLUTION) ×1 IMPLANT
SOLN STERILE WATER 1000 ML (IV SOLUTION) ×1 IMPLANT
SOLN STERILE WATER BTL 1000 ML (IV SOLUTION) ×1 IMPLANT
SPACER SUSTAIN RT TI 8X22X8 4D (Spacer) IMPLANT
SPIKE FLUID TRANSFER (MISCELLANEOUS) ×1 IMPLANT
SPONGE SURGIFOAM ABS GEL 100 (HEMOSTASIS) IMPLANT
SPONGE T-LAP 4X18 ~~LOC~~+RFID (SPONGE) IMPLANT
SUT PROLENE 6 0 BV (SUTURE) IMPLANT
SUT VIC AB 1 CT1 18XBRD ANBCTR (SUTURE) ×1 IMPLANT
SUT VIC AB 2-0 CP2 18 (SUTURE) ×1 IMPLANT
SUT VIC AB 3-0 SH 8-18 (SUTURE) ×1 IMPLANT
SUT VIC AB 4-0 RB1 18 (SUTURE) ×1 IMPLANT
SYR 3ML LL SCALE MARK (SYRINGE) ×4 IMPLANT
TOWEL GREEN STERILE (TOWEL DISPOSABLE) ×1 IMPLANT
TOWEL GREEN STERILE FF (TOWEL DISPOSABLE) ×1 IMPLANT
TRAY FOLEY MTR SLVR 16FR STAT (SET/KITS/TRAYS/PACK) ×1 IMPLANT

## 2024-07-13 NOTE — Anesthesia Procedure Notes (Signed)
 Procedure Name: Intubation Date/Time: 07/13/2024 6:41 PM  Performed by: Hedy Jarred, CRNAPre-anesthesia Checklist: Patient identified, Emergency Drugs available, Suction available, Patient being monitored and Timeout performed Patient Re-evaluated:Patient Re-evaluated prior to induction Oxygen Delivery Method: Circle system utilized Preoxygenation: Pre-oxygenation with 100% oxygen Induction Type: IV induction, Rapid sequence and Cricoid Pressure applied Ventilation: Mask ventilation without difficulty Laryngoscope Size: Glidescope and 4 Grade View: Grade I Tube type: Subglottic suction tube Tube size: 8.0 mm Number of attempts: 1 Airway Equipment and Method: Stylet and Video-laryngoscopy Placement Confirmation: ETT inserted through vocal cords under direct vision, breath sounds checked- equal and bilateral and CO2 detector Secured at: 24 cm Tube secured with: Tape Dental Injury: Teeth and Oropharynx as per pre-operative assessment

## 2024-07-13 NOTE — H&P (Signed)
 Gregory Hinton is an 58 y.o. male.   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  He now has significant spondylitic disease at the T10-T11 level.  Has been advised regarding the need for surgery there to decompress and stabilize just that joint he is admitted for that procedure now  Past Medical History:  Diagnosis Date   Adrenal insufficiency    Anemia    Anxiety    Arthritis    Back pain    had 3 fusions surgeries   CIDP (chronic inflammatory demyelinating polyneuropathy) (HCC)    Coronary artery disease    H/O cardiovascular stress test 2014   admitted at hosp. overnight for chest pain, stress/echo test wnl   Hypertension    PONV (postoperative nausea and vomiting)    Rheumatoid arthritis (HCC)    manages by Dr. Dolphus, rec'd Orencia  weekly for 2 months, switched from Humira     Past Surgical History:  Procedure Laterality Date   ANTERIOR LAT LUMBAR FUSION Left 12/26/2018   Procedure: Left Lumbar Two-Three Anterolateral decompression/interbody fusion/lateral plate;  Surgeon: Colon Shove, MD;  Location: [facility] OR;  Service: Neurosurgery;  Laterality: Left;  Anterolateral   BACK SURGERY  8002,7996   lumbar fusion 3 times   BACK SURGERY  2022   CARDIAC CATHETERIZATION  11/05/2021   CARPAL TUNNEL RELEASE Right 2000   and left side 2 years ago with left ulnar surgery   CARPAL TUNNEL RELEASE Left    COLONOSCOPY  07/16/2008   Mild pancolonic diverticulosis. Otherwise normal colonoscopy   ESOPHAGOGASTRODUODENOSCOPY  07/16/2008   Mild gastroduodenitis. Otherwise normal EGD   HERNIA REPAIR Right 2001   inguinal    REPLACEMENT TOTAL KNEE Left 05/2020    Family History  Problem Relation Age of Onset   Aneurysm Mother    Leukemia Father     Healthy Daughter    Healthy Son    Social History:  reports that he quit smoking about 26 years ago. His smoking use included cigarettes. He started smoking about 31 years ago. He has a 1.3 pack-year smoking history. He has never used smokeless tobacco. He reports that he does not currently use alcohol. He reports that he does not use drugs.  Allergies:  Allergies  Allergen Reactions   Atorvastatin     Other Reaction(s): Myalgias, Myalgias (intolerance)   Codeine Nausea Only    Other Reaction(s): GI Intolerance   Egg Protein-Containing Drug Products Diarrhea and Nausea And Vomiting    Other Reaction(s): Not available    No medications prior to admission.    No results found for this or any previous visit (from the past 48 hours). No results found.  Review of Systems  Constitutional:  Positive for activity change.  Musculoskeletal:  Positive for back pain and gait problem.  Neurological:  Positive for weakness and numbness.  All other systems reviewed and are negative.   There were no vitals taken for this visit. Physical Exam Constitutional:      Appearance: Normal appearance. He is obese.  HENT:     Head: Normocephalic and atraumatic.     Right Ear: Tympanic membrane, ear canal and external ear normal.     Left Ear: Tympanic membrane, ear canal and external ear normal.     Nose:  Nose normal.     Mouth/Throat:     Mouth: Mucous membranes are moist.     Pharynx: Oropharynx is clear.  Eyes:     Extraocular Movements: Extraocular movements intact.     Conjunctiva/sclera: Conjunctivae normal.     Pupils: Pupils are equal, round, and reactive to light.  Cardiovascular:     Rate and Rhythm: Normal rate and regular rhythm.     Pulses: Normal pulses.     Heart sounds: Normal heart sounds.  Pulmonary:     Effort: Pulmonary effort is normal.     Breath sounds: Normal breath sounds.  Abdominal:     General: Bowel sounds are normal.     Palpations: Abdomen is soft.   Musculoskeletal:        General: Normal range of motion.     Cervical back: Normal range of motion and neck supple.     Comments: Moderate limitations in range of motion of all joints  Skin:    General: Skin is warm.  Neurological:     Mental Status: He is alert.     Comments: Mild hyperreflexia in the Achilles 3+ Babinski is upgoing.  Psychiatric:        Mood and Affect: Mood normal.        Behavior: Behavior normal.        Thought Content: Thought content normal.        Judgment: Judgment normal.      Assessment/Plan Spondylosis and stenosis with myelopathy T10-T11.  Plan posterior decompression and fixation T10-T11 with transforaminal interbody approach.  Victory JINNY Gens, MD 07/13/2024, 9:00 AM

## 2024-07-13 NOTE — Anesthesia Postprocedure Evaluation (Signed)
 Anesthesia Post Note  Patient: Gregory Hinton  Procedure(s) Performed: THORACIC TEN-ELEVEN TRANSFORAMINAL THORACIC FUSION (Spine Thoracic)     Patient location during evaluation: PACU Anesthesia Type: General Level of consciousness: awake and alert Pain management: pain level controlled Vital Signs Assessment: post-procedure vital signs reviewed and stable Respiratory status: spontaneous breathing, nonlabored ventilation, respiratory function stable and patient connected to nasal cannula oxygen Cardiovascular status: blood pressure returned to baseline and stable Postop Assessment: no apparent nausea or vomiting Anesthetic complications: no   No notable events documented.  Last Vitals:  Vitals:   07/13/24 2230 07/13/24 2252  BP: 123/62 113/66  Pulse: 72 68  Resp: 12 18  Temp: 36.8 C 37.2 C  SpO2: 95% 97%    Last Pain:  Vitals:   07/13/24 2230  TempSrc:   PainSc: Asleep                 [name]

## 2024-07-13 NOTE — Transfer of Care (Signed)
 Immediate Anesthesia Transfer of Care Note  Patient: Gregory Hinton  Procedure(s) Performed: THORACIC TEN-ELEVEN TRANSFORAMINAL THORACIC FUSION (Spine Thoracic)  Patient Location: PACU  Anesthesia Type:General  Level of Consciousness: awake, alert , and oriented  Airway & Oxygen Therapy: Patient Spontanous Breathing and Patient connected to nasal cannula oxygen  Post-op Assessment: Report given to RN, Post -op Vital signs reviewed and stable, and Patient moving all extremities  Post vital signs: Reviewed and stable  Last Vitals:  Vitals Value Taken Time  BP 97/77 07/13/24 21:48  Temp 36.8 C 07/13/24 21:48  Pulse 84 07/13/24 21:56  Resp 20 07/13/24 21:56  SpO2 97 % 07/13/24 21:56  Vitals shown include unfiled device data.  Last Pain:  Vitals:   07/13/24 1105  TempSrc:   PainSc: 7          Complications: No notable events documented.

## 2024-07-13 NOTE — Op Note (Signed)
 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 Postoperative diagnosis: Same Procedure: Bilateral laminotomies discectomy bilateral transforaminal interbody arthrodesis T10-T11 with titanium spacer local autograft posterolateral arthrodesis with local autograft and allograft pedicle screw fixation T10-T11 with fluoroscopic guidance Surgeon: Victory Gens Anesthesia: General endotracheal Indications: Gregory Hinton is a 58 year old individual whose had previous spondylitic disease he has evolved disc herniation in addition to spondylitic changes at the T10-T11 level and he was advised regarding the need for surgical decompression and stabilization at T10-T11.  Procedure: The patient was brought to the operating room supine on the stretcher.  After the smooth induction of general tracheal anesthesia, he was carefully turned prone.  The back was prepped with alcohol DuraPrep and draped in a sterile fashion midline incision was created over T10-T11 which had been localized fluoroscopically prior to prepping the skin.  The incision was taken down to the thoracodorsal fascia which was opened on either side of midline to expose the spinous processes of T10 and T11 out to the region of the facets.  Dissection was taken down to the facet regions and the interlaminar space was well exposed.  Self-retaining retractor was placed deep into the wound.  Then large laminotomies were created first on the left side at the T10-T11 level lateral aspect of the dura was well decompressed and that the space was ultimately identified.  Discectomy was performed on that left side.  Pedicle entry sites were then chosen at T10-T11 and checked fluoroscopically 5.5 x 45 mm pedicle screws were placed into the pedicles at T10 and T11 attention was then turned to the right side where a similar process was carried out a discectomy was completed here on the right side and  through the transpedicular approach we cleaned out the disc as best possible at T10-T11.  Endplates were rongeured smooth and ultimately a spacer was placed which was a 8 mm 2 degree lordotic spacer measuring 22 mm in length.  This was filled with the patient's autograft from the shavings of the bone during the laminotomies.  Then pedicle entry sites were chosen at T10-T11 on this site and 5.5 x 45 mm pedicle screws were placed in T10-T11 on the right side the interlaminar spaces were then decorticated on both sides.  45 mm precontoured rods were then used to connect the pedicle screws at T10-T11.  On the left side the interbody space was filled with additional bone graft through a 3 cc syringe.  This was tamped into position.  Once an adequate packing was performed lateral gutters were then packed with additional bone and 2 Magnifuse 5 cc allograft were packed into the interlaminar space between T10 and T11.  Once this was completed and final radiographs were obtained the wound was checked for hemostasis and then the thoracodorsal fascia was closed with #1 Vicryl in interrupted fashion 2-0 Vicryl was used in the subcutaneous tissues 3-0 Vicryl and 4-0 Vicryl in the subcuticular skin.  Dermabond was placed on the skin.  Blood loss for the procedure was estimated 250 cc.  Patient was returned to recovery room in stable condition.

## 2024-07-14 DIAGNOSIS — M4804 Spinal stenosis, thoracic region: Secondary | ICD-10-CM | POA: Diagnosis not present

## 2024-07-14 MED ORDER — HYDROMORPHONE HCL 2 MG PO TABS: 2.0000 mg | ORAL_TABLET | ORAL | 0 refills | Status: DC | PRN

## 2024-07-14 MED ORDER — TRAMADOL HCL 50 MG PO TABS: 50.0000 mg | ORAL_TABLET | Freq: Four times a day (QID) | ORAL | 0 refills | Status: AC | PRN

## 2024-07-14 MED ORDER — SODIUM CHLORIDE 0.9% FLUSH: 10.0000 mL | INTRAVENOUS | Status: DC | PRN

## 2024-07-14 MED ORDER — HYDROMORPHONE HCL 2 MG PO TABS
2.0000 mg | ORAL_TABLET | ORAL | Status: DC | PRN
  Administered 2024-07-14 – 2024-07-15 (×7): 2 mg via ORAL
  Filled 2024-07-14 (×7): qty 1

## 2024-07-14 MED ORDER — METHOCARBAMOL 500 MG PO TABS: 500.0000 mg | ORAL_TABLET | Freq: Four times a day (QID) | ORAL | 3 refills | Status: AC | PRN

## 2024-07-14 MED ORDER — DEXAMETHASONE 1 MG PO TABS: ORAL_TABLET | ORAL | 0 refills | Status: AC

## 2024-07-14 MED ORDER — TRAMADOL HCL 50 MG PO TABS
50.0000 mg | ORAL_TABLET | Freq: Four times a day (QID) | ORAL | Status: DC | PRN
  Administered 2024-07-15: 50 mg via ORAL
  Filled 2024-07-14: qty 1

## 2024-07-14 MED ORDER — DEXAMETHASONE 4 MG PO TABS
2.0000 mg | ORAL_TABLET | Freq: Two times a day (BID) | ORAL | Status: DC
  Administered 2024-07-14 – 2024-07-15 (×3): 2 mg via ORAL
  Filled 2024-07-14 (×3): qty 1

## 2024-07-14 NOTE — Evaluation (Signed)
 Physical Therapy Evaluation Patient Details Name: Gregory Hinton MRN: [id number] DOB: [birth year] Today's Date: 07/14/2024  History of Present Illness  Gregory Hinton is a 58 y.o. male who presented to [facility] 07/13/24 for surgical decompression and stabilization at T10-T11. Pt with evolved disc herniation in addition to spondylitic changes at the T10-T11 level. 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 Pt s/p T10-11 bilateral laminotomies/discectomy, transforaminal interbody arthrodesis, titanium spacer local autograft posterolateral arthrodesis with local autograft and allograft pedicle screw fixation 10/9. 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   Clinical Impression  Pt admitted with above diagnosis. PTA, pt was modI for functional mobility using rollator, independent with ADLs/IADLs, driving, and working. He lives with his wife in a two story house with a ramped entrance. Pt reports he is able to reside on the main level. Pt currently with functional limitations due to the deficits listed below (see PT Problem List). He required minA for bed mobility, CGA for transfers using RW, and CGA for gait using RW. Educated pt on back precautions with him recalling 3/3 when asked at end of session. He is currently limited by pain and decreased activity tolerance. Pt will benefit from acute skilled PT to increase his independence and safety with mobility to allow discharge. Recommend HHPT to increase strength, improve balance, advance activity tolerance, decrease fall risk, and optimize safety within the home environment.     If plan is discharge home, recommend the following: A little help with walking and/or transfers;A little help with bathing/dressing/bathroom;Assistance with cooking/housework;Assist for transportation;Help with stairs or ramp for entrance   Can travel by private  vehicle        Equipment Recommendations None recommended by PT  Recommendations for Other Services       Functional Status Assessment Patient has had a recent decline in their functional status and demonstrates the ability to make significant improvements in function in a reasonable and predictable amount of time.     Precautions / Restrictions Precautions Precautions: Back;Fall Precaution Booklet Issued: Yes (comment) Recall of Precautions/Restrictions: Intact Precaution/Restrictions Comments: No orders for formal back precautions, followed for pt comfort and out of caution. Educated pt on no bending, lifting >10lbs, or twisting. No brace needed. Restrictions Weight Bearing Restrictions Per Provider Order: No      Mobility  Bed Mobility Overal bed mobility: Needs Assistance Bed Mobility: Rolling, Sidelying to Sit Rolling: Min assist, Used rails Sidelying to sit: Min assist, HOB elevated, Used rails       General bed mobility comments: Educated pt on log roll technique. Cues for sequencing. Assist to roll, bring BLE off EOB, and elevate trunk.    Transfers Overall transfer level: Needs assistance Equipment used: Rolling walker (2 wheels) Transfers: Sit to/from Stand, Bed to chair/wheelchair/BSC Sit to Stand: Contact guard assist, From elevated surface   Step pivot transfers: Contact guard assist       General transfer comment: Pt stood from raised bed height. Cued proper hjand placement using RW. Powered up with light assist. Transferred to recliner chair. Good eccentric control, reaching back for armrest.    Ambulation/Gait Ambulation/Gait assistance: Contact guard assist Gait Distance (Feet): 115 Feet Assistive device: Rolling walker (2 wheels) Gait Pattern/deviations: Step-through pattern, Decreased stride length Gait velocity: decreased Gait velocity interpretation: <1.8 ft/sec, indicate of risk for recurrent falls   General Gait Details: Pt ambulated with  a reciprocal gait pattern,  even weight shift, and good foot clearence. He maintained upright posture with good proximity to RW. Pt manuevered within room/hallway well, no LOB.  Stairs            Wheelchair Mobility     Tilt Bed    Modified Rankin (Stroke Patients Only)       Balance Overall balance assessment: Needs assistance Sitting-balance support: No upper extremity supported, Feet supported Sitting balance-Leahy Scale: Fair     Standing balance support: Bilateral upper extremity supported, During functional activity, Reliant on assistive device for balance Standing balance-Leahy Scale: Poor                               Pertinent Vitals/Pain Pain Assessment Pain Assessment: 0-10 Pain Score: 8  Pain Location: Back Pain Descriptors / Indicators: Discomfort, Aching, Operative site guarding    Home Living Family/patient expects to be discharged to:: Private residence Living Arrangements: Spouse/significant other (Wife) Available Help at Discharge: Family;Available 24 hours/day (Wife has limited ability to physically assist) Type of Home: House Home Access: Ramped entrance       Home Layout: Two level;Able to live on main level with bedroom/bathroom;Full bath on main level Home Equipment: Cane - single point;Rollator (4 wheels);Shower seat;Grab bars - toilet;Grab bars - tub/shower;Toilet riser;Hand held shower head;Lift chair      Prior Function Prior Level of Function : Independent/Modified Independent;Driving;Working/employed             Mobility Comments: Ambulates using rollator. 3 falls in the past 74mo, one resulted in L shoulder RTC tear. Pt reports difficulty with mobilizing d/t LE pain and weakness. He states he sleeps in a recliner chair. ADLs Comments: Indep with ADLs/IADLs. 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     Extremity/Trunk Assessment   Upper Extremity Assessment Upper Extremity  Assessment: Defer to OT evaluation    Lower Extremity Assessment Lower Extremity Assessment: RLE deficits/detail;LLE deficits/detail RLE Deficits / Details: AROM WFL. Hip strength 3/5, Knee strength 4/5, and ankle strength 5/5. RLE Sensation: history of peripheral neuropathy RLE Coordination: decreased gross motor LLE Deficits / Details: AROM WFL. Hip strength 3/5, Knee strength 4/5, and ankle strength 4/5. Pt c/o numbness in L hip/thigh. LLE Sensation: history of peripheral neuropathy;decreased proprioception LLE Coordination: decreased gross motor    Cervical / Trunk Assessment Cervical / Trunk Assessment: Back Surgery  Communication   Communication Communication: No apparent difficulties    Cognition Arousal: Alert Behavior During Therapy: WFL for tasks assessed/performed   PT - Cognitive impairments: No apparent impairments                       PT - Cognition Comments: Pt A,Ox4 Following commands: Intact       Cueing Cueing Techniques: Verbal cues     General Comments General comments (skin integrity, edema, etc.): VSS on RA    Exercises     Assessment/Plan    PT Assessment Patient needs continued PT services  PT Problem List Decreased strength;Decreased activity tolerance;Decreased balance;Decreased mobility;Impaired sensation;Pain;Obesity       PT Treatment Interventions DME instruction;Gait training;Functional mobility training;Therapeutic activities;Therapeutic exercise;Balance training;Patient/family education    PT Goals (Current goals can be found in the Care Plan section)  Acute Rehab PT Goals Patient Stated Goal: Return Home PT Goal Formulation: With patient Time For Goal Achievement: 07/28/24 Potential to Achieve Goals: Good    Frequency Min  5X/week     Co-evaluation               AM-PAC PT 6 Clicks Mobility  Outcome Measure Help needed turning from your back to your side while in a flat bed without using bedrails?: A  Little Help needed moving from lying on your back to sitting on the side of a flat bed without using bedrails?: A Little Help needed moving to and from a bed to a chair (including a wheelchair)?: A Little Help needed standing up from a chair using your arms (e.g., wheelchair or bedside chair)?: A Little Help needed to walk in hospital room?: A Little Help needed climbing 3-5 steps with a railing? : A Lot 6 Click Score: 17    End of Session Equipment Utilized During Treatment: Gait belt Activity Tolerance: Patient tolerated treatment well Patient left: in chair;with call bell/phone within reach Nurse Communication: Mobility status;Other (comment);Patient requests pain meds (no green chair alarm box present in room) PT Visit Diagnosis: Pain;Difficulty in walking, not elsewhere classified (R26.2);Other abnormalities of gait and mobility (R26.89);Unsteadiness on feet (R26.81) Pain - Right/Left: Left Pain - part of body: Hip (Back)    Time: 9171-9144 PT Time Calculation (min) (ACUTE ONLY): 27 min   Charges:   PT Evaluation $PT Eval Low Complexity: 1 Low PT Treatments $Gait Training: 8-22 mins PT General Charges $$ ACUTE PT VISIT: 1 Visit         Randall SAUNDERS, PT, DPT Acute Rehabilitation Services Office: [phone] Secure Chat Preferred  Delon CHRISTELLA Callander 07/14/2024, 9:42 AM

## 2024-07-14 NOTE — Evaluation (Signed)
 Occupational Therapy Evaluation Patient Details Name: Gregory Hinton MRN: [id number] DOB: May 18, 1966 Today's Date: 07/14/2024   History of Present Illness   Gregory Hinton is a 58 y.o. male who presented to [facility] 07/13/24 for surgical decompression and stabilization at T10-T11. Pt with evolved disc herniation in addition to spondylitic changes at the T10-T11 level. 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 Pt s/p T10-11 bilateral laminotomies/discectomy, transforaminal interbody arthrodesis, titanium spacer local autograft posterolateral arthrodesis with local autograft and allograft pedicle screw fixation 10/9. 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     Clinical Impressions Pt was functioning mod I in mobility with a rollator and independently in ADLs, driving and working prior to admission. Presents with post operative pain. Standing and walking mod I with RW. Pt needs up to moderate assistance for LB dressing. Wife will assist with donning compression socks and pt has a sock aide, slip in shoes and long handled bath sponge at home. Educated in compensatory strategies for ADLs and reinforced back precautions with pt and wife verbalizing understanding. Pt is familiar with precautions from multiple previous back surgeries. No further OT needs.      If plan is discharge home, recommend the following:   Assistance with cooking/housework;A lot of help with bathing/dressing/bathroom     Functional Status Assessment   Patient has had a recent decline in their functional status and demonstrates the ability to make significant improvements in function in a reasonable and predictable amount of time.     Equipment Recommendations   None recommended by OT     Recommendations for Other Services         Precautions/Restrictions   Precautions Precautions:  Back;Fall Precaution Booklet Issued: Yes (comment) Recall of Precautions/Restrictions: Intact Precaution/Restrictions Comments: pt familiar with back precautions from previous surgeries Restrictions Weight Bearing Restrictions Per Provider Order: No     Mobility Bed Mobility               General bed mobility comments: sleeps in chair at home, received in chair    Transfers Overall transfer level: Modified independent Equipment used: Rolling walker (2 wheels)               General transfer comment: good technique, increased time      Balance Overall balance assessment: Needs assistance   Sitting balance-Leahy Scale: Good     Standing balance support: Bilateral upper extremity supported, During functional activity, Reliant on assistive device for balance Standing balance-Leahy Scale: Poor Standing balance comment: relies on RW                           ADL either performed or assessed with clinical judgement   ADL Overall ADL's : Needs assistance/impaired Eating/Feeding: Independent   Grooming: Modified independent;Standing   Upper Body Bathing: Set up;Sitting;With adaptive equipment   Lower Body Bathing: Set up;Sit to/from stand;With adaptive equipment   Upper Body Dressing : Set up;Sitting   Lower Body Dressing: Moderate assistance;Sit to/from stand Lower Body Dressing Details (indicate cue type and reason): has a sock aide, will rely on wife for compression socks Toilet Transfer: Modified Independent;Rolling walker (2 wheels)   Toileting- Clothing Manipulation and Hygiene: Modified independent;Sit to/from stand       Functional mobility during ADLs: Modified independent;Rolling walker (2 wheels)       Vision Ability to See  in Adequate Light: 0 Adequate Patient Visual Report: No change from baseline       Perception         Praxis         Pertinent Vitals/Pain Pain Assessment Pain Assessment: 0-10 Pain Score: 6  Pain  Location: Back Pain Descriptors / Indicators: Discomfort, Aching, Operative site guarding     Extremity/Trunk Assessment Upper Extremity Assessment Upper Extremity Assessment: Overall WFL for tasks assessed   Lower Extremity Assessment Lower Extremity Assessment: Defer to PT evaluation   Cervical / Trunk Assessment Cervical / Trunk Assessment: Back Surgery   Communication Communication Communication: No apparent difficulties   Cognition Arousal: Alert Behavior During Therapy: WFL for tasks assessed/performed Cognition: No apparent impairments                               Following commands: Intact       Cueing  General Comments   Cueing Techniques: Verbal cues      Exercises     Shoulder Instructions      Home Living Family/patient expects to be discharged to:: Private residence Living Arrangements: Spouse/significant other Available Help at Discharge: Family;Available 24 hours/day Type of Home: House Home Access: Ramped entrance     Home Layout: Two level;Able to live on main level with bedroom/bathroom;Full bath on main level     Bathroom Shower/Tub: Walk-in shower (being remodeled barrier free)   Bathroom Toilet: Handicapped height Bathroom Accessibility: Yes   Home Equipment: Cane - single point;Rollator (4 wheels);Shower seat;Grab bars - toilet;Grab bars - tub/shower;Toilet riser;Hand held shower head;Lift chair          Prior Functioning/Environment Prior Level of Function : Independent/Modified Independent;Driving;Working/employed             Mobility Comments: Ambulates using rollator. 3 falls in the past 89mo, one resulted in L shoulder RTC tear. Pt reports difficulty with mobilizing d/t LE pain and weakness. He states he sleeps in a recliner chair. ADLs Comments: Indep with ADLs/IADLs. 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    OT Problem List:     OT Treatment/Interventions:         OT Goals(Current goals can be found in the care plan section)   Acute Rehab OT Goals OT Goal Formulation: With patient Time For Goal Achievement: 07/28/24 Potential to Achieve Goals: Good   OT Frequency:       Co-evaluation              AM-PAC OT 6 Clicks Daily Activity     Outcome Measure Help from another person eating meals?: None Help from another person taking care of personal grooming?: None Help from another person toileting, which includes using toliet, bedpan, or urinal?: None Help from another person bathing (including washing, rinsing, drying)?: None Help from another person to put on and taking off regular upper body clothing?: None Help from another person to put on and taking off regular lower body clothing?: A Lot 6 Click Score: 22   End of Session Equipment Utilized During Treatment: Rolling walker (2 wheels)  Activity Tolerance: Patient tolerated treatment well Patient left: in chair;with call bell/phone within reach;with family/visitor present  OT Visit Diagnosis: Pain;Other abnormalities of gait and mobility (R26.89)                Time: 8585-8558 OT Time Calculation (min): 27 min Charges:  OT General Charges $OT Visit:  1 Visit OT Evaluation $OT Eval Low Complexity: 1 Low OT Treatments $Self Care/Home Management : 8-22 mins Mliss HERO, OTR/L Acute Rehabilitation Services Office: [phone]   Kennth Mliss Helling 07/14/2024, 3:14 PM

## 2024-07-14 NOTE — Plan of Care (Signed)

## 2024-07-14 NOTE — Discharge Summary (Addendum)
 Physician Discharge Summary  Patient ID: Gregory Hinton MRN: [id number] DOB/AGE: 11-Sep-1966 58 y.o.  Admit date: 07/13/2024 Discharge date: 07/15/2024  Admission Diagnoses: Thoracic stenosis T10-T11 with myelopathy  Discharge Diagnoses: Thoracic stenosis T10-T11 with myelopathy.  Rheumatoid arthritis.  Addison's syndrome Principal Problem:   Thoracic stenosis Active Problems:   Herniated nucleus pulposus with myelopathy, thoracic   Discharged Condition: good  Hospital Course: Patient tolerated surgery well  Consults: None  Significant Diagnostic Studies: None  Treatments: surgery: See op note  Discharge Exam: Blood pressure 117/64, pulse 72, temperature 98.1 F (36.7 C), temperature source Oral, resp. rate 18, height 6' 3 (1.905 m), weight 122.5 kg, SpO2 97%. Incision is clean and dry Station and gait are intact  Disposition: Discharge disposition: 01-Home or Self Care       Discharge Instructions     Call MD for:  redness, tenderness, or signs of infection (pain, swelling, redness, odor or green/yellow discharge around incision site)   Complete by: As directed    Call MD for:  redness, tenderness, or signs of infection (pain, swelling, redness, odor or green/yellow discharge around incision site)   Complete by: As directed    Call MD for:  severe uncontrolled pain   Complete by: As directed    Call MD for:  severe uncontrolled pain   Complete by: As directed    Call MD for:  temperature >100.4   Complete by: As directed    Call MD for:  temperature >100.4   Complete by: As directed    Diet - low sodium heart healthy   Complete by: As directed    Diet - low sodium heart healthy   Complete by: As directed    Discharge wound care:   Complete by: As directed    Okay to shower. Do not apply salves or appointments to incision. No heavy lifting with the upper extremities greater than 10 pounds. May resume driving when not requiring pain medication and patient feels  comfortable with doing so.   Discharge wound care:   Complete by: As directed    Okay to shower. Do not apply salves or appointments to incision. No heavy lifting with the upper extremities greater than 10 pounds. May resume driving when not requiring pain medication and patient feels comfortable with doing so.   Incentive spirometry RT   Complete by: As directed    Increase activity slowly   Complete by: As directed    Increase activity slowly   Complete by: As directed       Allergies as of 07/14/2024       Reactions   Atorvastatin    Other Reaction(s): Myalgias, Myalgias (intolerance)   Codeine Nausea Only   Other Reaction(s): GI Intolerance   Egg Protein-containing Drug Products Diarrhea, Nausea And Vomiting   Other Reaction(s): Not available        Medication List     TAKE these medications    atenolol  25 MG tablet Commonly known as: TENORMIN  Take 25 mg by mouth 2 (two) times daily.   cholecalciferol 1000 units tablet Commonly known as: VITAMIN D Take 1,000 Units by mouth daily.   dexamethasone  1 MG tablet Commonly known as: DECADRON  2 tablets twice daily for 2 days, one tablet twice daily for 2 days, one tablet daily for 2 days.   furosemide  20 MG tablet Commonly known as: LASIX  Take 20 mg by mouth 2 (two) times daily.   HYDROmorphone  2 MG tablet Commonly known as: DILAUDID  Take 1  tablet (2 mg total) by mouth every 4 (four) hours as needed for severe pain (pain score 7-10).   methocarbamol  500 MG tablet Commonly known as: ROBAXIN  Take 1 tablet (500 mg total) by mouth every 6 (six) hours as needed for muscle spasms.   OCTAGAM IV Inject into the vein See admin instructions. Infusion every 3 weeks twice that week   potassium chloride SA 20 MEQ tablet Commonly known as: KLOR-CON M Take 20 mEq by mouth 2 (two) times daily.   predniSONE  10 MG tablet Commonly known as: DELTASONE  Take 1.5 tablets (15 mg total) by mouth daily with breakfast. One tab po  daily, Continue tapering down prednisone  dosage as previously instructed.   Red Yeast Rice Extract 600 MG Caps Take 600 mg by mouth daily.   traMADol  50 MG tablet Commonly known as: Ultram  Take 1 tablet (50 mg total) by mouth every 6 (six) hours as needed for severe pain (pain score 7-10) or moderate pain (pain score 4-6).               Discharge Care Instructions  (From admission, onward)           Start     Ordered   07/14/24 0000  Discharge wound care:       Comments: Okay to shower. Do not apply salves or appointments to incision. No heavy lifting with the upper extremities greater than 10 pounds. May resume driving when not requiring pain medication and patient feels comfortable with doing so.   07/14/24 1824   07/14/24 0000  Discharge wound care:       Comments: Okay to shower. Do not apply salves or appointments to incision. No heavy lifting with the upper extremities greater than 10 pounds. May resume driving when not requiring pain medication and patient feels comfortable with doing so.   07/14/24 1826             Signed: Victory PARAS Josslyn Ciolek 07/14/2024, 6:26 PM

## 2024-07-14 NOTE — Progress Notes (Signed)
 Patient ID: Gregory Hinton, male   DOB: 08/07/1966, 58 y.o.   MRN: [id number] Vital signs are stable Patient is feeling abdominal discomfort this morning likely secondary to some T10 nerve root irritability.  Incision is clean and dry.  Patient is just starting to mobilize and given his size he will likely need an additional day stay in the hospital.  Will continue to monitor his status here plan for discharge tomorrow.  Patient written for tramadol  for pain control.

## 2024-07-15 ENCOUNTER — Other Ambulatory Visit (HOSPITAL_COMMUNITY): Payer: Self-pay

## 2024-07-15 DIAGNOSIS — M4804 Spinal stenosis, thoracic region: Secondary | ICD-10-CM | POA: Diagnosis not present

## 2024-07-15 MED ORDER — HYDROMORPHONE HCL 2 MG PO TABS
2.0000 mg | ORAL_TABLET | ORAL | 0 refills | Status: AC | PRN
  Filled 2024-07-15: qty 30, 5d supply, fill #0

## 2024-07-15 NOTE — Plan of Care (Signed)
   Problem: Education: Goal: Knowledge of General Education information will improve Description: Including pain rating scale, medication(s)/side effects and non-pharmacologic comfort measures Outcome: Progressing   Problem: Clinical Measurements: Goal: Ability to maintain clinical measurements within normal limits will improve Outcome: Progressing   Problem: Activity: Goal: Risk for activity intolerance will decrease Outcome: Progressing   Problem: Coping: Goal: Level of anxiety will decrease Outcome: Progressing   Problem: Pain Managment: Goal: General experience of comfort will improve and/or be controlled Outcome: Progressing

## 2024-07-15 NOTE — Care Management (Cosign Needed)
    Durable Medical Equipment  (From admission, onward)           Start     Ordered   07/15/24 0916  For home use only DME Walker rolling  Once       Question Answer Comment  Walker: With 5 Inch Wheels   Patient needs a walker to treat with the following condition Weakness      07/15/24 0916           Per PT recommendations

## 2024-07-15 NOTE — TOC Transition Note (Signed)
 Transition of Care [facility]) - Discharge Note   Patient Details  Name: Gregory Hinton MRN: [id number] Date of Birth: 02/05/1966  [facility] CM/SW Contact:  Robynn Eileen Hoose, RN Phone Number: 07/15/2024, 10:08 AM   Clinical Narrative:   Patient is being discharged home. Secure message from PT regarding pt needing RW. RW ordered through Epic to [employer].    Final next level of care: Home/Self Care     Patient Goals and CMS Choice            Discharge Placement                       Discharge Plan and Services Additional resources added to the After Visit Summary for                  DME Arranged: Walker rolling DME Agency: [employer] Date DME Agency Contacted: 07/15/24 Time DME Agency Contacted: [phone] Representative spoke with at DME Agency: Sent through [employer]            Social Drivers of Health (SDOH) Interventions SDOH Screenings   Food Insecurity: No Food Insecurity (07/13/2024)  Housing: Low Risk  (07/13/2024)  Transportation Needs: No Transportation Needs (07/13/2024)  Utilities: Not At Risk (07/13/2024)  Tobacco Use: Medium Risk (07/13/2024)     Readmission Risk Interventions     No data to display

## 2024-07-15 NOTE — Progress Notes (Addendum)
 Physical Therapy Treatment Patient Details Name: Gregory Hinton MRN: [id number] DOB: 1966/06/21 Today's Date: 07/15/2024   History of Present Illness Gregory Hinton is a 58 y.o. male who presented to [facility] 07/13/24 for surgical decompression and stabilization at T10-T11. Pt with evolved disc herniation in addition to spondylitic changes at the T10-T11 level. 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 Pt s/p T10-11 bilateral laminotomies/discectomy, transforaminal interbody arthrodesis, titanium spacer local autograft posterolateral arthrodesis with local autograft and allograft pedicle screw fixation 10/9. 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    PT Comments  Pt received in supine and agreeable to session. Pt reports increased back pain today, but is able to tolerate increased gait distance. Pt reports PTA his BLE will give out due to CIDP, so education provided on reducing fall risk, activity progression, and energy conservation. Pt able to use the bathroom without assist. Pt reports feeling more comfortable with RW vs rollator, so equipment recommendations updated after discussion with supervising PT Gregory Hinton. Anticipate pt and family will be able to manage pt's mobility needs at home once medically ready for discharge.     If plan is discharge home, recommend the following: A little help with walking and/or transfers;A little help with bathing/dressing/bathroom;Assistance with cooking/housework;Assist for transportation;Help with stairs or ramp for entrance   Can travel by private vehicle        Equipment Recommendations  None recommended by PT    Recommendations for Other Services       Precautions / Restrictions Precautions Precautions: Back;Fall Recall of Precautions/Restrictions: Intact Precaution/Restrictions Comments: pt familiar with back precautions  from previous surgeries Restrictions Weight Bearing Restrictions Per Provider Order: No     Mobility  Bed Mobility Overal bed mobility: Needs Assistance Bed Mobility: Rolling, Sidelying to Sit Rolling: Min assist, Used rails         General bed mobility comments: Min A for trunk elevation and cues for technique. Pt plans to sleep in recliner at home    Transfers Overall transfer level: Modified independent Equipment used: Rolling walker (2 wheels) Transfers: Sit to/from Stand             General transfer comment: increased time and good hand placement    Ambulation/Gait Ambulation/Gait assistance: Contact guard assist Gait Distance (Feet): 200 Feet Assistive device: Rolling walker (2 wheels) Gait Pattern/deviations: Step-through pattern, Decreased stride length Gait velocity: decreased     General Gait Details: good stability with RW support. Cues for shoulder depression to reduce tension.   Stairs             Wheelchair Mobility     Tilt Bed    Modified Rankin (Stroke Patients Only)       Balance Overall balance assessment: Needs assistance Sitting-balance support: No upper extremity supported, Feet supported Sitting balance-Leahy Scale: Good     Standing balance support: Bilateral upper extremity supported, During functional activity, Reliant on assistive device for balance Standing balance-Leahy Scale: Poor Standing balance comment: relies on RW                            Communication Communication Communication: No apparent difficulties  Cognition Arousal: Alert Behavior During Therapy: WFL for tasks assessed/performed   PT - Cognitive impairments: No apparent impairments  Following commands: Intact      Cueing Cueing Techniques: Verbal cues  Exercises      General Comments        Pertinent Vitals/Pain Pain Assessment Pain Assessment: Faces Faces Pain Scale: Hurts even  more Pain Location: Back Pain Descriptors / Indicators: Discomfort, Aching, Operative site guarding Pain Intervention(s): Monitored during session, Limited activity within patient's tolerance, Repositioned     PT Goals (current goals can now be found in the care plan section) Acute Rehab PT Goals Patient Stated Goal: Return Home PT Goal Formulation: With patient Time For Goal Achievement: 07/28/24 Progress towards PT goals: Progressing toward goals    Frequency    Min 5X/week       AM-PAC PT 6 Clicks Mobility   Outcome Measure  Help needed turning from your back to your side while in a flat bed without using bedrails?: A Little Help needed moving from lying on your back to sitting on the side of a flat bed without using bedrails?: A Little Help needed moving to and from a bed to a chair (including a wheelchair)?: A Little Help needed standing up from a chair using your arms (e.g., wheelchair or bedside chair)?: A Little Help needed to walk in hospital room?: A Little Help needed climbing 3-5 steps with a railing? : A Lot 6 Click Score: 17    End of Session Equipment Utilized During Treatment: Gait belt Activity Tolerance: Patient tolerated treatment well Patient left: in chair;with call bell/phone within reach Nurse Communication: Mobility status PT Visit Diagnosis: Pain;Difficulty in walking, not elsewhere classified (R26.2);Other abnormalities of gait and mobility (R26.89);Unsteadiness on feet (R26.81)     Time: 9160-9143 PT Time Calculation (min) (ACUTE ONLY): 17 min  Charges:    $Gait Training: 8-22 mins PT General Charges $$ ACUTE PT VISIT: 1 Visit                     Darryle George, PTA Acute Rehabilitation Services Secure Chat Preferred  [phone]    Darryle George 07/15/2024, 9:05 AM

## 2024-07-15 NOTE — Discharge Instructions (Signed)
Can remove transparent dressing and shower 48 hours after surgery Walk as much as possible No heavy lifting >10 lbs No excessive bending/twisting at the waist  

## 2024-08-02 NOTE — Progress Notes (Signed)
 PATIENT: Gregory Hinton DOB: [birth year]  REASON FOR VISIT: follow up HISTORY FROM: patient  Virtual Visit via MyChart video  I connected with Gregory Hinton on 08/07/24 at  2:00 PM EST via MyChart video and verified that I am speaking with the correct person using two identifiers.   I discussed the limitations, risks, security and privacy concerns of performing an evaluation and management service by Mychart video and the availability of in person appointments. I also discussed with the patient that there may be a patient responsible charge related to this service. The patient expressed understanding and agreed to proceed.   History of Present Illness:  08/07/24 ALL (MyChart): Gregory Hinton for follow up for CIDP. He was last seen by me 01/2024. He had felt IVIG was helping but experienced more weakness. He was having hip pain and seeing ortho. Dr Colon following for chronic back pain s/p multiple surgeries.   Since, he reports doing well with bilateral hand strengths and numbness. Significantly improved on IVIG. Lower extremity weakness continues to be problematic. He is s/p thoracic fusion 3 weeks ago with Dr Colon. He has not noted any significant improvement in leg strength. In fact, he feels strength may be worse. He is due to next IVIG infusion, tomorrow. He is hoping to note some benefit following infusion. He is planning another MRI next week to evaluate post surgery. He has continued prednisone  15mg  daily. He uses Rolator for stability. He has returned to work 3-4 hours a day.   01/13/2024 ALL:  Gregory Hinton is a 58 y.o. male here today for follow up for CIDP diagnosed with Dr Onita through [facility] 08/2022 and started on IVIG loading dose of 2g/kg 11/2022 and continued maintenance dose of 1g/kg since. Prednisone  started at 60mg  daily and has been tapered down. He was last seen 06/2023 and doing well.    He had last infusion yesterday. He continues prednisone  15mg  daily. He feels hand  strength and numbness is improving. Infusions seemed to help with leg weakness, originally, but recently (last 2-3 months) he hasn't noticed much improvement. Feet remain numb. He has had 5 previous back surgeries.   He is concerned leg weakness may be related to significant right hip pain. He is having lumbar and right hip MRIs performed tomorrow.   He usually wears ankle braces if not using Rolator. Last fall about 1.5 months ago. He seems to always land on left side. He denies injuries. PT was previously helpful.   He feels cognitive functioning has significantly. He continues to work 7 days a week. He builds race cars.he is completely independent with ADLs and medication administration. He drives without difficulty.   HISTORY (copied from Dr Georgianne previous note)  Gregory Hinton is a 58 year old male, seen in request by neurosurgeon neurosurgeon Dr. Colon Shove, for evaluation of gradual onset arm and leg weakness, his primary care is at [facility] nodal, Mickey Browner, initial evaluation August 03, 2022   I reviewed and summarized the referring note. PMHX. Adrenal insufficiency HTN Anxiety Rheumatoid arthritis Lumbar decompression x3, last was in 2022,  CTS release bilateral Left ulnar decompression in 2017.   Patient was diagnosed with adrenal insufficiency around 2016, felt terrible then, lack of energy, laboratory evaluation confirmed adrenal insufficiency, he has been treated with prednisone  5 to 10 mg daily, does make a difference, currently taking 5 mg twice a day   Also had longstanding history of rheumatoid arthritis, multiple joints pain, moderate to severe bilateral  shoulder pain, multiple hand joints pain,    He works as a cabin crew, including heavy lifting, working on sales promotion account executive   Since January 2023, he noticed bilateral foot numbness tingling, as if walking on cottons, gradually getting worse, around spring 2023, he also noticed bilateral arm and leg  weakness, need push-up to get up from seated position, hard to get around in his workshop, fell few times,   He had 3 lumbar decompression surgery in the past, personally reviewed MRI lumbar spine September 2022, interval L2-3 fusion with moderate residual spinal stenosis, new adjacent segment disease at L1-2 with severe spinal stenosis and moderate neuroforaminal stenosis   He since underwent posterior decompression L1 to with laminectomy and posterior lumbar interbody arthrodesis, segmental fixation L1-4, by Dr. 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,   Patient complains of slow onset, progressive worsening gait abnormality over past 1 year, to the point of at high risk for fall, difficult to get around at his workshop, also complains of deep body achy pain   Reviewed extensive laboratory evaluation October 2023, normal negative ESR C-reactive protein B12, RPR, HIV, ANA, thyroid  panel, acetylcholine receptor antibody, protein electrophoresis,   Lipid panel LDL 98, A1c 5.7   UPDATE Feb 03 2023: He was started with IVIG around February 2024 tolerating it well, did notice some improvement, less numbness, more awakening bilateral feet numb, tingling, difficulty falling to sleep, Cymbalta  60 mg every morning help his symptoms some   Spinal fluid testing December 2023 showed total protein of 142   He is on tapering dose of prednisone , intended to be 10 mg decrement every month, he is adjusting decrement rate based on his  feeling, aiming to be 10 mg daily at next visit in 3 months, he does complains side effect of water  retention, moon face weight gain, moodiness   He fell few times, over the past 6 months, has worsening urinary urgency, incontinence, even lost control of bowel movement, have accidents sometimes, also noticed increased bilateral hands numbness tingling, and weakness   Personally reviewed MRI of cervical spine, September 2023, multilevel degenerative changes, no significant canal foraminal narrowing   UPDATE Sept 19 2024: He has bilateral ankle braces, walk better, has not fell, finger can hold on things better to, tolerating IVIG   Recent mild decreased hemoglobin, going through GI workup  Observations/Objective:  Generalized: Well developed, in no acute distress  Mentation: Alert oriented to time, place, history taking. Follows all commands speech and language fluent  Unable to visualize lower ext motor movement as he was in his car. Upper extremities move freely.    Assessment and Plan:  58 y.o. year old male  has a past medical history of Adrenal insufficiency, Anemia, Anxiety, Arthritis, Back pain, CIDP (chronic inflammatory demyelinating polyneuropathy) (HCC), Coronary artery disease, H/O cardiovascular stress test (2014), Hypertension, PONV (postoperative nausea and vomiting), and Rheumatoid arthritis (HCC). here with    ICD-10-CM   1. CIDP (chronic inflammatory demyelinating polyneuropathy) (HCC)  G61.81     2. Weakness  R53.1     3.  Neuropathy  G62.9     4. Gait abnormality  R26.9      [place] is doing fair. He reports about 95% improvement in upper ext weakness but continues to have significant weakness of lower extremities. He is s/p thoracic fusion and feels weakness has not improved. We will continue Octagam monthly split in two doses and prednisone  15mg  daily. He will continue to wean prednisone  but continue at least 10mg  daily per endocrinology. May consider  Vyvgart Hytrulo in future due to his busy schedule. He has returned to work part time. He will continue close follow up with Dr Colon. Fall precautions advised. Healthy lifestyle habits encouraged. He will follow up with care team as directed. He will return to see Dr Onita in 3-4 months, sooner if needed. He verbalizes understanding and agreement with this plan.    No orders of the defined types were placed in this encounter.   No orders of the defined types were placed in this encounter.    Follow Up Instructions:  I discussed the assessment and treatment plan with the patient. The patient was provided an opportunity to ask questions and all were answered. The patient agreed with the plan and demonstrated an understanding of the instructions.   The patient was advised to call back or seek an in-person evaluation if the symptoms worsen or if the condition fails to improve as anticipated.  I provided 20 minutes of face-to-face and non face-to-face time during this MyChart video encounter. Patient located at their place of residence. Provider is in the office.    Jaselle Pryer, NP

## 2024-08-02 NOTE — Patient Instructions (Signed)
 Below is our plan:  We will continue IVIG monthly and prednisone  15mg  daily. We may consider switching you to a different medication pending recover from surgery.   Please make sure you are staying well hydrated. I recommend 50-60 ounces daily. Well balanced diet and regular exercise encouraged. Consistent sleep schedule with 6-8 hours recommended.   Please continue follow up with care team as directed.   Follow up with Dr Onita in 3-4 months.   You may receive a survey regarding today's visit. I encourage you to leave honest feed back as I do use this information to improve patient care. Thank you for seeing me today!

## 2024-08-03 ENCOUNTER — Other Ambulatory Visit: Payer: Self-pay | Admitting: Neurological Surgery

## 2024-08-03 ENCOUNTER — Encounter: Payer: Self-pay | Admitting: Neurological Surgery

## 2024-08-03 DIAGNOSIS — M4714 Other spondylosis with myelopathy, thoracic region: Secondary | ICD-10-CM

## 2024-08-07 ENCOUNTER — Encounter: Payer: Self-pay | Admitting: Family Medicine

## 2024-08-07 ENCOUNTER — Telehealth: Admitting: Family Medicine

## 2024-08-07 DIAGNOSIS — R531 Weakness: Secondary | ICD-10-CM | POA: Diagnosis not present

## 2024-08-07 DIAGNOSIS — R269 Unspecified abnormalities of gait and mobility: Secondary | ICD-10-CM

## 2024-08-07 DIAGNOSIS — G6181 Chronic inflammatory demyelinating polyneuritis: Secondary | ICD-10-CM

## 2024-08-07 DIAGNOSIS — G629 Polyneuropathy, unspecified: Secondary | ICD-10-CM | POA: Diagnosis not present

## 2024-08-10 ENCOUNTER — Ambulatory Visit
Admission: RE | Admit: 2024-08-10 | Discharge: 2024-08-10 | Disposition: A | Discharge status: Home / Self Care | Source: Ambulatory Visit | Attending: Neurological Surgery | Admitting: Neurological Surgery

## 2024-08-10 DIAGNOSIS — M4714 Other spondylosis with myelopathy, thoracic region: Secondary | ICD-10-CM

## 2024-10-13 ENCOUNTER — Other Ambulatory Visit (HOSPITAL_COMMUNITY): Payer: Self-pay

## 2024-11-03 ENCOUNTER — Other Ambulatory Visit: Payer: Self-pay | Admitting: Neurological Surgery

## 2024-11-03 ENCOUNTER — Other Ambulatory Visit (HOSPITAL_COMMUNITY): Payer: Self-pay | Admitting: Neurological Surgery

## 2024-11-03 DIAGNOSIS — M4714 Other spondylosis with myelopathy, thoracic region: Secondary | ICD-10-CM

## 2024-11-06 ENCOUNTER — Other Ambulatory Visit (HOSPITAL_COMMUNITY): Payer: Self-pay | Admitting: Neurological Surgery

## 2024-11-06 DIAGNOSIS — M4714 Other spondylosis with myelopathy, thoracic region: Secondary | ICD-10-CM

## 2024-11-22 ENCOUNTER — Other Ambulatory Visit (HOSPITAL_COMMUNITY)

## 2024-11-22 ENCOUNTER — Ambulatory Visit (HOSPITAL_COMMUNITY)

## 2024-12-26 ENCOUNTER — Ambulatory Visit: Admitting: Neurology
